# Patient Record
Sex: Female | Born: 1965 | Hispanic: Yes
Health system: Midwestern US, Community
[De-identification: ages and names within clinical notes are randomized; demographics above are authoritative.]

## PROBLEM LIST (undated history)

## (undated) DIAGNOSIS — F1020 Alcohol dependence, uncomplicated: Secondary | ICD-10-CM

## (undated) DIAGNOSIS — F319 Bipolar disorder, unspecified: Secondary | ICD-10-CM

## (undated) DIAGNOSIS — F4310 Post-traumatic stress disorder, unspecified: Secondary | ICD-10-CM

## (undated) DIAGNOSIS — Z9884 Bariatric surgery status: Secondary | ICD-10-CM

## (undated) DIAGNOSIS — F32A Depression, unspecified: Secondary | ICD-10-CM

## (undated) DIAGNOSIS — G5603 Carpal tunnel syndrome, bilateral upper limbs: Secondary | ICD-10-CM

## (undated) DIAGNOSIS — F329 Major depressive disorder, single episode, unspecified: Secondary | ICD-10-CM

## (undated) DIAGNOSIS — F419 Anxiety disorder, unspecified: Secondary | ICD-10-CM

## (undated) DIAGNOSIS — F3181 Bipolar II disorder: Secondary | ICD-10-CM

## (undated) HISTORY — PX: GASTRIC BYPASS: SHX52

---

## 2009-02-15 ENCOUNTER — Ambulatory Visit

## 2009-02-15 LAB — AMB POC EKG ROUTINE W/ 12 LEADS, INTER & REP

## 2009-02-15 NOTE — Progress Notes (Signed)
Here for well woman exam no other complaints

## 2009-02-15 NOTE — Progress Notes (Signed)
HISTORY OF PRESENT ILLNESS  Rhonda Warner is a 43 y.o. female.  HPI  SUBJECTIVE:   43 y.o. female for annual wellness exam.  Additional Concerns: needs labs drawn and EKG performed for prescription monitoring for psychiatrist     Past pap smear results: normal   Past Mammograms: normal 3 years ago  Patient's last menstrual period was 02/02/2009.    Dental: due  Immunizations: Tdap due  Safe at home: yes       Cardiac Risk Factors  Age > 45-female, > 55-female:  NO  Smoking:     NO  Sig. family hx of CHD*:  NO  Hypertension:     NO  Diabetes:      NO        Social History: single partner.    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 ziprasidone (GEODON) 40 mg capsule Take 40 mg by mouth two (2) times daily (with meals).       ??? hydrOXYzine (VISTARIL) 25 mg capsule Take 25 mg by mouth three (3) times daily as needed.       ??? lamotrigine (LAMICTAL) 200 mg tablet Take  by mouth daily.       ??? venlafaxine-SR (EFFEXOR XR) 150 mg capsule Take  by mouth daily.           No Known Allergies  Past Medical History   Diagnosis Date   ??? Bipolar 2 disorder    ??? 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 Hx gastric bypass 01/2002   ??? Hx carpal tunnel release 09/1997       Family History   Problem Relation   ??? Alcohol abuse Mother   ??? Cancer Neg Hx       History   Substance Use Topics   ??? Tobacco Use: Never   ??? Alcohol Use: No          Review of Systems   Constitutional: Negative for malaise/fatigue.    ROS:  Feeling well. No dyspnea or chest pain on exertion.  No abdominal pain, change in bowel habits, black or bloody stools.  No urinary tract symptoms. GYN ROS: normal menses, no abnormal bleeding, pelvic pain or discharge, no breast pain or new or enlarging lumps on self exam. No neurological complaints.    BP 149/101   Pulse 80   Temp(Src) 98.1 ??F (36.7 ??C) (Oral)   Resp 18   Ht 5\' 6"  (1.676 m)   Wt 250 lb (113.399 kg)   LMP 02/02/2009   Physical Exam   Constitutional: She is oriented to person, place, and time. She appears well-developed. No distress.        Pleasant morbidly obese white female   HENT:   Head: Normocephalic and atraumatic.   Eyes: Conjunctivae and extraocular motions are normal.   Neck: Normal range of motion. Neck supple. No thyromegaly present.   Cardiovascular:  Normal rate, regular rhythm and normal heart sounds.  Exam reveals no gallop and no friction rub.    No murmur heard.  Pulmonary/Chest: Effort normal and breath sounds normal. No respiratory distress. She has no wheezes. She has no rales. Right breast exhibits no inverted nipple, no mass, no nipple discharge, no skin change and no tenderness. Left breast exhibits no inverted nipple, no mass, no nipple discharge, no skin change and no tenderness. Breasts are symmetrical.   Abdominal: Soft. Bowel sounds are normal. No tenderness.        Limited by habitus   Genitourinary: Vagina normal. Pelvic exam was performed with patient supine. No labial fusion. There is no lesion on the right labia. There is no lesion on the left labia. Uterus is not tender. Cervix exhibits no motion tenderness, no discharge and no friability. No erythema or bleeding around the vagina. No discharge found.        Bimanual exam was performed but extremely limited by habitus   Lymphadenopathy:     She has no cervical adenopathy.     She has no axillary adenopathy.        Right: No inguinal adenopathy present.        Left: No inguinal adenopathy present.    Neurological: She is alert and oriented to person, place, and time.   Psychiatric: She has a normal mood and affect. Her behavior is normal. Judgment and thought content normal.     EKG today is normal  ASSESSMENT and PLAN  1. Well woman exam (V70.0L)  MAM MAMMOGRAM SCREENING DIGITAL BILAT, PAP, LIQUID BASED, IMAGE PROCESSED, LIPID PANEL, METABOLIC PANEL, COMPREHENSIVE, VITAMIN D, 25 HYDROXY   2. Morbid obesity (278.01)  LIPID PANEL, METABOLIC PANEL, COMPREHENSIVE, VITAMIN D, 25 HYDROXY, TSH W/ REFLX FREE T4 IF ABNORMAL   3. S/P gastric bypass (V45.86N)  TSH W/ REFLX FREE T4 IF ABNORMAL, VITAMIN B12 & FOLATE, IRON PROFILE, FERRITIN   4. Need for Tdap vaccination (V06.1P)     5. Bipolar 2 disorder (296.63M)  ziprasidone (GEODON) 40 mg capsule, hydrOXYzine (VISTARIL) 25 mg capsule, LIPID PANEL, METABOLIC PANEL, COMPREHENSIVE, CBC W/O DIFF, VITAMIN D, 25 HYDROXY, TSH W/ REFLX FREE T4 IF ABNORMAL, AMB POC EKG ROUTINE W/ 12 LEADS, INTER & REP   6. Elevated blood pressure (796.2S)  No prior history.  Reassess next visit.     The patient understands our medical plan.  Alternatives have been explained and offered.   All questions have been answered.  She is encouraged to visit [facility] for additional information using the codes listed in the after visit summary, which was reviewed.      Follow-up Disposition:  Return in about 2 weeks (around 03/01/2009) for blood pressure recheck and lab follow up.

## 2009-02-15 NOTE — Patient Instructions (Addendum)
Please review the labs ordered compared to those requested by your psychiatrist.  We may be able to add any that have been overlooked with a phone call.    Please call in your psychiatrist's contact information for [place] to fax your results.    Schedule a dental examination.[place]   Spanish  Well Visit???Ages 5 to 29: After Your Visit  Your Care Instructions  Physical exams can help you stay healthy. Your doctor has checked your overall health and may have suggested ways to take good care of yourself. He or she also may have recommended tests. At home, you can help prevent illness with healthy eating, regular exercise, and other steps.  Follow-up care is a key part of your treatment and safety. Be sure to make and go to all appointments, and call your doctor if you are having problems. It???s also a good idea to know your test results and keep a list of the medicines you take.  How can you care for yourself at home?  ?? Reach and stay at a healthy weight. This will lower your risk for many problems, such as obesity, diabetes, heart disease, and high blood pressure.   ?? Get at least 30 minutes of physical activity on most days of the week. Walking is a good choice. You also may want to do other activities, such as running, swimming, cycling, or playing tennis or team sports. Discuss any changes in your exercise program with your doctor.   ?? Do not smoke or allow others to smoke around you. If you need help quitting, talk to your doctor about stop-smoking programs and medicines. These can increase your chances of quitting for good.   ?? Talk to your doctor about whether you have any risk factors for sexually transmitted diseases (STDs). Having one sex partner (who does not have STDs and does not have sex with anyone else) is a good way to avoid these diseases.   ?? Use birth control if you do not want to have children at this time. Talk with your doctor about the choices available and what might be best for you.    ?? Always wear sunscreen on exposed skin. Make sure the sunscreen blocks ultraviolet rays (both UVA and UVB) and has a sun protection factor (SPF) of at least 15. Use it every day, even when it is cloudy. Some doctors may recommend a higher SPF, such as 30.   ?? See a dentist one or two times a year for checkups and to have your teeth cleaned.   ?? Wear a seat belt in the car.   ?? Drink alcohol in moderation, if at all. That means no more than 2 drinks a day for men and 1 drink a day for women.  Follow your doctor's advice about when to have certain tests. These tests can spot problems early.  For everyone  ?? Cholesterol. Have the fat (cholesterol) in your blood tested after age 86. Your doctor will tell you how often to have this done based on your age, family history, or other things that can increase your risk for heart disease.   ?? Blood pressure. Experts suggest that healthy adults with normal blood pressure (119/79 mm Hg or below) have their blood pressure checked at least every 1 to 2 years. This can be done during a routine doctor visit. If you have slightly higher or high blood pressure, your doctor will suggest more frequent tests.   ?? Vision. Have your eyes checked every  year or two or as often as your doctor suggests. Some experts recommend that you have yearly exams for glaucoma and other age-related eye problems starting at age 22.   ?? Diabetes. Ask your doctor whether you should have tests for diabetes.   ?? Colon cancer. Have a test for colon cancer at age 14. You may have one of several tests. If you are younger than 33, you may need a test earlier if you have any risk factors. Risk factors include whether you already had a precancerous polyp removed from your colon or whether your parent, brother, sister, or child has had colon cancer.  For women   ?? Breast exam and mammogram. Talk to your doctor about when you should have a clinical breast exam and a mammogram. Medical experts differ on whether and how often women under 50 should have these tests. Your doctor can help you decide what is right for you.   ?? Pap test and pelvic exam. Begin Pap tests 3 years from the time you first have sexual intercourse, but no later than age 51. A Pap test is the best way to find cervical cancer. The test often is part of a pelvic exam. Ask how often to have this test.   ?? Tests for sexually transmitted diseases (STDs). Ask whether you should have tests for STDs. You may be at risk if you have sex with more than one person, especially if your partners do not wear condoms.  For men  ?? Tests for sexually transmitted diseases (STDs). Ask whether you should have tests for STDs. You may be at risk if you have sex with more than one person, especially if you do not wear a condom.   ?? Testicular cancer exam. Ask your doctor whether you should check your testicles regularly.   ?? Prostate exam. Talk to your doctor about whether you should have a blood test (called a PSA test) for prostate cancer. Experts differ on whether and when men should have this test. Some experts suggest it if you are older than 49 and are African-American or have a father or brother who got prostate cancer when he was younger than 59.  When should you call for help?  Watch closely for changes in your health, and be sure to contact your doctor if:  ?? You have any problems or symptoms that concern you.   ?? You want more information on taking care of yourself.   Where can you learn more?   Go to [email or web address].  Enter P072 in the search box to learn more about "Well Visit???Ages 61 to 14: After Your Visit."    ?? 2006-2010 [employer]. Care instructions adapted under license by [employer] (which disclaims liability or warranty for this information). This care instruction is for use with your licensed healthcare professional. If you have questions about a medical condition or this instruction, always ask your healthcare professional. Healthwise disclaims any warranty or liability for your use of this information.      Please call the clinic if you haven't received a call with the results of your tests or with an appointment plan for any referrals within one week.  No news is not good news; it's no news.     Please review our list of your current medications (name, formulation, strength and dosing instructions) and allergies and call us if there is an error.      Please bring all of your over the counter and herbal supplements to  your next visit if you have not already done so.

## 2009-02-16 LAB — METABOLIC PANEL, COMPREHENSIVE
A-G Ratio: 1.3 (ref 0.8–1.7)
ALT (SGPT): 35 U/L (ref 30–65)
AST (SGOT): 28 U/L (ref 15–37)
Albumin: 3.9 g/dL (ref 3.4–5.0)
Alk. phosphatase: 112 U/L (ref 50–136)
Anion gap: 8 mmol/L (ref 5–15)
BUN/Creatinine ratio: 12 (ref 12–20)
BUN: 11 MG/DL (ref 7–18)
Bilirubin, total: 0.5 MG/DL (ref 0.1–0.9)
CO2: 29 MMOL/L (ref 21–32)
Calcium: 8.8 MG/DL (ref 8.4–10.4)
Chloride: 98 MMOL/L — ABNORMAL LOW (ref 100–108)
Creatinine: 0.9 MG/DL (ref 0.6–1.3)
GFR est AA: >60 mL/min/{1.73_m2} (ref >60)
GFR est non-AA: >60 mL/min/{1.73_m2} (ref >60)
Globulin: 3.1 g/dL (ref 2.0–4.0)
Glucose: 108 MG/DL — ABNORMAL HIGH (ref 74–99)
Potassium: 4.2 MMOL/L (ref 3.5–5.5)
Protein, total: 7 g/dL (ref 6.4–8.2)
Sodium: 135 MMOL/L — ABNORMAL LOW (ref 136–145)

## 2009-02-16 LAB — CBC W/O DIFF
HCT: 37 % (ref 36.0–46.0)
HGB: 12.2 g/dL (ref 12.0–16.0)
MCH: 28.7 PG (ref 25.0–35.0)
MCHC: 33.1 g/dL (ref 31.0–37.0)
MCV: 86.6 FL (ref 78.0–102.0)
MPV: 7.3 FL — ABNORMAL LOW (ref 7.4–10.4)
PLATELET: 253 10*3/uL (ref 130–400)
RBC: 4.26 M/uL (ref 4.10–5.10)
RDW: 17.5 % — ABNORMAL HIGH (ref 11.5–14.5)
WBC: 6.4 10*3/uL (ref 4.5–13.0)

## 2009-02-16 LAB — TSH W/ REFLX FREE T4 IF ABNORMAL: TSH: 1.86 u[IU]/mL (ref 0.51–6.27)

## 2009-02-16 LAB — LIPID PANEL
CHOL/HDL Ratio: 2.2 (ref 0–5.0)
Cholesterol, total: 185 MG/DL (ref 0–200)
HDL Cholesterol: 85 MG/DL — ABNORMAL HIGH (ref 40–60)
LDL, calculated: 71.8 MG/DL (ref 0–100)
LDL/HDL Ratio: 0.8
Triglyceride: 141 MG/DL (ref 0–150)
VLDL, calculated: 28.2 MG/DL

## 2009-02-16 LAB — IRON PROFILE
Iron % saturation: 30 % (ref 20–50)
Iron: 131 ug/dL (ref 35–150)
TIBC: 444 ug/dL (ref 250–450)

## 2009-02-16 LAB — VITAMIN B12 & FOLATE
Folate: >24 ng/mL — ABNORMAL HIGH (ref 5.38–24.0)
Vitamin B12: 404 pg/mL (ref 211–911)

## 2009-02-16 LAB — FERRITIN: Ferritin: 20 NG/ML (ref 10–291)

## 2009-02-17 LAB — VITAMIN D, 25 HYDROXY: Vitamin D 25-Hydroxy: 23 ng/mL — ABNORMAL LOW (ref 30–80)

## 2009-02-20 NOTE — Progress Notes (Signed)
Quick Note:    I see that she has an appointment to see me on 7/30. Please fax a copy of labs to her psychiatrist. Please fax her EKG as well if that's not already been done. Thank you.  ______

## 2014-07-12 ENCOUNTER — Encounter (HOSPITAL_COMMUNITY): Payer: Self-pay | Admitting: Emergency Medicine

## 2014-07-12 ENCOUNTER — Emergency Department (EMERGENCY_DEPARTMENT_HOSPITAL)
Admission: EM | Admit: 2014-07-12 | Discharge: 2014-07-13 | Disposition: A | Discharge status: Other Acute Inpatient Hospital | Payer: Medicare Other | Source: Home / Self Care | Attending: Emergency Medicine | Admitting: Emergency Medicine

## 2014-07-12 DIAGNOSIS — F319 Bipolar disorder, unspecified: Secondary | ICD-10-CM | POA: Insufficient documentation

## 2014-07-12 DIAGNOSIS — Z79899 Other long term (current) drug therapy: Secondary | ICD-10-CM

## 2014-07-12 DIAGNOSIS — F314 Bipolar disorder, current episode depressed, severe, without psychotic features: Secondary | ICD-10-CM

## 2014-07-12 DIAGNOSIS — F10129 Alcohol abuse with intoxication, unspecified: Secondary | ICD-10-CM | POA: Insufficient documentation

## 2014-07-12 DIAGNOSIS — F10920 Alcohol use, unspecified with intoxication, uncomplicated: Secondary | ICD-10-CM

## 2014-07-12 DIAGNOSIS — Z3202 Encounter for pregnancy test, result negative: Secondary | ICD-10-CM | POA: Insufficient documentation

## 2014-07-12 HISTORY — DX: Depression, unspecified: F32.A

## 2014-07-12 HISTORY — DX: Alcohol dependence, uncomplicated: F10.20

## 2014-07-12 HISTORY — DX: Major depressive disorder, single episode, unspecified: F32.9

## 2014-07-12 HISTORY — DX: Bipolar disorder, unspecified: F31.9

## 2014-07-12 LAB — COMPREHENSIVE METABOLIC PANEL
ALK PHOS: 100 U/L (ref 39–117)
ALT: 20 U/L (ref 0–35)
ANION GAP: 15 (ref 5–15)
AST: 38 U/L — ABNORMAL HIGH (ref 0–37)
Albumin: 3.2 g/dL — ABNORMAL LOW (ref 3.5–5.2)
BUN: 10 mg/dL (ref 6–23)
CALCIUM: 8.7 mg/dL (ref 8.4–10.5)
CO2: 29 mEq/L (ref 19–32)
Chloride: 102 mEq/L (ref 96–112)
Creatinine, Ser: 0.56 mg/dL (ref 0.50–1.10)
GFR calc Af Amer: >90 mL/min (ref >90)
GFR calc non Af Amer: >90 mL/min (ref >90)
Glucose, Bld: 112 mg/dL — ABNORMAL HIGH (ref 70–99)
Potassium: 4.3 mEq/L (ref 3.7–5.3)
Sodium: 146 mEq/L (ref 137–147)
TOTAL PROTEIN: 6.9 g/dL (ref 6.0–8.3)
Total Bilirubin: 0.2 mg/dL — ABNORMAL LOW (ref 0.3–1.2)

## 2014-07-12 LAB — RAPID URINE DRUG SCREEN, HOSP PERFORMED
Amphetamines: NOT DETECTED
Barbiturates: NOT DETECTED
Benzodiazepines: NOT DETECTED
Cocaine: NOT DETECTED
OPIATES: NOT DETECTED
TETRAHYDROCANNABINOL: NOT DETECTED

## 2014-07-12 LAB — CBC
HEMATOCRIT: 42.8 % (ref 36.0–46.0)
HEMOGLOBIN: 13.8 g/dL (ref 12.0–15.0)
MCH: 31 pg (ref 26.0–34.0)
MCHC: 32.2 g/dL (ref 30.0–36.0)
MCV: 96.2 fL (ref 78.0–100.0)
Platelets: 291 10*3/uL (ref 150–400)
RBC: 4.45 MIL/uL (ref 3.87–5.11)
RDW: 14.2 % (ref 11.5–15.5)
WBC: 8.8 10*3/uL (ref 4.0–10.5)

## 2014-07-12 LAB — ETHANOL: ALCOHOL ETHYL (B): 400 mg/dL — AB (ref 0–11)

## 2014-07-12 LAB — PREGNANCY, URINE: Preg Test, Ur: NEGATIVE

## 2014-07-12 LAB — SALICYLATE LEVEL

## 2014-07-12 LAB — ACETAMINOPHEN LEVEL: Acetaminophen (Tylenol), Serum: <15 ug/mL (ref 10–30)

## 2014-07-12 MED ORDER — NICOTINE 21 MG/24HR TD PT24
21.0000 mg | MEDICATED_PATCH | Freq: Every day | TRANSDERMAL | Status: DC
  Filled 2014-07-12: qty 1

## 2014-07-12 MED ORDER — GABAPENTIN 300 MG PO CAPS
300.0000 mg | ORAL_CAPSULE | Freq: Three times a day (TID) | ORAL | Status: DC
  Administered 2014-07-12 – 2014-07-13 (×3): 300 mg via ORAL
  Filled 2014-07-12 (×3): qty 1

## 2014-07-12 MED ORDER — ACETAMINOPHEN 325 MG PO TABS: 650.0000 mg | ORAL_TABLET | ORAL | Status: DC | PRN

## 2014-07-12 MED ORDER — LORAZEPAM 1 MG PO TABS: 0.0000 mg | ORAL_TABLET | Freq: Two times a day (BID) | ORAL | Status: DC

## 2014-07-12 MED ORDER — PRAZOSIN HCL 1 MG PO CAPS
1.0000 mg | ORAL_CAPSULE | Freq: Every day | ORAL | Status: DC
  Administered 2014-07-12: 1 mg via ORAL
  Filled 2014-07-12 (×2): qty 1

## 2014-07-12 MED ORDER — FLUOXETINE HCL 20 MG PO CAPS
20.0000 mg | ORAL_CAPSULE | Freq: Every day | ORAL | Status: DC
  Administered 2014-07-13: 20 mg via ORAL
  Filled 2014-07-12: qty 1

## 2014-07-12 MED ORDER — VALPROIC ACID 250 MG/5ML PO SYRP
125.0000 mg | ORAL_SOLUTION | Freq: Every day | ORAL | Status: DC
  Administered 2014-07-12: 125 mg via ORAL
  Filled 2014-07-12 (×2): qty 2.5

## 2014-07-12 MED ORDER — LITHIUM CITRATE 300 MG/5 ML PO SYRP
900.0000 mg | Freq: Every day | ORAL | Status: DC
  Administered 2014-07-12: 900 mg via ORAL
  Filled 2014-07-12 (×2): qty 15

## 2014-07-12 MED ORDER — ONDANSETRON HCL 4 MG PO TABS: 4.0000 mg | ORAL_TABLET | Freq: Three times a day (TID) | ORAL | Status: DC | PRN

## 2014-07-12 MED ORDER — LORAZEPAM 1 MG PO TABS
0.0000 mg | ORAL_TABLET | Freq: Four times a day (QID) | ORAL | Status: DC
  Administered 2014-07-12: 1 mg via ORAL
  Administered 2014-07-13 (×2): 2 mg via ORAL
  Filled 2014-07-12: qty 1
  Filled 2014-07-12 (×2): qty 2

## 2014-07-12 MED ORDER — LITHIUM CITRATE 300 MG/5 ML PO SYRP
15.0000 mg | Freq: Every day | ORAL | Status: DC
  Filled 2014-07-12: qty 0.25
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
## 2014-07-12 NOTE — ED Notes (Signed)
Contact # Jens SomJohn Brown [phone]
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
## 2014-07-12 NOTE — BH Assessment (Signed)
Assessment completed. Consulted Verne SpurrNeil Mashburn, PA-C who recommended that pt be re-evaluated in the am. Antony MaduraKelly Humes, PA-C has been informed of the recommendation.

## 2014-07-13 ENCOUNTER — Encounter (HOSPITAL_COMMUNITY): Payer: Self-pay

## 2014-07-13 ENCOUNTER — Inpatient Hospital Stay (HOSPITAL_COMMUNITY)
Admission: AD | Admit: 2014-07-13 | Discharge: 2014-07-18 | DRG: 885 | Disposition: A | Discharge status: Home / Self Care | Payer: Medicare Other | Source: Intra-hospital | Attending: Psychiatry | Admitting: Psychiatry

## 2014-07-13 DIAGNOSIS — F314 Bipolar disorder, current episode depressed, severe, without psychotic features: Principal | ICD-10-CM | POA: Diagnosis present

## 2014-07-13 DIAGNOSIS — F1099 Alcohol use, unspecified with unspecified alcohol-induced disorder: Secondary | ICD-10-CM

## 2014-07-13 DIAGNOSIS — F10229 Alcohol dependence with intoxication, unspecified: Secondary | ICD-10-CM | POA: Diagnosis present

## 2014-07-13 DIAGNOSIS — R45851 Suicidal ideations: Secondary | ICD-10-CM

## 2014-07-13 DIAGNOSIS — R51 Headache: Secondary | ICD-10-CM | POA: Diagnosis not present

## 2014-07-13 DIAGNOSIS — F1020 Alcohol dependence, uncomplicated: Secondary | ICD-10-CM | POA: Diagnosis present

## 2014-07-13 DIAGNOSIS — Z91410 Personal history of adult physical and sexual abuse: Secondary | ICD-10-CM | POA: Diagnosis not present

## 2014-07-13 DIAGNOSIS — W19XXXA Unspecified fall, initial encounter: Secondary | ICD-10-CM | POA: Diagnosis not present

## 2014-07-13 DIAGNOSIS — F4310 Post-traumatic stress disorder, unspecified: Secondary | ICD-10-CM | POA: Diagnosis present

## 2014-07-13 DIAGNOSIS — M542 Cervicalgia: Secondary | ICD-10-CM

## 2014-07-13 DIAGNOSIS — F10929 Alcohol use, unspecified with intoxication, unspecified: Secondary | ICD-10-CM | POA: Insufficient documentation

## 2014-07-13 DIAGNOSIS — Y92231 Patient bathroom in hospital as the place of occurrence of the external cause: Secondary | ICD-10-CM | POA: Diagnosis not present

## 2014-07-13 DIAGNOSIS — S0083XA Contusion of other part of head, initial encounter: Secondary | ICD-10-CM | POA: Diagnosis not present

## 2014-07-13 MED ORDER — LITHIUM CITRATE 300 MG/5 ML PO SYRP
900.0000 mg | Freq: Every day | ORAL | Status: DC
  Administered 2014-07-14 – 2014-07-17 (×4): 900 mg via ORAL
  Filled 2014-07-13: qty 45
  Filled 2014-07-13 (×7): qty 15

## 2014-07-13 MED ORDER — ONDANSETRON HCL 4 MG PO TABS
4.0000 mg | ORAL_TABLET | Freq: Three times a day (TID) | ORAL | Status: DC | PRN
  Filled 2014-07-13: qty 1

## 2014-07-13 MED ORDER — ACETAMINOPHEN 325 MG PO TABS
650.0000 mg | ORAL_TABLET | ORAL | Status: DC | PRN
  Administered 2014-07-15 – 2014-07-16 (×3): 650 mg via ORAL
  Filled 2014-07-13 (×4): qty 2

## 2014-07-13 MED ORDER — MAGNESIUM HYDROXIDE 400 MG/5ML PO SUSP
30.0000 mL | Freq: Every day | ORAL | Status: DC | PRN
  Filled 2014-07-13: qty 30

## 2014-07-13 MED ORDER — ACETAMINOPHEN 325 MG PO TABS: 650.0000 mg | ORAL_TABLET | Freq: Four times a day (QID) | ORAL | Status: DC | PRN

## 2014-07-13 MED ORDER — FLUOXETINE HCL 20 MG PO CAPS
20.0000 mg | ORAL_CAPSULE | Freq: Every day | ORAL | Status: DC
  Administered 2014-07-14 – 2014-07-18 (×5): 20 mg via ORAL
  Filled 2014-07-13 (×4): qty 1
  Filled 2014-07-13: qty 3
  Filled 2014-07-13 (×4): qty 1

## 2014-07-13 MED ORDER — LORAZEPAM 1 MG PO TABS
0.0000 mg | ORAL_TABLET | Freq: Two times a day (BID) | ORAL | Status: DC
  Administered 2014-07-14: 2 mg via ORAL

## 2014-07-13 MED ORDER — LORAZEPAM 1 MG PO TABS
0.0000 mg | ORAL_TABLET | Freq: Four times a day (QID) | ORAL | Status: DC
Start: 2014-07-14 — End: 2014-07-14
  Administered 2014-07-13: 2 mg via ORAL
  Filled 2014-07-13 (×2): qty 2

## 2014-07-13 MED ORDER — ALUM & MAG HYDROXIDE-SIMETH 200-200-20 MG/5ML PO SUSP
30.0000 mL | ORAL | Status: DC | PRN
  Filled 2014-07-13: qty 30

## 2014-07-13 MED ORDER — DIVALPROEX SODIUM 250 MG PO DR TAB
250.0000 mg | DELAYED_RELEASE_TABLET | Freq: Every day | ORAL | Status: DC
  Administered 2014-07-13: 250 mg via ORAL
  Filled 2014-07-13 (×5): qty 1

## 2014-07-13 MED ORDER — GABAPENTIN 300 MG PO CAPS
300.0000 mg | ORAL_CAPSULE | Freq: Three times a day (TID) | ORAL | Status: DC
  Administered 2014-07-14 – 2014-07-18 (×14): 300 mg via ORAL
  Filled 2014-07-13 (×8): qty 1
  Filled 2014-07-13: qty 9
  Filled 2014-07-13 (×3): qty 1
  Filled 2014-07-13: qty 9
  Filled 2014-07-13 (×5): qty 1
  Filled 2014-07-13: qty 9
  Filled 2014-07-13 (×5): qty 1

## 2014-07-13 MED ORDER — TRAZODONE HCL 100 MG PO TABS
100.0000 mg | ORAL_TABLET | Freq: Every day | ORAL | Status: DC
  Administered 2014-07-13 – 2014-07-17 (×4): 100 mg via ORAL
  Filled 2014-07-13 (×2): qty 1
  Filled 2014-07-13: qty 3
  Filled 2014-07-13 (×6): qty 1

## 2014-07-13 MED ORDER — PRAZOSIN HCL 1 MG PO CAPS
1.0000 mg | ORAL_CAPSULE | Freq: Every day | ORAL | Status: DC
  Administered 2014-07-13 – 2014-07-17 (×4): 1 mg via ORAL
  Filled 2014-07-13 (×2): qty 1
  Filled 2014-07-13: qty 3
  Filled 2014-07-13 (×6): qty 1
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
## 2014-07-13 NOTE — Tx Team (Signed)
Initial Interdisciplinary Treatment Plan   PATIENT STRESSORS: Marital or family conflict Substance abuse   PATIENT STRENGTHS: Capable of independent living Motivation for treatment/growth   PROBLEM LIST: Problem List/Patient Goals Date to be addressed Date deferred Reason deferred Estimated date of resolution  Depression with suicidal ideation       EToh dependence                                                 DISCHARGE CRITERIA:  Improved stabilization in mood, thinking, and/or behavior Need for constant or close observation no longer present Withdrawal symptoms are absent or subacute and managed without 24-hour nursing intervention  PRELIMINARY DISCHARGE PLAN: Attend 12-step recovery group Return to previous living arrangement  PATIENT/FAMIILY INVOLVEMENT: This treatment plan has been presented to and reviewed with the patient, Mary Harper, and/or family member, .  The patient and family have been given the opportunity to ask questions and make suggestions.  Mary Harper, Mary Harper 07/13/2014, 11:24 PM

## 2014-07-13 NOTE — ED Notes (Signed)
Pt transported to [facility] by [facility] transportation service for continuation of specialized care. She left in no acute distress. 

## 2014-07-13 NOTE — ED Notes (Signed)
Boyd KerbsPenny, RN [facility] at [facility]

## 2014-07-14 ENCOUNTER — Inpatient Hospital Stay (HOSPITAL_COMMUNITY): Payer: Medicare Other

## 2014-07-14 ENCOUNTER — Encounter (HOSPITAL_COMMUNITY): Payer: Self-pay | Admitting: Emergency Medicine

## 2014-07-14 DIAGNOSIS — F1020 Alcohol dependence, uncomplicated: Secondary | ICD-10-CM | POA: Diagnosis present

## 2014-07-14 DIAGNOSIS — F1099 Alcohol use, unspecified with unspecified alcohol-induced disorder: Secondary | ICD-10-CM

## 2014-07-14 DIAGNOSIS — F3130 Bipolar disorder, current episode depressed, mild or moderate severity, unspecified: Secondary | ICD-10-CM

## 2014-07-14 MED ORDER — LORAZEPAM 1 MG PO TABS: 1.0000 mg | ORAL_TABLET | Freq: Four times a day (QID) | ORAL | Status: AC | PRN

## 2014-07-14 MED ORDER — LORAZEPAM 1 MG PO TABS
1.0000 mg | ORAL_TABLET | Freq: Two times a day (BID) | ORAL | Status: AC
  Administered 2014-07-16 (×2): 1 mg via ORAL
  Filled 2014-07-14 (×2): qty 1

## 2014-07-14 MED ORDER — THIAMINE HCL 100 MG/ML IJ SOLN
100.0000 mg | Freq: Once | INTRAMUSCULAR | Status: AC
  Administered 2014-07-14: 100 mg via INTRAMUSCULAR

## 2014-07-14 MED ORDER — LOPERAMIDE HCL 2 MG PO CAPS
2.0000 mg | ORAL_CAPSULE | ORAL | Status: AC | PRN
  Filled 2014-07-14: qty 2

## 2014-07-14 MED ORDER — ADULT MULTIVITAMIN W/MINERALS CH
1.0000 | ORAL_TABLET | Freq: Every day | ORAL | Status: DC
  Administered 2014-07-14 – 2014-07-18 (×5): 1 via ORAL
  Filled 2014-07-14 (×8): qty 1

## 2014-07-14 MED ORDER — ONDANSETRON 4 MG PO TBDP
4.0000 mg | ORAL_TABLET | Freq: Four times a day (QID) | ORAL | Status: AC | PRN
  Administered 2014-07-14: 4 mg via ORAL
  Filled 2014-07-14 (×2): qty 1

## 2014-07-14 MED ORDER — LORAZEPAM 1 MG PO TABS
1.0000 mg | ORAL_TABLET | Freq: Four times a day (QID) | ORAL | Status: AC
  Administered 2014-07-14 (×4): 1 mg via ORAL
  Filled 2014-07-14 (×4): qty 1

## 2014-07-14 MED ORDER — VALPROIC ACID 250 MG/5ML PO SYRP
750.0000 mg | ORAL_SOLUTION | Freq: Every day | ORAL | Status: DC
  Administered 2014-07-14 – 2014-07-17 (×4): 750 mg via ORAL
  Filled 2014-07-14 (×5): qty 15
  Filled 2014-07-14: qty 45

## 2014-07-14 MED ORDER — LORAZEPAM 1 MG PO TABS
1.0000 mg | ORAL_TABLET | Freq: Three times a day (TID) | ORAL | Status: AC
  Administered 2014-07-15 (×3): 1 mg via ORAL
  Filled 2014-07-14 (×3): qty 1

## 2014-07-14 MED ORDER — HYDROXYZINE HCL 25 MG PO TABS
25.0000 mg | ORAL_TABLET | Freq: Four times a day (QID) | ORAL | Status: AC | PRN
  Filled 2014-07-14: qty 1

## 2014-07-14 MED ORDER — ACETAMINOPHEN 325 MG PO TABS
650.0000 mg | ORAL_TABLET | Freq: Once | ORAL | Status: DC
  Filled 2014-07-14: qty 2

## 2014-07-14 MED ORDER — VITAMIN B-1 100 MG PO TABS
100.0000 mg | ORAL_TABLET | Freq: Every day | ORAL | Status: DC
  Administered 2014-07-15 – 2014-07-18 (×4): 100 mg via ORAL
  Filled 2014-07-14 (×7): qty 1

## 2014-07-14 MED ORDER — LORAZEPAM 1 MG PO TABS
1.0000 mg | ORAL_TABLET | Freq: Every day | ORAL | Status: AC
  Administered 2014-07-17: 1 mg via ORAL
  Filled 2014-07-14: qty 1

## 2014-07-14 MED ORDER — ACETAMINOPHEN 325 MG PO TABS
650.0000 mg | ORAL_TABLET | Freq: Once | ORAL | Status: AC
  Administered 2014-07-14: 650 mg via ORAL

## 2014-07-14 NOTE — Progress Notes (Signed)
Psychoeducational Group Note  Date:  07/14/2014 Time:  2336  Group Topic/Focus:  Wrap-Up Group:   The focus of this group is to help patients review their daily goal of treatment and discuss progress on daily workbooks.  Participation Level: Did Not Attend  Participation Quality:  Not Applicable  Affect:  Not Applicable  Cognitive:  Not Applicable  Insight:  Not Applicable  Engagement in Group: Not Applicable  Additional Comments:  The patient did not attend group this evening since she was asleep in her bedroom.   Hazle CocaGOODMAN, Ailynn Gow S 07/14/2014, 11:36 PM

## 2014-07-14 NOTE — [facility] Suicide Risk Assessment (Signed)
Suicide Risk Assessment  Admission Assessment     Nursing information obtained from:    Demographic factors:    Current Mental Status:    Loss Factors:    Historical Factors:    Risk Reduction Factors:    Total Time spent with patient: 45 minutes  CLINICAL FACTORS:   Bipolar Disorder:   Depressive phase Alcohol/Substance Abuse/Dependencies  Psychiatric Specialty Exam:     Blood pressure 127/84, pulse 88, temperature 98 F (36.7 C), temperature source Oral, resp. rate 16, height 5\' 5"  (1.651 m), weight 92.987 kg (205 lb), last menstrual period 07/04/2014, SpO2 96 %.Body mass index is 34.11 kg/(m^2).  General Appearance: Fairly Groomed  Patent attorneyye Contact::  Fair  Speech:  Clear and Coherent  Volume:  Decreased  Mood:  Depressed  Affect:  Restricted  Thought Process:  Coherent and Goal Directed  Orientation:  Full (Time, Place, and Person)  Thought Content:  symptoms events worries concerns  Suicidal Thoughts:  Yes.  without intent/plan  Homicidal Thoughts:  No  Memory:  Immediate;   Fair Recent;   Fair Remote;   Fair  Judgement:  Fair  Insight:  Present  Psychomotor Activity:  Restlessness  Concentration:  Fair  Recall:  [employer]Fair  Fund of Knowledge:NA  Language: Fair  Akathisia:  No  Handed:    AIMS (if indicated):     Assets:  Desire for Improvement Housing Social Support  Sleep:  Number of Hours: 6   Musculoskeletal: Strength & Muscle Tone: within normal limits Gait & Station: normal Patient leans: N/A  COGNITIVE FEATURES THAT CONTRIBUTE TO RISK:  Closed-mindedness Polarized thinking Thought constriction (tunnel vision)    SUICIDE RISK:   Moderate:  Frequent suicidal ideation with limited intensity, and duration, some specificity in terms of plans, no associated intent, good self-control, limited dysphoria/symptomatology, some risk factors present, and identifiable protective factors, including available and accessible social support.  PLAN OF CARE: Supportive  approach/coping skills/relapsep prevention                               Detox as needed                               Reassess and address the co morbidities                               Optimize response to the psychotropics  I certify that inpatient services furnished can reasonably be expected to improve the patient's condition.  Leland Raver A 07/14/2014, 6:28 PM
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
## 2014-07-14 NOTE — Progress Notes (Signed)
Psychoeducational Group Note  Date:  07/14/2014 Time:  0140  Group Topic/Focus:  Recovery Goals:   The focus of this group is to identify appropriate goals for recovery and establish a plan to achieve them.  Participation Level: Did Not Attend  Participation Quality:  Not Applicable  Affect:  Not Applicable  Cognitive:  Not Applicable  Insight:  Not Applicable  Engagement in Group: Not Applicable  Additional Comments: The patient didn't attend the A.A. Meeting last evening since she had yet to be admitted to hallway.   Mertha Clyatt S 07/14/2014, 1:40 AM

## 2014-07-14 NOTE — ED Notes (Signed)
Bed: WA17 Expected date:  Expected time:  Means of arrival:  Comments: Fall from [facility]

## 2014-07-14 NOTE — ED Provider Notes (Signed)
The patient is a 48 year old female, currently a patient at behavioral health, she states that she gets dizzy with the medications that she takes and when she stood up too quickly she got dizzy and fell bumping her head on the side of the sink, she has a small hematoma to the left lateral forehead, no temporal or parietal injuries, no focal neurologic complaints or deficits on exam, mild amount of posterior cervical tenderness on exam. She was immobilized in a cervical collar, has normal strength and sensation of all 4 extremities. She will need imaging of her neck, no indication for CT scan of the brain at this time. Patient should be stable to go back to behavioral health if negative.  Medical screening examination/treatment/procedure(s) were conducted as a shared visit with non-physician practitioner(s) and myself.  I personally evaluated the patient during the encounter.  Clinical Impression:   Final diagnoses:  Neck pain  Fall, initial encounter  Forehead contusion, initial encounter         Vida RollerBrian D Laray Corbit, MD 07/14/14 [phone]

## 2014-07-14 NOTE — ED Notes (Addendum)
[facility] with episode of dizziness resulting in fall to sink and hematoma to left forehead. Pt denies LOC, alert and oriented x4, and NSR en route with EMS.

## 2014-07-14 NOTE — ED Provider Notes (Signed)
CSN: [id number]     Arrival date & time 07/14/14  1428 History   First MD Initiated Contact with Patient 07/14/14 1444     Chief Complaint  Patient presents with  . Fall   HPI  Patient is a [age] her old female who presents emergency room via EMS from [facility] for evaluation of a fall. Patient states that after lunch she was standing up from a toilet and got dizzy and slipped and fell and hit her left temple on the sink. She states that she never lost consciousness. Since falling she has had a headache and on scene EMS evaluated her neck and she had some neck pain. Patient was placed in a c-collar at the scene. Patient states that she gets dizzy spells when she stands up. She was told that it was related to her medications. Patient states that his dizzy spells not vertigo leg. She states that she felt woozy and swimmy headed. She states she is normally very careful about this, but got up and forgot. She states that she is not aware of any changes in her vision, but she normally wears glasses so her vision is currently blurry as if she does not have them on.  Past Medical History  Diagnosis Date  . Depression   . Bipolar 1 disorder   . Alcoholism    History reviewed. No pertinent past surgical history. History reviewed. No pertinent family history. History  Substance Use Topics  . Smoking status: Never Smoker   . Smokeless tobacco: Not on file  . Alcohol Use: Yes   OB History    No data available     Review of Systems  Constitutional: Negative for fever, chills and fatigue.  Eyes: Negative for photophobia and visual disturbance.  Respiratory: Negative for chest tightness and shortness of breath.   Cardiovascular: Negative for chest pain and palpitations.  Gastrointestinal: Negative for nausea, vomiting, abdominal pain, diarrhea and constipation.  Genitourinary: Negative for dysuria, urgency, frequency, hematuria and difficulty urinating.  Skin: Negative for rash.   Neurological: Positive for headaches. Negative for numbness.  Psychiatric/Behavioral: Negative for confusion.      Allergies  Review of patient's allergies indicates no known allergies.  Home Medications   Prior to Admission medications   Medication Sig Start Date End Date Taking? Authorizing Provider  FLUoxetine (PROZAC) 20 MG capsule Take 20 mg by mouth daily.    Historical Provider, MD  gabapentin (NEURONTIN) 300 MG capsule Take 300 mg by mouth 3 (three) times daily.    Historical Provider, MD  lithium citrate 300 MG/5ML solution Take 900 mg by mouth at bedtime.     Historical Provider, MD  prazosin (MINIPRESS) 1 MG capsule Take 1 mg by mouth at bedtime.    Historical Provider, MD  Valproic Acid (DEPAKENE) 250 MG/5ML SYRP syrup Take 750 mg by mouth at bedtime.     Historical Provider, MD   BP 135/82 mmHg  Pulse 94  Temp(Src) 98 F (36.7 C) (Oral)  Resp 20  Ht 5\' 5"  (1.651 m)  Wt 205 lb (92.987 kg)  BMI 34.11 kg/m2  SpO2 97%  LMP 07/04/2014 Physical Exam  Constitutional: She is oriented to person, place, and time. She appears well-developed and well-nourished. No distress.  HENT:  Head: Normocephalic.  Mouth/Throat: No oropharyngeal exudate.  There is a hematoma and ecchymosis to the left lateral forehead. Patient has tenderness to palpation over the left side of the head.  Eyes: Conjunctivae and EOM are normal. Pupils  are equal, round, and reactive to light. No scleral icterus.  Neck: Neck supple. No JVD present. No thyromegaly present.  Patient has tenderness to palpation over the midline of the neck. She can actively rotate her neck 45 and in each direction.  Cardiovascular: Normal rate, regular rhythm, normal heart sounds and intact distal pulses.  Exam reveals no gallop and no friction rub.   No murmur heard. Pulmonary/Chest: Effort normal and breath sounds normal. No respiratory distress. She has no wheezes. She has no rales. She exhibits no tenderness.  Abdominal:  Soft. Bowel sounds are normal. She exhibits no distension and no mass. There is no tenderness. There is no rebound and no guarding.  Lymphadenopathy:    She has no cervical adenopathy.  Neurological: She is alert and oriented to person, place, and time.  Skin: Skin is warm and dry. She is not diaphoretic.  Psychiatric: She has a normal mood and affect. Her behavior is normal. Judgment and thought content normal.  Nursing note and vitals reviewed.   ED Course  Procedures (including critical care time) Labs Review Labs Reviewed - No data to display  Imaging Review Dg Cervical Spine Complete  07/14/2014   CLINICAL DATA:  Neck pain. Pt went to the cafeteria today for her lunch and came back shaking and upset stating "it is too noisy and there are too many people down there. I cannot stand it. Pt was shaking but was able to hold her cup of water steady. Pt then went to her room with this writer escorting her and was tucked into her bed. Pt then got up to go to the bathroom and states "I got up from the toilet to wash my hands and I got dizzy. My head went down and hit the sink. I then turned around with my back up again the shower and lowered myself to the floor. I called out for help, but no one heard me. I saw the bell and pulled it and then people came to help me".  EXAM: CERVICAL SPINE  4+ VIEWS  COMPARISON:  None.  FINDINGS: No fracture. No spondylolisthesis. Minor loss of disc height at C5-C6 with endplate spurring. Remaining disc spaces are well preserved. Obliques are somewhat suboptimal, vertically for the left neural foramina. Allowing for this, there is no evidence of significant neural foraminal narrowing.  Soft tissues are unremarkable.  IMPRESSION: No fracture or acute finding.  Mild C5-C6 disc degenerative change.   Electronically Signed   By: Amie Portlandavid  Ormond M.D.   On: 07/14/2014 16:03     EKG Interpretation   Date/Time:  Saturday July 14 2014 15:02:28 EST Ventricular Rate:   79 PR Interval:  154 QRS Duration: 98 QT Interval:  390 QTC Calculation: 447 R Axis:   12 Text Interpretation:  Sinus rhythm Probable left ventricular hypertrophy  Abnormal ekg No old tracing to compare Confirmed by MILLER  MD, BRIAN  [phone]([id number]) on 07/14/2014 3:09:56 PM      MDM   Final diagnoses:  Neck pain  Fall, initial encounter  Forehead contusion, initial encounter   Patient is a 48 year old female with bipolar disorder and alcohol abuse who presents emergency room for evaluation after a fall from [facility]. Physical exam reveals a hematoma of the left forehead with no focal neurological deficits. Patient has some midline neck tenderness, but is able to actively rotate the head 45 in each direction. Given the [place] head CT trauma level patient is low risk for intracranial abnormalities. Head CT  is not indicated at this time. X-ray of the neck is negative. EKG is sinus rhythm with possible left ventricular hypertrophy. No other abnormalities noted on EKG. Patient is stable for transfer back to the behavioral health Hospital. Recommend Tylenol or ibuprofen as needed for headaches. Patient to return for changes in behavior, intractable nausea and vomiting, or any other concerning symptoms. Patient stable for discharge. Patient seen by and discussed with Dr. Hyacinth Meeker.    Eben Burow, PA-C 07/14/14 1611  Vida Roller, MD 07/14/14 [phone]

## 2014-07-14 NOTE — Progress Notes (Signed)
D) Pt went to the cafeteria today for her lunch and came back shaking and upset stating "it is too noisy and there are too many people down there. I cannot stand it. Pt was shaking but was able to hold her cup of water steady. Pt then went to her room with this writer escorting her and was tucked into her bed. Pt then got up to go to the bathroom and states "I got up from the toilet to wash my hands and I got dizzy. My head went down and hit the sink. I then turned around with my back up again the shower and lowered myself to the floor. I called out for help, but no one heard me. I saw the bell and pulled it and then people came to help me". A) Mary ColonelAggie Nwoko, NP was notified and due to Pt. Hitting her head an order was written for Pt to go and be evaluated in the ED. EMS was called and Pt was taken to the ED for evaluation. R) Pt tearful stating "I am stupid. I cannot believe that I did such a stupid thing". Pt given reassurance. Denies SI and HI. Rates her depression, hopelessness and anxiety all at a 7.

## 2014-07-14 NOTE — Discharge Instructions (Signed)
Fall Prevention in Hospitals As a hospital patient, your condition and the treatments you receive can increase your risk for falls. Some additional risk factors for falls in a hospital include:  Being in an unfamiliar environment.  Being on bed rest.  Your surgery.  Taking certain medicines.  Your tubing requirements, such as intravenous (IV) therapy or catheters. It is important that you learn how to decrease fall risks while at the hospital. Below are important tips that can help prevent falls. SAFETY TIPS FOR PREVENTING FALLS Talk about your risk of falling.  Ask your caregiver why you are at risk for falling. Is it your medicine, illness, tubing placement, or something else?  Make a plan with your caregiver to keep you safe from falls.  Ask your caregiver or pharmacist about side effect of your medicines. Some medicines can make you dizzy or affect your coordination. Ask for help.  Ask for help before getting out of bed. You may need to press your call button.  Ask for assistance in getting you safely to the toilet.  Ask for a walker or cane to be put at your bedside. Ask that most of the side rails on your bed be placed up before your caregiver leaves the room.  Ask family or friends to sit with you.  Ask for things that are out of your reach, such as your glasses, hearing aids, telephone, bedside table, or call button. Follow these tips to avoid falling:  Stay lying or seated, rather than standing, while waiting for help.  Wear rubber-soled slippers or shoes whenever you walk in the hospital.  Avoid quick, sudden movements.  Change positions slowly.  Sit on the side of your bed before standing.  Stand up slowly and wait before you start to walk.  Let your caregiver know if there is a spill on the floor.  Pay careful attention to the medical equipment, electrical cords, and tubes around you.  When you need help, use your call button by your bed or in the  bathroom. Wait for one of your caregivers to help you.  If you feel dizzy or unsure of your footing, return to bed and wait for assistance.  Avoid being distracted by the TV, telephone, or another person in your room.  Do not lean or support yourself on rolling objects, such as IV poles or bedside tables. Document Released: 07/17/2000 Document Revised: 07/06/2012 Document Reviewed: 03/27/2012 [facility] Patient Information 2015 [place], [place]. This information is not intended to replace advice given to you by your health care provider. Make sure you discuss any questions you have with your health care provider.   Contusion A contusion is a deep bruise. Contusions are the result of an injury that caused bleeding under the skin. The contusion may turn blue, purple, or yellow. Minor injuries will give you a painless contusion, but more severe contusions may stay painful and swollen for a few weeks.  CAUSES  A contusion is usually caused by a blow, trauma, or direct force to an area of the body. SYMPTOMS   Swelling and redness of the injured area.  Bruising of the injured area.  Tenderness and soreness of the injured area.  Pain. DIAGNOSIS  The diagnosis can be made by taking a history and physical exam. An X-ray, CT scan, or MRI may be needed to determine if there were any associated injuries, such as fractures. TREATMENT  Specific treatment will depend on what area of the body was injured. In general, the best  treatment for a contusion is resting, icing, elevating, and applying cold compresses to the injured area. Over-the-counter medicines may also be recommended for pain control. Ask your caregiver what the best treatment is for your contusion. HOME CARE INSTRUCTIONS   Put ice on the injured area.  Put ice in a plastic bag.  Place a towel between your skin and the bag.  Leave the ice on for 15-20 minutes, 3-4 times a day, or as directed by your health care provider.  Only take  over-the-counter or prescription medicines for pain, discomfort, or fever as directed by your caregiver. Your caregiver may recommend avoiding anti-inflammatory medicines (aspirin, ibuprofen, and naproxen) for 48 hours because these medicines may increase bruising.  Rest the injured area.  If possible, elevate the injured area to reduce swelling. SEEK IMMEDIATE MEDICAL CARE IF:   You have increased bruising or swelling.  You have pain that is getting worse.  Your swelling or pain is not relieved with medicines. MAKE SURE YOU:   Understand these instructions.  Will watch your condition.  Will get help right away if you are not doing well or get worse. Document Released: 04/29/2005 Document Revised: 07/25/2013 Document Reviewed: 05/25/2011 [facility] Hospital[facility] Patient Information 2015 [place]Care, [place]LLC. This information is not intended to replace advice given to you by your health care provider. Make sure you discuss any questions you have with your health care provider.   Emergency Department Resource Guide 1) Find a Doctor and Pay Out of Pocket Although you won't have to find out who is covered by your insurance plan, it is a good idea to ask around and get recommendations. You will then need to call the office and see if the doctor you have chosen will accept you as a new patient and what types of options they offer for patients who are self-pay. Some doctors offer discounts or will set up payment plans for their patients who do not have insurance, but you will need to ask so you aren't surprised when you get to your appointment.  2) Contact Your Local Health Department Not all health departments have doctors that can see patients for sick visits, but many do, so it is worth a call to see if yours does. If you don't know where your local health department is, you can check in your phone book. The CDC also has a tool to help you locate your state's health department, and many state websites also have  listings of all of their local health departments.  3) Find a Walk-in Clinic If your illness is not likely to be very severe or complicated, you may want to try a walk in clinic. These are popping up all over the country in pharmacies, drugstores, and shopping centers. They're usually staffed by nurse practitioners or physician assistants that have been trained to treat common illnesses and complaints. They're usually fairly quick and inexpensive. However, if you have serious medical issues or chronic medical problems, these are probably not your best option.  No Primary Care Doctor: - Call Health Connect at  [phone] - they can help you locate a primary care doctor that  accepts your insurance, provides certain services, etc. - Physician Referral Service- [phone]  Chronic Pain Problems: Organization         Address  Phone   Notes  [facility]  [phone] Patients need to be referred by their primary care doctor.   Medication Assistance: Organization  Address  Phone   Notes  [facility] Medication [facility] [address] [place], [place] [address] [phone] --Must be a resident of [facility] -- Must have NO insurance coverage whatsoever (no Medicaid/ Medicare, etc.) -- The pt. MUST have a primary care doctor that directs their care regularly and follows them in the community   [facility]ist  [phone]   [employer]  [phone]    Agencies that provide inexpensive medical care: Organization         Address  Phone   Notes  [place]  [phone]   [facility]    [phone]   [facility] [place], [place] [address] [phone]   [place] [address] [place] [phone]   Planned Parenthood    [phone]   [facility]    [phone]   [place] [address] [place]  [address], [place] Phone:  [phone], Fax:  [phone] Hours of Operation:  9 am - 6 pm, M-F.  Also accepts Medicaid/Medicare and self-pay.  [facility] [place], [address], [place] Phone: [phone], Fax: [phone]. Hours of Operation:  8:30 am - 5:30 pm, M-F.  Also accepts Medicaid and self-pay.  [facility] [place] [address], [place] Phone: [phone]   [address], [place], [place] [phone], Ext. [phone] Mondays & Thursdays: 7-9 AM.  First 15 patients are seen on a first come, first serve basis.    [place] Providers:  Organization         Address  Phone   Notes  [facility] [address], [place] [phone] Also accepts self-pay patients.  [facility] [address], [place]  [phone]   [place], [address], [place] [phone]   [facility] [address] [place] [phone]   Lucianne Lei [address], [place]   [phone] Only accepts [place] Access [place] patients after they have their name applied to their card.   Self-Pay (no insurance) in [facility]:  Organization         Address  Phone   Notes  Sickle Cell Patients, [facility] [place] [phone]   [facility] [place] [phone]   [facility] [place]  [place], [place], [place] [phone]   [facility]/Dr. Osei-Bonsu  [address] [place] or [address], [place] [phone] Phone number for both [place] and [place] locations is the same.  Urgent Medical and [facility] [address], [place] [phone]   [facility] [address]  [place] or [address] [phone]   [facility] [address], [place] [phone], phone; [phone], fax Sees patients 1st and 3rd Saturday of every month.  Must not qualify for public or private insurance (i.e. Medicaid, Medicare, [place] Health Choice, Veterans' Benefits)  Household income should be no more than 200% of the poverty level The clinic cannot treat you if you are pregnant or think you  are pregnant  Sexually transmitted diseases are not treated at the clinic.    Dental Care: Organization         Address  Phone  Notes  [facility] Department of [facility] [place] [phone] Accepts children up to age 53 who are enrolled in [place] or [place]; pregnant women with a Medicaid card; and children who have applied for Medicaid or [place] Health Choice, but were declined, whose parents can pay a reduced fee at time of service.  [facility] Department of [facility]  [address], [place] [phone] Accepts children up to age 93 who are enrolled in [place] or [place]; pregnant women with a Medicaid card; and children who have applied for Medicaid or [place] Health Choice, but were declined, whose parents can pay a reduced fee at time of service.  [facility] Adult Dental Access PROGRAM  [place] [phone] Patients are seen by appointment only. Walk-ins are not accepted. [place] will see patients 31 years of age and older. Monday - Tuesday (8am-5pm) Most Wednesdays (8:30-5pm) $30 per visit, cash only  [facility] Adult Dental Access PROGRAM  [address], [facility] [phone] Patients are seen by appointment only. Walk-ins are not accepted. [place] will see patients 21 years of age and older. One Wednesday Evening (Monthly: Volunteer Based).  $30 per visit, cash only  [place]  [phone] for adults; Children under age 47, call Graduate Pediatric Dentistry at [phone]. Children aged 2-14, please call [phone] to request a pediatric application.  Dental services are provided in all areas of dental care including fillings, crowns and bridges, complete and partial dentures, implants, gum treatment, root canals, and extractions. Preventive care is also provided. Treatment is provided to both adults and children. Patients are selected via a lottery and there is often a waiting list.   [facility] [address], [place]  [phone] [email or web address]   [facility] [address] [place], [place] [phone], Ext. [phone] Second and Fourth Thursday of each month, opens at 6:30 AM; Clinic ends at 9 AM.  Patients are seen on a first-come first-served basis, and a limited number are seen during each clinic.   [facility]  [address] Hillard Danker [place], [place] [phone]   Eligibility Requirements You must have lived in [place], [place], or [place] counties for at least the last three months.   You cannot be eligible for state or federal sponsored [employer], including [employer], [place], or [employer].   You generally cannot be eligible for healthcare insurance through your employer.    How to apply: Eligibility screenings are held every Tuesday and Wednesday afternoon from 1:00 pm until 4:00 pm. You do not need an appointment for the interview!  [facility] [address], [place], [place]   [place]  [place] Department  [place]  [phone]    Behavioral Health Resources in the Community: Intensive Outpatient Programs Organization         Address  Phone  Notes  [place]. [address] [place], [place] [phone]     [facility] Outpatient [address] [place], [place]   ADS: Alcohol & Drug Svcs 119  [address], [place], [place]  [phone]   [facility] Mental [facility] [phone] [address]  [place], [place] [phone] or [phone]   Substance Abuse Resources Organization         Address  Phone  Notes  [facility] Drug Services  [phone]   [facility]  [phone]   [facility]  [phone]   Floydene Flock  [phone]   Residential & Outpatient Substance Abuse Program  [phone]   Psychological Services Organization         Address  Phone  Notes  [facility] Behavioral Health  [phone]   [facility]  [phone]   [facility] [address], [place] [phone] or [phone]    Mobile Crisis Teams Organization         Address  Phone  Notes  [facility], Mobile Crisis Care Unit  [phone]   [facility]  [address] [place], [place] [phone]   Doristine Locks [address] [place] [phone]    Self-Help/Support Groups Organization         Address  Phone             Notes  [facility] [place] - variety of support groups  336- I7437963 Call for more information  Narcotics Anonymous (NA), [facility] [address], [employer] [place]  2 meetings at this location   Statistician         Address  Phone  Notes  [facility] [address],    [address] [place]  [phone]   [facility]  [address] [place] [address], [place], [place] [phone]   [facility] [address] [place], [place] [phone] Admissions: 8am-3pm M-F  [facility] [address]    [place], [place] [phone]   [facility] [address] [place], [place], [place] [phone]   [facility] (Men) [address],  [place],  [place] [phone]   [facility] - Intensive Outpatient [address], [place], [place] [phone]   [facility] (Addiction Recovery [facility] [address] [place].,  [place], [place] [phone] or [phone]   [facility] ([facility]) [address], [place], [place] [phone] Accepts Medicaid  [place] [address]  [place] [phone] Substance Abuse/Addiction Treatment   [facility] Organization         Address  Phone  Notes  [facility]  [phone]   Angie Fava, PhD [address] Ervin Knack [place], [place]   [phone] or [phone]   [facility] Behavioral   [address] [place], [place] [phone]   [facility] [address], [place], [place] [phone] Insurance/Medicaid/sponsorship through [facility] [address]                                    [place], [place] [phone] Therapy/tele-psych/case  [facility] [address], [place] [phone]    Dr. Lolly Mustache  [phone]   [facility] [place]  [employer] [facility] Dept. 1) [address] [place] 2) [address], [place] 3)  [address], [place] [phone]  [phone]   [facility] Child Abuse Hotline (  336) L7645479 or (336) [phone] (After Hours)

## 2014-07-15 NOTE — Progress Notes (Signed)
D   Pt spent most of the shift in bed   She is depressed and sad   She was unable to come to the nurses station to get her medications because she said she feels lightheaded    Pt did not go to group and did not socialize with her peers this evening A   Verbal support given   Medications administered and effectiveness monitored   Instructed on fall precautions   Q 15 min checks R   Pt verbalized understanding and is presently safe
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
## 2014-07-15 NOTE — Progress Notes (Signed)
Psychoeducational Group Note  Date:  07/15/2014 Time: 1015 Group Topic/Focus:  Making Healthy Choices:   The focus of this group is to help patients identify negative/unhealthy choices they were using prior to admission and identify positive/healthier coping strategies to replace them upon discharge.  Participation Level:  Did Not Attend  Participation Quality:  N/A  Affect:  N/A  Cognitive:  N/A  Insight:  N/A  Engagement in Group:  N/A  Additional Comments:    Rich BraveDuke, Devan Babino Lynn 6:44 PM. 07/15/2014
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
## 2014-07-15 NOTE — Progress Notes (Signed)
Psychoeducational Group Note  Date:  07/15/2014 Time: 1015 Group Topic/Focus:  Making Healthy Choices:   The focus of this group is to help patients identify negative/unhealthy choices they were using prior to admission and identify positive/healthier coping strategies to replace them upon discharge.  Participation Level:  Did Not Attend  Participation Quality:  N/A  Affect:  N/A  Cognitive:  N/A  Insight:  N/A  Engagement in Group:  N/A  Additional Comments:   Rich BraveDuke, Azhane Eckart Lynn 5:54 PM. 07/15/2014
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
## 2014-07-16 NOTE — [facility] Suicide Risk Assessment (Signed)
[facility] INPATIENT:  Family/Significant Other Suicide Prevention Education  Suicide Prevention Education:  Patient Refusal for Family/Significant Other Suicide Prevention Education: The patient Mary Harper has refused to provide written consent for family/significant other to be provided Family/Significant Other Suicide Prevention Education during admission and/or prior to discharge.  Physician notified. SPE reviewed with patient and brochure provided.  Clyda Smyth, [place] CarboKristin L 07/16/2014, 4:59 PM

## 2014-07-16 NOTE — Plan of Care (Signed)
Problem: Diagnosis: Increased Risk For Suicide Attempt Goal: STG-Patient Will Attend All Groups On The Unit Outcome: Not Met (add Reason) Pt has been in bed in bed all evening this evening.

## 2014-07-16 NOTE — Progress Notes (Signed)
Adult Psychoeducational Group Note  Date:  07/16/2014 Time:  10:00AM  Group Topic/Focus:  Making Healthy Choices:   The focus of this group is to help patients identify negative/unhealthy choices they were using prior to admission and identify positive/healthier coping strategies to replace them upon discharge.  Participation Level:  Did Not Attend  Additional Comments:  Pt did not attend group. Pt was in her room, in the bed asleep during group time  Vashaun Osmon K 07/16/2014, 11:03 AM

## 2014-07-16 NOTE — Progress Notes (Signed)
Patient ID: Mary Harper, female   DOB: July 25, 1966, 48 y.o.   MRN: [id number] PER STATE REGULATIONS 482.30  THIS CHART WAS REVIEWED FOR MEDICAL NECESSITY WITH RESPECT TO THE PATIENT'S ADMISSION/DURATION OF STAY.  NEXT REVIEW DATE: 07/17/14.  Loura HaltBARBARA Fiore Detjen, RN, BSN CASE MANAGER

## 2014-07-16 NOTE — [facility] Group Notes (Signed)
[facility]  07/16/2014  8:45 AM  Participation Quality: Did Not Attend. Patient in bed, invited to attend.  Samuella BruinKristin Janayla Marik, MSW, [employer] IncLCSWA Clinical Social Worker [facility] [phone]

## 2014-07-16 NOTE — Progress Notes (Signed)
Patient ID: Mary Harper, female   DOB: [birth year], 48 y.o.   MRN: [id number] D: Patient in bed on approach. Pt reports she feels a lot better. Pt reports she was able to go to groups and join peers in the dinning room. Pt c/o headache and tremors.  Pt mood and affect appeared depressed and flat. Pt denies SI/HI/AVH.  A: Medications administered as prescribed. Pt encouraged to ask for help when getting out from bed. Emotional support given and will continue to monitor pt's progress for stabilization.  R: Patient remains safe and complaint with medications.

## 2014-07-16 NOTE — Progress Notes (Signed)
Psychoeducational Group Note  Date:  07/15/2014 Time:  2355  Group Topic/Focus:  Wrap-Up Group:   The focus of this group is to help patients review their daily goal of treatment and discuss progress on daily workbooks.  Participation Level: Did Not Attend  Participation Quality:  Not Applicable  Affect:  Not Applicable  Cognitive:  Not Applicable  Insight:  Not Applicable  Engagement in Group: Not Applicable  Additional Comments: The patient did not attend group this evening since she was resting in her bed.   Karel Mowers S 07/16/2014, 1:44 AM
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
## 2014-07-16 NOTE — Plan of Care (Deleted)
Problem: Diagnosis: Increased Risk For Suicide Attempt Goal: STG-Patient Will Attend All Groups On The Unit Outcome: Not Met (add Reason) Pt has been in bed in bed all evening.

## 2014-07-16 NOTE — Progress Notes (Signed)
D: Patient denies SI/HI and A/V hallucinations; patient had complaints of an headache and tremors and anxiety; rates depression 5/10 and anxiety 5/10  A: Monitored q 15 minutes; patient encouraged to attend groups; patient educated about medications; patient given medications per physician orders; patient encouraged to express feelings and/or concerns  R: Patient has been in the bed sleeping; patient is flat and does not brighten upon approach; patient's interaction with staff and peers is appropriate; patient was able to set goal to talk with staff 1:1 when having feelings of SI; patient is taking medications as prescribed and tolerating medications; patient is attending all groups

## 2014-07-16 NOTE — Progress Notes (Signed)
Patient ID: Mary Harper, female   DOB: June 20, 1966, 48 y.o.   MRN: [id number] D: Patient appears sad and depressed. Pt appeared drowsy and has been bed all evening. Pt denies any withdrawal symptoms. Pt denies SI/HI/AVH.  A: Medications administered as prescribed. Pt encouraged to ask for help when getting out from bed. Emotional support given and will continue to monitor pt's progress for stabilization.  R: Patient remains safe and complaint with medications. Pt denies pain.

## 2014-07-17 NOTE — Progress Notes (Signed)
Recreation Therapy Notes  Animal-Assisted Activity/Therapy (AAA/T) Program Checklist/Progress Notes Patient Eligibility Criteria Checklist & Daily Group note for Rec Tx Intervention  Date: 12.15.2015 Time: 2:45pm Location: [address] Dayroom    AAA/T Program Assumption of Risk Form signed by Patient/ or Parent Legal Guardian yes  Patient is free of allergies or sever asthma yes  Patient reports no fear of animals yes  Patient reports no history of cruelty to animals yes  Patient understands his/her participation is voluntary yes  Patient washes hands before animal contact yes  Patient washes hands after animal contact yes  Behavioral Response: Appropriate   Education: Hand Washing, Appropriate Animal Interaction   Education Outcome: Acknowledges education.   Clinical Observations/Feedback: Patient actively engaged in session, petting therapy dog appropriately from floor level and interacting with peers appropriately. Patient additionally shared stories about pets she has had in the past and that she will be taking care of a friend's dog during the day in the future.   Marykay Lexenise L Jannine Abreu, LRT/CTRS  Jearl KlinefelterBlanchfield, Sheddrick Lattanzio L 07/17/2014 4:27 PM

## 2014-07-17 NOTE — [facility] Counselor (Signed)
PSA completed on paper by weekend CSW and placed in shadow chart. PSA needs to be scanned into the chart.

## 2014-07-17 NOTE — Plan of Care (Signed)
Problem: Alteration in mood & ability to function due to Goal: STG-Patient will comply with prescribed medication regimen (Patient will comply with prescribed medication regimen)  Outcome: Progressing Pt is compliant with prescribed medication regimen     

## 2014-07-17 NOTE — Tx Team (Signed)
Interdisciplinary Treatment Plan Update   Date Reviewed:  07/17/2014  Time Reviewed:  9:14 AM  Progress in Treatment:   Attending groups: Yes Participating in groups: Yes Taking medication as prescribed: Yes  Tolerating medication: Yes Family/Significant other contact made:  No, but will ask patient for consent for collateral contact Patient understands diagnosis: Yes, patient understands diagnosis and need for treatment. Discussing patient identified problems/goals with staff: Yes, patient is able to express goals for treatment and discharge. Medical problems stabilized or resolved: Yes Denies suicidal/homicidal ideation: Yes Patient has not harmed self or others: Yes  For review of initial/current patient goals, please see plan of care.  Estimated Length of Stay:  1-2 days  Reasons for Continued Hospitalization:  Anxiety Depression Medication stabilization   New Problems/Goals identified:    Discharge Plan or Barriers:   [facility]  Additional Comments:  Continue medication stabilization.  Patient and CSW reviewed patient's identified goals and treatment plan.  Patient verbalized understanding and agreed to treatment plan.   Attendees:  Patient:  07/17/2014 9:14 AM   Signature:  Sallyanne HaversF. Cobos, MD 07/17/2014 9:14 AM  Signature: Geoffery LyonsIrving Lugo, MD 07/17/2014 9:14 AM  Signature:  Harold Barbanonecia Byrd, RN 07/17/2014 9:14 AM  Signature:Beverly Terrilee CroakKnight, RN 07/17/2014 9:14 AM  Signature:  Serena ColonelAggie Nwoko, NP 07/17/2014 9:14 AM  Signature:  Juline PatchQuylle Ameera Tigue, LCSW 07/17/2014 9:14 AM  Signature:  Belenda CruiseKristin Drinkard, LCSW-A 07/17/2014 9:14 AM  Signature:  Leisa LenzValerie Enoch, Care Coordinator [facility] 07/17/2014 9:14 AM  Signature:  Earl ManySara Twyman,  RN 07/17/2014 9:14 AM  Signature: Liborio NixonPatrice White, RN 07/17/2014  9:14 AM  Signature:   Onnie BoerJennifer Clark, RN [facility] 07/17/2014  9:14 AM  Signature:  Santa GeneraAnne Cunningham Lead Social Worker LCSW 07/17/2014  9:14 AM    Scribe for Treatment  Team:   [place] EnergyQuylle Demetrio Leighty,  07/17/2014 9:14 AM

## 2014-07-17 NOTE — Progress Notes (Signed)
[facility] Group Notes:  (Nursing/MHT/Case Management/Adjunct)  Date:  07/17/2014  Time:  2:27 AM  Type of Therapy:  Psychoeducational Skills  Participation Level:  Did not attend  Natasha Burda C 07/17/2014, 2:27 AM 
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
## 2014-07-17 NOTE — Progress Notes (Signed)
Patient ID: Mary Harper, female   DOB: 1965/11/16, 48 y.o.   MRN: [id number]  D: Pt currently presents with a blunted affect and cooperative behavior. Per self inventory, pt rates depression at a 3, hopelessness 1 and anxiety 5. Pt's daily goal is to "feeling physically better to go home" and they intend to do so by eat, take meds, rest." Pt reports good sleep, poor concentration and a fair appetite. Pt also writes "I have questions about my dosage of Depokene and Lithium." Pt reports, in regards to her discharge, that "my ACT team is there in place and I have a WRAP plan I need to revise and use."   A: Pt provided with medications per providers orders. Pt's labs and vitals were monitored throughout the day. Pt supported emotionally and encouraged to express concerns and questions. Pt consulted with provider and Clinical research associatewriter.  Pt educated on symptoms of withdrawal and the unit rules.   R: Pt's safety ensured with 15 minute and environmental checks. Pt currently denies SI/HI and A/V hallucinations. Pt verbally agrees to seek staff if SI/HI or A/VH occurs and to consult with staff before acting on these thoughts. Pt was in bed for most of the morning and did not attend group. Pt encouraged to go to group and participate in unit activities.    Aurora Maskwyman, Tameron Lama E, RN
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
## 2014-07-17 NOTE — Progress Notes (Signed)
Chaplain provided spiritual and emotional support with Zanaria while rounding on unit.  1:1 encounter in 300 day room.

## 2014-07-17 NOTE — Progress Notes (Signed)
Patient ID: Mary Harper, female   DOB: 10-Nov-1965, 48 y.o.   MRN: [id number] PER STATE REGULATIONS 482.30  THIS CHART WAS REVIEWED FOR MEDICAL NECESSITY WITH RESPECT TO THE PATIENT'S ADMISSION/ DURATION OF STAY.  NEXT REVIEW DATE: PER STATE REGULATIONS 482.30  THIS CHART WAS REVIEWED FOR MEDICAL NECESSITY WITH RESPECT TO THE PATIENT'S ADMISSION/ DURATION OF STAY.  NEXT REVIEW DATE: 07/21/2014  Willa RoughJENNIFER JONES Mary Devin, RN, BSN CASE MANAGER
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
## 2014-07-18 DIAGNOSIS — F1020 Alcohol dependence, uncomplicated: Secondary | ICD-10-CM

## 2014-07-18 LAB — LITHIUM LEVEL: LITHIUM LVL: 0.86 meq/L (ref 0.80–1.40)

## 2014-07-18 LAB — VALPROIC ACID LEVEL: VALPROIC ACID LVL: 46.4 ug/mL — AB (ref 50.0–100.0)

## 2014-07-18 MED ORDER — PRAZOSIN HCL 1 MG PO CAPS: 1.0000 mg | ORAL_CAPSULE | Freq: Every day | ORAL | Status: DC

## 2014-07-18 MED ORDER — LITHIUM CITRATE 300 MG/5 ML PO SYRP: 900.0000 mg | Freq: Every day | ORAL | Status: DC

## 2014-07-18 MED ORDER — FLUOXETINE HCL 20 MG PO CAPS: 20.0000 mg | ORAL_CAPSULE | Freq: Every day | ORAL | Status: DC

## 2014-07-18 MED ORDER — GABAPENTIN 300 MG PO CAPS: 300.0000 mg | ORAL_CAPSULE | Freq: Three times a day (TID) | ORAL | Status: DC

## 2014-07-18 MED ORDER — TRAZODONE HCL 100 MG PO TABS: 100.0000 mg | ORAL_TABLET | Freq: Every day | ORAL | Status: DC

## 2014-07-18 MED ORDER — VALPROIC ACID 250 MG/5ML PO SYRP
750.0000 mg | ORAL_SOLUTION | Freq: Every day | ORAL | Status: DC
Start: 2014-07-18 — End: 2014-10-15
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
## 2014-07-18 NOTE — [facility] Group Notes (Signed)
Adult Psychoeducational Group Note  Date:  07/18/2014 Time:  5:47 AM  Group Topic/Focus:  Wrap-Up Group  Participation Level:  Active  Participation Quality:  Appropriate  Affect:  Appropriate  Cognitive:  Appropriate  Insight: Good  Engagement in Group:  Engaged  Modes of Intervention:  Discussion and Support  Additional Comments: Pt stated that she had a pretty good day and looking forward to being d/c'd tomorrow.  Feels as though she has a good follow-up plan and feels like she is ready to go.  Tomi BambergerChestnut, Mansi Tokar Coursey 07/18/2014, 5:47 AM

## 2014-07-18 NOTE — Progress Notes (Signed)
Patient ID: Mary Harper, female   DOB: 1966-05-06, 48 y.o.   MRN: [id number]  Pt currently presents with a blunted affect and pleasant behavior. Per self inventory, pt rates depression at a 2, hopelessness 0 and anxiety 2. Pt's daily goal is to "going home to a safe environment" and they intend to do so by "work with ACT team." Pt reports fair sleep, good concentration and a good appetite.   Pt provided with medications per providers orders. Pt's labs and vitals were monitored throughout the day. Pt supported emotionally and encouraged to express concerns and questions. Pt consulted with Child psychotherapistsocial worker, provider and Clinical research associatewriter. Pt educated on stress managment.  Pt's safety ensured with 15 minute and environmental checks. Pt currently denies SI/HI and A/V hallucinations. Pt verbally agrees to seek staff if SI/HI or A/VH occurs and to consult with staff before acting on these thoughts. Pt to be discharged today.   Aurora Maskwyman, Audrena Talaga E, RN

## 2014-07-18 NOTE — Progress Notes (Signed)
[facility] Discharge [facility] :  Will you be returning to the same living situation after discharge: Yes,  Patient is returning to her home. At discharge, do you have transportation home?:Yes,  Patient's ACT Team to transport her home. Do you have the ability to pay for your medications:Yes,  Patient has Medicare.  Release of information consent forms completed and in the chart;  Patient's signature needed at discharge.  Patient to Follow up at: Follow-up Information    Follow up with [facility]    .   Contact information:   [address] [place] [id number] [phone]      Follow up with [name] On 07/18/2014.   Why:  Someone from your ACT Team will see you on Thursday, July 19, 2014.  ACT Team to transport patient home.   Contact information:   [address] [place] [address] TEL: [phone] FAX: [phone]      Patient denies SI/HI:  Patient no longer endorsing SI/HI or other thoughts of self harm.   Safety Planning and Suicide Prevention discussed: .Reviewed with all patients during discharge planning group    Rilyn Scroggs Hairston 07/18/2014, 11:13 AM
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
## 2014-07-18 NOTE — [facility] Group Notes (Signed)
[facility]   07/18/2014 11:12 AM    Participation Quality:  Patient did not attend group.  Patient was asked to attend group but remained in bed.    Wynn BankerHodnett, Vannary Greening Hairston  07/18/2014   11:13 AM
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
## 2014-07-18 NOTE — Progress Notes (Signed)
Patient ID: Mary Harper, female   DOB: 12/15/1965, 48 y.o.   MRN: [id number]  Pt discharged home with [place] of her ACT team. Pt was stable and smiling. All papers and prescriptions were given and valuables returned. Verbal understanding expressed. Denies SI/HI and A/VH. Coping skills expressed.

## 2014-07-20 NOTE — Progress Notes (Signed)
Patient Discharge Instructions:  After Visit Summary (AVS):   Faxed to:  07/20/14 Discharge Summary Note:   Faxed to:  07/20/14 Psychiatric Admission Assessment Note:   Faxed to:  07/20/14 Suicide Risk Assessment - Discharge Assessment:   Faxed to:  07/20/14 Faxed/Sent to the Next Level Care provider:  07/20/14 Next Level Care Provider Has Access to the EMR, 07/20/14  Faxed to [name] @ [phone] [facility] via CHL/Epic access. Jerelene ReddenSheena E [place], 07/20/2014, 4:00 PM

## 2014-09-10 ENCOUNTER — Emergency Department (HOSPITAL_COMMUNITY)
Admission: EM | Admit: 2014-09-10 | Discharge: 2014-09-11 | Payer: Medicare Other | Attending: Emergency Medicine | Admitting: Emergency Medicine

## 2014-09-10 ENCOUNTER — Encounter (HOSPITAL_COMMUNITY): Payer: Self-pay | Admitting: Emergency Medicine

## 2014-09-10 DIAGNOSIS — R45851 Suicidal ideations: Secondary | ICD-10-CM | POA: Diagnosis not present

## 2014-09-10 DIAGNOSIS — F419 Anxiety disorder, unspecified: Secondary | ICD-10-CM

## 2014-09-10 DIAGNOSIS — R000 Tachycardia, unspecified: Secondary | ICD-10-CM | POA: Diagnosis not present

## 2014-09-10 DIAGNOSIS — Z79899 Other long term (current) drug therapy: Secondary | ICD-10-CM | POA: Diagnosis not present

## 2014-09-10 LAB — CBC
HEMATOCRIT: 44.2 % (ref 36.0–46.0)
HEMOGLOBIN: 14.7 g/dL (ref 12.0–15.0)
MCH: 32.7 pg (ref 26.0–34.0)
MCHC: 33.3 g/dL (ref 30.0–36.0)
MCV: 98.4 fL (ref 78.0–100.0)
PLATELETS: 287 10*3/uL (ref 150–400)
RBC: 4.49 MIL/uL (ref 3.87–5.11)
RDW: 13.6 % (ref 11.5–15.5)
WBC: 9.4 10*3/uL (ref 4.0–10.5)

## 2014-09-10 LAB — RAPID URINE DRUG SCREEN, HOSP PERFORMED
Amphetamines: NOT DETECTED
BARBITURATES: NOT DETECTED
Benzodiazepines: NOT DETECTED
COCAINE: NOT DETECTED
Opiates: NOT DETECTED
TETRAHYDROCANNABINOL: NOT DETECTED

## 2014-09-10 LAB — COMPREHENSIVE METABOLIC PANEL
ALBUMIN: 4.2 g/dL (ref 3.5–5.2)
ALK PHOS: 77 U/L (ref 39–117)
ALT: 18 U/L (ref 0–35)
AST: 29 U/L (ref 0–37)
Anion gap: 13 (ref 5–15)
BUN: 12 mg/dL (ref 6–23)
CALCIUM: 9.1 mg/dL (ref 8.4–10.5)
CO2: 22 mmol/L (ref 19–32)
Chloride: 106 mmol/L (ref 96–112)
Creatinine, Ser: 0.49 mg/dL — ABNORMAL LOW (ref 0.50–1.10)
GFR calc Af Amer: >90 mL/min (ref >90)
Glucose, Bld: 98 mg/dL (ref 70–99)
Potassium: 3.9 mmol/L (ref 3.5–5.1)
Sodium: 141 mmol/L (ref 135–145)
TOTAL PROTEIN: 7.4 g/dL (ref 6.0–8.3)
Total Bilirubin: 0.6 mg/dL (ref 0.3–1.2)

## 2014-09-10 LAB — ACETAMINOPHEN LEVEL: Acetaminophen (Tylenol), Serum: <10 ug/mL — ABNORMAL LOW (ref 10–30)

## 2014-09-10 LAB — ETHANOL: Alcohol, Ethyl (B): 248 mg/dL — ABNORMAL HIGH (ref 0–9)

## 2014-09-10 LAB — SALICYLATE LEVEL: Salicylate Lvl: <4 mg/dL (ref 2.8–20.0)

## 2014-09-10 MED ORDER — FLUOXETINE HCL 20 MG PO CAPS
20.0000 mg | ORAL_CAPSULE | Freq: Every day | ORAL | Status: DC
  Administered 2014-09-11: 20 mg via ORAL
  Filled 2014-09-10 (×2): qty 1

## 2014-09-10 MED ORDER — ALUM & MAG HYDROXIDE-SIMETH 200-200-20 MG/5ML PO SUSP: 30.0000 mL | ORAL | Status: DC | PRN

## 2014-09-10 MED ORDER — TRAZODONE HCL 100 MG PO TABS
100.0000 mg | ORAL_TABLET | Freq: Every day | ORAL | Status: DC
  Administered 2014-09-10: 100 mg via ORAL
  Filled 2014-09-10: qty 1

## 2014-09-10 MED ORDER — VALPROIC ACID 250 MG/5ML PO SYRP
750.0000 mg | ORAL_SOLUTION | Freq: Every day | ORAL | Status: DC
  Administered 2014-09-10: 750 mg via ORAL
  Filled 2014-09-10 (×2): qty 15

## 2014-09-10 MED ORDER — HYDROXYZINE HCL 10 MG PO TABS
10.0000 mg | ORAL_TABLET | Freq: Once | ORAL | Status: AC
Start: 2014-09-10 — End: 2014-09-10
  Administered 2014-09-10: 10 mg via ORAL
  Filled 2014-09-10: qty 1

## 2014-09-10 MED ORDER — LORAZEPAM 1 MG PO TABS
1.0000 mg | ORAL_TABLET | Freq: Three times a day (TID) | ORAL | Status: DC | PRN
  Administered 2014-09-10: 1 mg via ORAL
  Filled 2014-09-10: qty 1

## 2014-09-10 MED ORDER — GABAPENTIN 300 MG PO CAPS
300.0000 mg | ORAL_CAPSULE | Freq: Three times a day (TID) | ORAL | Status: DC
  Administered 2014-09-10 – 2014-09-11 (×2): 300 mg via ORAL
  Filled 2014-09-10 (×2): qty 1

## 2014-09-10 MED ORDER — LITHIUM CITRATE 300 MG/5 ML PO SYRP
900.0000 mg | Freq: Every day | ORAL | Status: DC
  Administered 2014-09-10: 900 mg via ORAL
  Filled 2014-09-10 (×2): qty 15

## 2014-09-10 MED ORDER — PRAZOSIN HCL 1 MG PO CAPS
1.0000 mg | ORAL_CAPSULE | Freq: Every day | ORAL | Status: DC
  Administered 2014-09-10: 1 mg via ORAL
  Filled 2014-09-10 (×2): qty 1

## 2014-09-10 MED ORDER — ONDANSETRON HCL 4 MG PO TABS: 4.0000 mg | ORAL_TABLET | Freq: Three times a day (TID) | ORAL | Status: DC | PRN

## 2014-09-10 MED ORDER — ACETAMINOPHEN 325 MG PO TABS: 650.0000 mg | ORAL_TABLET | ORAL | Status: DC | PRN

## 2014-09-10 NOTE — Progress Notes (Signed)
CSW faxed pt. Referral to [facility] and [facility].  Will continue to seek placement.  Catia Manuelita Moxon, LCSWA Disposition staff 09/10/2014 6:58 PM  
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
## 2014-09-10 NOTE — Progress Notes (Signed)
Patient in bed resting with eyes closed for most part of the evening. Writer checked on patient during hourly round. Patient awake at about the time for bedtime medications. She complaint of feeling anxious, endorsed suicide thoughts but contracted for safety. Denied Hallucinations. Writer offered patent all her bedtime medications. She received meds without difficulty. Q 15 minute checks continues as ordered to maintain safety.
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
## 2014-09-11 MED ORDER — WHITE PETROLATUM GEL
Status: DC | PRN
  Filled 2014-09-11: qty 5
  Filled 2014-09-11: qty 28.35

## 2014-09-11 NOTE — BH Assessment (Addendum)
[facility] to [facility] more information.  1. When did pt have gastirc bypass 2. Does she eat solid foods 3. Concerns about acute withdrawal sx  Nurse reports pt has been sleeping since she came on shift and she is not aware of the above. Will discuss with pt around 6 AM after vitals.   [id number] attempted to wake pt to collect information. Pt would not arose.   Clista BernhardtNancy Brayson Livesey, [facility] Triage Specialist 09/11/2014 5:41 AM

## 2014-09-11 NOTE — ED Notes (Signed)
Patient resting quietly with eyes closed. Respirations even and unlabored. No distress noted . Q 15 minute check continues as ordered to maintain safety. 

## 2014-09-11 NOTE — [facility] Counselor (Signed)
[facility]  Under care of Dr. Mayford KnifeWilliams Room # [phone] # to call for report is [phone]  Lanice ShirtsAdvised Marcy, RN in [facility].  Beryle FlockMary Shasha Buchbinder, MS, CRC, [facility] Triage Specialist [facility]

## 2014-10-03 ENCOUNTER — Emergency Department (HOSPITAL_COMMUNITY)
Admission: EM | Admit: 2014-10-03 | Discharge: 2014-10-03 | Disposition: A | Discharge status: Other Acute Inpatient Hospital | Payer: Medicare Other | Source: Home / Self Care | Attending: Emergency Medicine | Admitting: Emergency Medicine

## 2014-10-03 ENCOUNTER — Inpatient Hospital Stay (HOSPITAL_COMMUNITY)
Admission: AD | Admit: 2014-10-03 | Discharge: 2014-10-15 | DRG: 885 | Disposition: A | Discharge status: Home / Self Care | Payer: Medicare Other | Source: Intra-hospital | Attending: Psychiatry | Admitting: Psychiatry

## 2014-10-03 ENCOUNTER — Encounter (HOSPITAL_COMMUNITY): Payer: Self-pay

## 2014-10-03 ENCOUNTER — Encounter (HOSPITAL_COMMUNITY): Payer: Self-pay | Admitting: Emergency Medicine

## 2014-10-03 DIAGNOSIS — Z9884 Bariatric surgery status: Secondary | ICD-10-CM

## 2014-10-03 DIAGNOSIS — F1310 Sedative, hypnotic or anxiolytic abuse, uncomplicated: Secondary | ICD-10-CM

## 2014-10-03 DIAGNOSIS — R51 Headache: Secondary | ICD-10-CM | POA: Diagnosis present

## 2014-10-03 DIAGNOSIS — R413 Other amnesia: Secondary | ICD-10-CM

## 2014-10-03 DIAGNOSIS — Z79899 Other long term (current) drug therapy: Secondary | ICD-10-CM

## 2014-10-03 DIAGNOSIS — F314 Bipolar disorder, current episode depressed, severe, without psychotic features: Principal | ICD-10-CM | POA: Diagnosis present

## 2014-10-03 DIAGNOSIS — R45851 Suicidal ideations: Secondary | ICD-10-CM

## 2014-10-03 DIAGNOSIS — F319 Bipolar disorder, unspecified: Secondary | ICD-10-CM | POA: Diagnosis present

## 2014-10-03 DIAGNOSIS — G4700 Insomnia, unspecified: Secondary | ICD-10-CM | POA: Diagnosis present

## 2014-10-03 HISTORY — DX: Carpal tunnel syndrome, bilateral upper limbs: G56.03

## 2014-10-03 HISTORY — DX: Bariatric surgery status: Z98.84

## 2014-10-03 HISTORY — DX: Post-traumatic stress disorder, unspecified: F43.10

## 2014-10-03 HISTORY — DX: Anxiety disorder, unspecified: F41.9

## 2014-10-03 LAB — RAPID URINE DRUG SCREEN, HOSP PERFORMED
Amphetamines: NOT DETECTED
Barbiturates: NOT DETECTED
Benzodiazepines: POSITIVE — AB
Cocaine: NOT DETECTED
Opiates: NOT DETECTED
Tetrahydrocannabinol: NOT DETECTED

## 2014-10-03 LAB — COMPREHENSIVE METABOLIC PANEL
ALT: 15 U/L (ref 0–35)
AST: 28 U/L (ref 0–37)
Albumin: 3.4 g/dL — ABNORMAL LOW (ref 3.5–5.2)
Alkaline Phosphatase: 55 U/L (ref 39–117)
Anion gap: 7 (ref 5–15)
BILIRUBIN TOTAL: 0.4 mg/dL (ref 0.3–1.2)
BUN: 9 mg/dL (ref 6–23)
CHLORIDE: 104 mmol/L (ref 96–112)
CO2: 25 mmol/L (ref 19–32)
Calcium: 8.8 mg/dL (ref 8.4–10.5)
Creatinine, Ser: 0.62 mg/dL (ref 0.50–1.10)
GFR calc Af Amer: >90 mL/min (ref >90)
Glucose, Bld: 88 mg/dL (ref 70–99)
Potassium: 3.9 mmol/L (ref 3.5–5.1)
Sodium: 136 mmol/L (ref 135–145)
Total Protein: 6.4 g/dL (ref 6.0–8.3)

## 2014-10-03 LAB — CBC
HCT: 38.6 % (ref 36.0–46.0)
HEMOGLOBIN: 12.5 g/dL (ref 12.0–15.0)
MCH: 31.9 pg (ref 26.0–34.0)
MCHC: 32.4 g/dL (ref 30.0–36.0)
MCV: 98.5 fL (ref 78.0–100.0)
Platelets: 222 10*3/uL (ref 150–400)
RBC: 3.92 MIL/uL (ref 3.87–5.11)
RDW: 13.3 % (ref 11.5–15.5)
WBC: 7.3 10*3/uL (ref 4.0–10.5)

## 2014-10-03 LAB — ETHANOL: Alcohol, Ethyl (B): <5 mg/dL (ref 0–9)

## 2014-10-03 LAB — SALICYLATE LEVEL

## 2014-10-03 LAB — LITHIUM LEVEL: Lithium Lvl: 1.18 mmol/L (ref 0.80–1.40)

## 2014-10-03 LAB — ACETAMINOPHEN LEVEL: Acetaminophen (Tylenol), Serum: <10 ug/mL — ABNORMAL LOW (ref 10–30)

## 2014-10-03 LAB — VALPROIC ACID LEVEL: Valproic Acid Lvl: 50.2 ug/mL (ref 50.0–100.0)

## 2014-10-03 MED ORDER — PRAZOSIN HCL 1 MG PO CAPS
1.0000 mg | ORAL_CAPSULE | Freq: Every day | ORAL | Status: DC
  Administered 2014-10-05 – 2014-10-14 (×8): 1 mg via ORAL
  Filled 2014-10-03 (×15): qty 1

## 2014-10-03 MED ORDER — ACETAMINOPHEN 325 MG PO TABS
650.0000 mg | ORAL_TABLET | Freq: Four times a day (QID) | ORAL | Status: DC | PRN
  Administered 2014-10-03 – 2014-10-05 (×3): 650 mg via ORAL
  Filled 2014-10-03 (×3): qty 2

## 2014-10-03 MED ORDER — VALPROIC ACID 250 MG/5ML PO SYRP
750.0000 mg | ORAL_SOLUTION | Freq: Two times a day (BID) | ORAL | Status: DC
  Administered 2014-10-04 – 2014-10-15 (×23): 750 mg via ORAL
  Filled 2014-10-03 (×25): qty 15

## 2014-10-03 MED ORDER — ALUM & MAG HYDROXIDE-SIMETH 200-200-20 MG/5ML PO SUSP: 30.0000 mL | ORAL | Status: DC | PRN

## 2014-10-03 MED ORDER — HYDROXYZINE HCL 50 MG PO TABS
50.0000 mg | ORAL_TABLET | Freq: Every evening | ORAL | Status: DC | PRN
  Filled 2014-10-03: qty 7

## 2014-10-03 MED ORDER — TRAZODONE HCL 100 MG PO TABS
100.0000 mg | ORAL_TABLET | Freq: Every day | ORAL | Status: DC
  Administered 2014-10-03 – 2014-10-07 (×4): 100 mg via ORAL
  Filled 2014-10-03 (×9): qty 1

## 2014-10-03 MED ORDER — PRAZOSIN HCL 1 MG PO CAPS
1.0000 mg | ORAL_CAPSULE | Freq: Every day | ORAL | Status: DC
  Administered 2014-10-03: 1 mg via ORAL
  Filled 2014-10-03: qty 1

## 2014-10-03 MED ORDER — LITHIUM CITRATE 300 MG/5 ML PO SYRP
900.0000 mg | Freq: Every day | ORAL | Status: DC
  Administered 2014-10-03: 900 mg via ORAL
  Filled 2014-10-03: qty 15

## 2014-10-03 MED ORDER — FLUOXETINE HCL 20 MG PO CAPS
20.0000 mg | ORAL_CAPSULE | Freq: Every day | ORAL | Status: DC
  Administered 2014-10-04 – 2014-10-15 (×12): 20 mg via ORAL
  Filled 2014-10-03 (×13): qty 1

## 2014-10-03 MED ORDER — GABAPENTIN 300 MG PO CAPS
300.0000 mg | ORAL_CAPSULE | Freq: Three times a day (TID) | ORAL | Status: DC
  Administered 2014-10-03 (×2): 300 mg via ORAL
  Filled 2014-10-03 (×2): qty 1

## 2014-10-03 MED ORDER — MAGNESIUM HYDROXIDE 400 MG/5ML PO SUSP
30.0000 mL | Freq: Every day | ORAL | Status: DC | PRN
Start: 2014-10-03 — End: 2014-10-15

## 2014-10-03 MED ORDER — GABAPENTIN 300 MG PO CAPS
300.0000 mg | ORAL_CAPSULE | Freq: Three times a day (TID) | ORAL | Status: DC
  Administered 2014-10-04 – 2014-10-15 (×34): 300 mg via ORAL
  Filled 2014-10-03 (×37): qty 1

## 2014-10-03 MED ORDER — ONDANSETRON HCL 4 MG PO TABS: 4.0000 mg | ORAL_TABLET | Freq: Three times a day (TID) | ORAL | Status: DC | PRN

## 2014-10-03 MED ORDER — VALPROIC ACID 250 MG/5ML PO SYRP
750.0000 mg | ORAL_SOLUTION | Freq: Every day | ORAL | Status: DC
  Administered 2014-10-03: 750 mg via ORAL
  Filled 2014-10-03: qty 15

## 2014-10-03 MED ORDER — TRAZODONE HCL 100 MG PO TABS
100.0000 mg | ORAL_TABLET | Freq: Every day | ORAL | Status: DC
  Filled 2014-10-03: qty 1

## 2014-10-03 MED ORDER — LITHIUM CITRATE 300 MG/5 ML PO SYRP
900.0000 mg | Freq: Every day | ORAL | Status: DC
  Filled 2014-10-03 (×2): qty 15

## 2014-10-03 MED ORDER — FLUOXETINE HCL 20 MG PO CAPS
20.0000 mg | ORAL_CAPSULE | Freq: Every day | ORAL | Status: DC
  Filled 2014-10-03: qty 1
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
## 2014-10-03 NOTE — ED Notes (Signed)
Patient and belongings wanded 

## 2014-10-03 NOTE — BH Assessment (Signed)
[facility] Assessment Progress Note  Per Nanine MeansJamison Lord, NP, [facility]  Thurman CoyerEric Kaplan, RN, [facility] has assigned pt to Rm 300-2.  As of this writing pt has not signed Voluntary Admission and Consent for Treatment or Consent to Release Information.  However, pt's nurse agrees to handle this.  Doylene Canninghomas Susan Arana, MA Triage Specialist 10/03/2014 @ 16:48

## 2014-10-03 NOTE — Progress Notes (Signed)
  CARE MANAGEMENT ED NOTE 10/03/2014  Patient:  Charmayne SheerLONSO,Maleny   Account Number:  [id number]  Date Initiated:  10/03/2014  Documentation initiated by:  Edd ArbourGIBBS,Reggie Bise  Subjective/Objective Assessment:   49 yr old medicare [place] pt having SI, anxiety and depression     Subjective/Objective Assessment Detail:   pt with no pcp pt and has not attempt to find a new dr with medicare list provided by ED CM on 09/10/14  Pt states she has changed address since [place]10/14 [place] is [address] agreed to a list of pcp medic[place] zip [address]  Pt pleasant     Action/Plan:   spoke with pt about pcp, importance of pcp f/u Provided with a new list of medicare providers Pt appreciative of resources   Action/Plan Detail:   Anticipated DC Date:  10/04/2014     Status Recommendation to Physician:   Result of Recommendation:    Other ED Services  Consult Working Plan    DC Planning Services  Other  Outpatient Services - Pt will follow up  PCP issues    Choice offered to / List presented to:            Status of service:  Completed, signed off  ED Comments:   ED Comments Detail:
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
## 2014-10-03 NOTE — ED Notes (Addendum)
Pt sts that she is having SI, anxiety and depression. Pt sts that she has had bad memory and it has been getting worse. Pt sts she has had "blackouts" without drinking. She has not passed out, just unable to recall events. No neuro deficits noted. Pt sts that her SI consists of hanging thoughts, overdose, cutting with a razor blade. She sts her mind has been racing. Pt is calm and cooperative in triage. Pt lives in a boarding house.  Pt does have an ACT team that works with her. Ronnie with [name] [phone].

## 2014-10-04 ENCOUNTER — Encounter (HOSPITAL_COMMUNITY): Payer: Self-pay | Admitting: Psychiatry

## 2014-10-04 DIAGNOSIS — R45851 Suicidal ideations: Secondary | ICD-10-CM

## 2014-10-04 DIAGNOSIS — F314 Bipolar disorder, current episode depressed, severe, without psychotic features: Principal | ICD-10-CM

## 2014-10-04 MED ORDER — LITHIUM CARBONATE 300 MG PO CAPS
900.0000 mg | ORAL_CAPSULE | Freq: Every day | ORAL | Status: AC
  Filled 2014-10-04: qty 3

## 2014-10-04 MED ORDER — LITHIUM CITRATE 300 MG/5 ML PO SYRP
900.0000 mg | Freq: Every day | ORAL | Status: DC
  Administered 2014-10-05 – 2014-10-14 (×9): 900 mg via ORAL
  Filled 2014-10-04 (×13): qty 15

## 2014-10-04 MED ORDER — ARIPIPRAZOLE 2 MG PO TABS
2.0000 mg | ORAL_TABLET | Freq: Every day | ORAL | Status: DC
  Administered 2014-10-05 – 2014-10-08 (×4): 2 mg via ORAL
  Filled 2014-10-04 (×6): qty 1

## 2014-10-04 NOTE — Progress Notes (Signed)
D: Patient denies HI and A/V hallucinations; patient reports that she did not sleep well  A: Monitored q 15 minutes; patient encouraged to attend groups; patient educated about medications; patient given medications per physician orders; patient encouraged to express feelings and/or concerns  R: Patient is flat and sad; patient is cooperative; patient is blunted; patient is taking medications as prescribed and tolerating medications; patient is attending some of the groups; patient forwards when asked
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
## 2014-10-04 NOTE — Tx Team (Signed)
Initial Interdisciplinary Treatment Plan   PATIENT STRESSORS: Financial difficulties Health problems Loss of dad Substance abuse   PATIENT STRENGTHS: Ability for insight Average or above average intelligence General fund of knowledge Motivation for treatment/growth Special hobby/interest   PROBLEM LIST: Problem List/Patient Goals Date to be addressed Date deferred Reason deferred Estimated date of resolution  Increased risk for SI "stop having Suicidal thoughts"  10/03/14   D/c  Substance Abuse: ETOH binge drinking 10/03/14     Blackouts "find out why I have a lapse of memory". 10/03/14     Depression  10/03/14     anxiety 10/03/14                              DISCHARGE CRITERIA:  Improved stabilization in mood, thinking, and/or behavior Motivation to continue treatment in a less acute level of care Need for constant or close observation no longer present Reduction of life-threatening or endangering symptoms to within safe limits Verbal commitment to aftercare and medication compliance  PRELIMINARY DISCHARGE PLAN: Attend PHP/IOP  PATIENT/FAMIILY INVOLVEMENT: This treatment plan has been presented to and reviewed with the patient, Mary Harper.  The patient and family have been given the opportunity to ask questions and make suggestions.  Harjot Dibello A 10/04/2014, 5:08 AM
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
## 2014-10-04 NOTE — Progress Notes (Signed)
D   Pt has been in bed resting with her eyes closed and no distress noted    She did not wake up when her name was called and did not receive her HS medications A   Verbal support offered   Medications offered    Q 15 min checks R   Pt safe at present

## 2014-10-04 NOTE — [facility] Group Notes (Signed)
The focus of this group is to educate the patient on the purpose and policies of crisis stabilization and provide a format to answer questions about their admission.  The group details unit policies and expectations of patients while admitted. Patient did not attend the 0900 nursing group. 

## 2014-10-04 NOTE — Progress Notes (Addendum)
Patient ID: Mary Harper, female   DOB: [birth year], 49 y.o.   MRN: [id number] PER STATE REGULATIONS 482.30  THIS CHART WAS REVIEWED FOR MEDICAL NECESSITY WITH RESPECT TO THE PATIENT'S ADMISSION/DURATION OF STAY.  NEXT REVIEW DATE: 10/07/14  Loura HaltBARBARA Sybol Morre, RN, BSN CASE MANAGER

## 2014-10-04 NOTE — [facility] Group Notes (Signed)
[facility] LCSW Group Therapy 10/04/2014 1:15 PM Type of Therapy: Group Therapy Participation Level: Active  Participation Quality: Attentive, Sharing and Supportive  Affect: Depressed and Flat  Cognitive: Alert and Oriented  Insight: Developing/Improving and Engaged  Engagement in Therapy: Developing/Improving and Engaged  Modes of Intervention: Activity, Clarification, Confrontation, Discussion, Education, Exploration, Limit-setting, Orientation, Problem-solving, Rapport Building, Reality Testing, Socialization and Support  Summary of Progress/Problems: Patient was attentive and engaged with speaker from [employer]. Patient was attentive to speaker while they shared their story of dealing with mental health and overcoming it. Patient expressed interest in their programs and services and received information on their agency. Patient processed ways they can relate to the speaker.   Alleyah Twombly, MSW, LCSWA Clinical Social Worker [facility] [phone]   

## 2014-10-04 NOTE — Progress Notes (Deleted)
The focus of this group is to educate the patient on the purpose and policies of crisis stabilization and provide a format to answer questions about their admission.  The group details unit policies and expectations of patients while admitted. Patient did not attend the 0900 nursing group. 

## 2014-10-04 NOTE — Clinical Social Work Note (Signed)
CSW left voicemail for patient's guardian Arnetha MassyBob Heilig [phone], awaiting return call.  Samuella BruinKristin Elvi Leventhal, MSW, [employer]CS[employer] Clinical Social Worker [facility] [phone]

## 2014-10-05 MED ORDER — BUTALBITAL-APAP-CAFFEINE 50-325-40 MG PO TABS
2.0000 | ORAL_TABLET | Freq: Four times a day (QID) | ORAL | Status: DC | PRN
  Administered 2014-10-05 – 2014-10-12 (×9): 2 via ORAL
  Filled 2014-10-05 (×10): qty 2

## 2014-10-05 NOTE — Progress Notes (Signed)
PATIENT REQUESTS THAT SW SEND LETTER TO COURT ON Monday TO EXPLAIN THAT SHE IS IN HOSPITAL.

## 2014-10-05 NOTE — Plan of Care (Signed)
Problem: Consults Goal: Depression Patient Education See Patient Education Module for education specifics.  Outcome: Completed/Met Date Met:  10/05/14 Nurse discussed depression with patient.

## 2014-10-05 NOTE — Progress Notes (Signed)
D:  Patient's self inventory sheet, patient sleeps good, no sleep medication given.  Fair appetite, low energy level, poor concentration.  Rated depression and hopeless 7, anxiety 5.  Denied withdrawals.  Has experienced agitation, irritability.  SI, contracts for safety.  Headache today, worst pain #5.  Goal is to find out more about memory loss, decrease obsessive thoughts of suicide.  Plans to attend groups/stay distracts.  No discharge plans yet.  No problems anticipated after discharge. A:  Medications administered per MD orders.  Emotional support and encouragement given patient. R:  Denied SI while talking to nurse.  Denied HI.  Denied A/V hallucinations.  Safety maintained with 15 minute checks.

## 2014-10-05 NOTE — [facility] Group Notes (Signed)
   [facility]  10/05/2014  8:45 AM   Participation Quality: Alert, Appropriate and Oriented  Mood/Affect: Depressed and Flat  Depression Rating: 6-7  Anxiety Rating: 5  Thoughts of Suicide: Pt endorses SI but can contract for safety  Will you contract for safety? Yes  Current AVH: Pt denies  Plan for Discharge/Comments: Pt attended discharge planning group and actively participated in group. CSW provided pt with today's workbook. Patient was tearful during group and reports feeling "the same" and endorses SI today. Patient plans to return home and will follow up with [name] at discharge. Patient reports that she had a good conversation with her family in [place] last night.  Transportation Means: Pt reports access to transportation  Supports: Patient identifies her family as supportive  Samuella BruinKristin Keithen Capo, MSW, [employer]CSWA Clinical Social Worker [facility] [phone]

## 2014-10-05 NOTE — Clinical Social Work Note (Signed)
CSW spoke with staff member at [name] to update them on her progress and discharge plans.   Samuella BruinKristin Kiyonna Tortorelli, MSW, [employer]CSWA Clinical Social Worker [facility] [phone]

## 2014-10-05 NOTE — Tx Team (Signed)
Interdisciplinary Treatment Plan Update (Adult) Date: 10/05/2014   Time Reviewed: 9:30 AM  Progress in Treatment: Attending groups: Yes Participating in groups: Yes Taking medication as prescribed: Yes Tolerating medication: Yes Family/Significant other contact made: No, CSW attempting to contact patient's guardian Arnetha MassyBob Heilig Patient understands diagnosis: Yes Discussing patient identified problems/goals with staff: Yes Medical problems stabilized or resolved: Yes Denies suicidal/homicidal ideation: No, endorses SI today Issues/concerns per patient self-inventory: Yes Other:  New problem(s) identified: N/A  Discharge Plan or Barriers:   3/4: Patient plans to return home and follow up with her [name] [name].  Patient continues to endorse SI at this time.  Reason for Continuation of Hospitalization:  Depression Anxiety Medication Stabilization   Comments: N/A  Estimated length of stay: 3-5 days  For review of initial/current patient goals, please see plan of care. Patient is a 49 year old Caucasian female admitted with SI, depression, and anxiety. Patient lives alone in a boarding house. She has a guardian/payee- Ross StoresBob Heilig. [name]. [facility] similar complaints. Patient will benefit from crisis stabilization, medication evaluation, group therapy, and psycho education in addition to case management for discharge planning. Patient and CSW reviewed pt's identified goals and treatment plan. Pt verbalized understanding and agreed to treatment plan.  Attendees: Patient:    Family:    Physician: Dr. Jama Flavorsobos; Dr. Dub MikesLugo 10/05/2014 9:30 AM  Nursing: Quintella ReichertBeverly Knight, Marzetta BoardChrista Dopson RN 10/05/2014 9:30 AM  Clinical Social Worker: Belenda CruiseKristin Colie Fugitt,  LCSWA 10/05/2014 9:30 AM  Other: Juline PatchQuylle Hodnett, LCSW 10/05/2014 9:30 AM  Other: Leisa LenzValerie Enoch, Vesta [facility] Liaison 10/05/2014 9:30 AM  Other: Onnie BoerJennifer Clark,  Case Manager 10/05/2014 9:30 AM  Other: Assunta FoundShuvon Rankin, May Augustin, NP 10/05/2014 9:30 AM  Other: Santa GeneraAnne Cunningham, LCSW 10/05/2014 9:30 AM  Other:    Other:     Scribe for Treatment Team:  Samuella BruinKristin Akshara Blumenthal, MSW, [employer]CSWA [phone]

## 2014-10-05 NOTE — Progress Notes (Signed)
D.  Pt flat on approach, brightens with conversation.  Positive for evening wrap up group, interacting appropriately on unit.  Denies SI/HI/hallucinations at this time.  Reports headache continued.  A  Support and encouragement offered.  Medication given as ordered for headache.  R.  Pt remains safe, will continue to monitor.

## 2014-10-05 NOTE — [facility] Group Notes (Signed)
[facility] LCSW Group Therapy 10/05/2014 1:15 PM Type of Therapy: Group Therapy Participation Level: Active  Participation Quality: Attentive, Sharing and Supportive  Affect: Depressed and Flat  Cognitive: Alert and Oriented  Insight: Developing/Improving and Engaged  Engagement in Therapy: Developing/Improving and Engaged  Modes of Intervention: Clarification, Confrontation, Discussion, Education, Exploration, Limit-setting, Orientation, Problem-solving, Rapport Building, Dance movement psychotherapisteality Testing, Socialization and Support  Summary of Progress/Problems: The topic for today was feelings about relapse. Pt discussed what relapse prevention is to them and identified triggers that they are on the path to relapse. Pt processed their feeling towards relapse and was able to relate to peers. Pt discussed coping skills that can be used for relapse prevention. Patient identified suicidal thoughts, experiencing high anxiety, isolating, and difficulty with ADL's as relapse behaviors. Patient identifies stress and change as triggers for relapse. Patient became tearful when discussing her fear of being alone and hopelessness that she will not get better. CSW and other group members provided emotional support and encouragement. Group members worked on a written recovery plan and engaged in grounding exercise.   Mary BruinKristin Mackena Harper, MSW, [employer]CSWA Clinical Social Worker [facility] [phone]

## 2014-10-05 NOTE — [facility] Suicide Risk Assessment (Signed)
[facility] INPATIENT:  Family/Significant Other Suicide Prevention Education  Suicide Prevention Education:  Education Completed; guardian Arnetha MassyBob Heilig [phone],  (name of family member/significant other) has been identified by the patient as the family member/significant other with whom the patient will be residing, and identified as the person(s) who will aid the patient in the event of a mental health crisis (suicidal ideations/suicide attempt).  With written consent from the patient, the family member/significant other has been provided the following suicide prevention education, prior to the and/or following the discharge of the patient.  The suicide prevention education provided includes the following:  Suicide risk factors  Suicide prevention and interventions  National Suicide Hotline telephone number  [facility] assessment telephone number  [facility] Emergency Assistance 911  [facility] and/or Residential Mobile Crisis Unit telephone number  Request made of family/significant other to:  Remove weapons (e.g., guns, rifles, knives), all items previously/currently identified as safety concern.    Remove drugs/medications (over-the-counter, prescriptions, illicit drugs), all items previously/currently identified as a safety concern.  The family member/significant other verbalizes understanding of the suicide prevention education information provided.  The family member/significant other agrees to remove the items of safety concern listed above.  Bekki Tavenner, [place] CarboKristin L 10/05/2014, 12:35 PM

## 2014-10-05 NOTE — Progress Notes (Signed)
[facility]  10/05/2014 1:09 PM Mary Harper  MRN:  [id number] Subjective:  Mary Harper states that she has been feeling persistently depressed, with racing thought. She still endorses suicidal ideations. She states she has a guardian ( cousin ) who was assigned to her when she got really sick and [facility]. She states she would like to eventually not to have a guardian. She admits that her drinking is binge drinking and that she has not have anything to drink in 3 weeks. She states that the triggers for her drinking are stress, "being manic." She cant say why is she feeling suicidal now, she just is. She went trough moving what was stressful, but now feels that she has a stable place a boarding house and she likes the other people who live there. She plans to be more active, volunteer in [employer], etc.  Principal Problem: Bipolar affective disorder, depressed, severe Diagnosis:   Patient Active Problem List   Diagnosis Date Noted  . Suicidal ideations [R45.851] 10/03/2014    Priority: High  . Bipolar affective disorder, depressed, severe [F31.4] 07/13/2014    Priority: High  . 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 Alcohol intoxication [F10.129]    Total Time spent with patient: 30 minutes   Past Medical History:  Past Medical History  Diagnosis Date  . Depression   . Bipolar 1 disorder   . Alcoholism   . PTSD (post-traumatic stress disorder)   . Carpal tunnel syndrome on both sides   . Gastric bypass status for obesity   . Anxiety    History reviewed. No pertinent past surgical history. Family History: History reviewed. No pertinent family history. Social History:  History  Alcohol Use  . Yes     History  Drug Use No    Comment: binge drink about every 3 weeks     History   Social History  . Marital Status: Single    Spouse Name: N/A  . Number of Children: N/A  . Years of Education: N/A   Social History Main Topics  .  Smoking status: Never Smoker   . Smokeless tobacco: Never Used  . Alcohol Use: Yes  . Drug Use: No     Comment: binge drink about every 3 weeks   . Sexual Activity: Not on file   Other Topics Concern  . None   Social History Narrative   Additional History:    Sleep: Fair  Appetite:  Fair   Assessment:   Musculoskeletal: Strength & Muscle Tone: within normal limits Gait & Station: normal Patient leans: N/A   Psychiatric Specialty Exam: Physical Exam  Review of Systems  Constitutional: Positive for malaise/fatigue.  Eyes: Negative.   Respiratory: Negative.   Cardiovascular: Negative.   Gastrointestinal: Negative.   Genitourinary: Negative.   Musculoskeletal: Negative.   Skin: Negative.   Neurological: Positive for weakness and headaches.  Endo/Heme/Allergies: Negative.   Psychiatric/Behavioral: Positive for depression, suicidal ideas and memory loss. The patient is nervous/anxious.     Blood pressure 101/55, pulse 79, temperature 99 F (37.2 C), temperature source Oral, resp. rate 16, height 5\' 3"  (1.6 m), weight 92.534 kg (204 lb), last menstrual period 09/09/2014.Body mass index is 36.15 kg/(m^2).  General Appearance: Fairly Groomed  Patent attorneyye Contact::  Fair  Speech:  Clear and Coherent  Volume:  Normal  Mood:  Depressed  Affect:  Restricted  Thought Process:  Coherent and Goal Directed  Orientation:  Full (Time, Place,  and Person)  Thought Content:  symptoms events worries concerns  Suicidal Thoughts:  Yes, but can contract for safety  Homicidal Thoughts:  No  Memory:  Immediate;   Fair Recent;   Fair Remote;   Fair  Judgement:  Fair  Insight:  Present  Psychomotor Activity:  Decreased  Concentration:  Fair  Recall:  [name] of Knowledge:Fair  Language: Fair  Akathisia:  No  Handed:  Right  AIMS (if indicated):     Assets:  Desire for Improvement Housing Social Support  ADL's:  Intact  Cognition: WNL  Sleep:  Number of Hours: 6     Current  Medications: Current Facility-Administered Medications  Medication Dose Route Frequency Provider Last Rate Last Dose  . acetaminophen (TYLENOL) tablet 650 mg  650 mg Oral Q6H PRN Kerry Hough, PA-C   650 mg at 10/05/14 0831  . alum & mag hydroxide-simeth (MAALOX/MYLANTA) 200-200-20 MG/5ML suspension 30 mL  30 mL Oral Q4H PRN Kerry Hough, PA-C      . ARIPiprazole (ABILIFY) tablet 2 mg  2 mg Oral Daily Rachael Fee, MD   2 mg at 10/05/14 0827  . butalbital-acetaminophen-caffeine (FIORICET, ESGIC) 50-325-40 MG per tablet 2 tablet  2 tablet Oral Q6H PRN Rachael Fee, MD   2 tablet at 10/05/14 1154  . FLUoxetine (PROZAC) capsule 20 mg  20 mg Oral Daily Kerry Hough, PA-C   20 mg at 10/05/14 0827  . gabapentin (NEURONTIN) capsule 300 mg  300 mg Oral TID Kerry Hough, PA-C   300 mg at 10/05/14 1151  . hydrOXYzine (ATARAX/VISTARIL) tablet 50 mg  50 mg Oral QHS PRN Kerry Hough, PA-C      . lithium carbonate capsule 900 mg  900 mg Oral QHS Rachael Fee, MD   900 mg at 10/04/14 2307  . lithium citrate 300 MG/5ML solution 900 mg  900 mg Oral QHS Rachael Fee, MD      . magnesium hydroxide (MILK OF MAGNESIA) suspension 30 mL  30 mL Oral Daily PRN Kerry Hough, PA-C      . prazosin (MINIPRESS) capsule 1 mg  1 mg Oral QHS Kerry Hough, PA-C   1 mg at 10/04/14 0351  . traZODone (DESYREL) tablet 100 mg  100 mg Oral QHS Kerry Hough, PA-C   100 mg at 10/03/14 2348  . Valproic Acid (DEPAKENE) 250 MG/5ML syrup SYRP 750 mg  750 mg Oral BID Kerry Hough, PA-C   750 mg at 10/05/14 9604    Lab Results:  Results for orders placed or performed during the hospital encounter of 10/03/14 (from the past 48 hour(s))  Urine Drug Screen     Status: Abnormal   Collection Time: 10/03/14  1:32 PM  Result Value Ref Range   Opiates NONE DETECTED NONE DETECTED   Cocaine NONE DETECTED NONE DETECTED   Benzodiazepines POSITIVE (A) NONE DETECTED   Amphetamines NONE DETECTED NONE DETECTED    Tetrahydrocannabinol NONE DETECTED NONE DETECTED   Barbiturates NONE DETECTED NONE DETECTED    Comment:        DRUG SCREEN FOR MEDICAL PURPOSES ONLY.  IF CONFIRMATION IS NEEDED FOR ANY PURPOSE, NOTIFY LAB WITHIN 5 DAYS.        LOWEST DETECTABLE LIMITS FOR URINE DRUG SCREEN Drug Class       Cutoff (ng/mL) Amphetamine      1000 Barbiturate      200 Benzodiazepine   200 Tricyclics  300 Opiates          300 Cocaine          300 THC              50     Physical Findings: AIMS: Facial and Oral Movements Muscles of Facial Expression: None, normal Lips and Perioral Area: None, normal Jaw: None, normal Tongue: None, normal,Extremity Movements Upper (arms, wrists, hands, fingers): None, normal Lower (legs, knees, ankles, toes): None, normal, Trunk Movements Neck, shoulders, hips: None, normal, Overall Severity Severity of abnormal movements (highest score from questions above): None, normal Incapacitation due to abnormal movements: None, normal Patient's awareness of abnormal movements (rate only patient's report): No Awareness, Dental Status Current problems with teeth and/or dentures?: No Does patient usually wear dentures?: No  CIWA:  CIWA-Ar Total: 1 COWS:  COWS Total Score: 1  Treatment Plan Summary: Daily contact with patient to assess and evaluate symptoms and progress in treatment and Medication management Bipolar Depression: will have a trial with Abilify small dosage. Will continue the Depakene and the Lithium. Will adjust the dose of Abilify, will optimize response Suicidal ideas: will provide a safe environment, she will go to staff is she feels she could act on the thoughts Alcohol Dependence (Binge pattern) will continue to work a relapse prevention plan   Medical Decision Making:  Review of Psycho-Social Stressors (1), Review of Medication Regimen & Side Effects (2) and Review of New Medication or Change in Dosage (2)     Aseneth Hack A 10/05/2014, 1:09  PM

## 2014-10-05 NOTE — [facility] Group Notes (Signed)
Adult Psychoeducational Group Note  Date:  10/05/2014 Time:  9:18 PM  Group Topic/Focus:  AA Meeting  Participation Level:  Minimal  Participation Quality:  Attentive  Affect:  Flat  Cognitive:  Alert  Insight: Limited  Engagement in Group:  Limited  Modes of Intervention:  Discussion and Education  Additional Comments:  Mikaelah attended group.  Caroll RancherLindsay, Sentoria Brent A 10/05/2014, 9:18 PM

## 2014-10-06 ENCOUNTER — Encounter (HOSPITAL_COMMUNITY): Payer: Self-pay | Admitting: Registered Nurse

## 2014-10-06 NOTE — Progress Notes (Signed)
Psychoeducational Group Note  Date:  10/06/2014 Time: 2100  Group Topic/Focus:  wrap up group  Participation Level: Did Not Attend  Participation Quality:  Not Applicable  Affect:  Not Applicable  Cognitive:  Not Applicable  Insight:  Not Applicable  Engagement in Group: Not Applicable  Additional Comments:  Pt remained in bed during group time.   Shelah LewandowskySquires, [place]verne Farone Carol 10/06/2014, 10:25 PM

## 2014-10-06 NOTE — [facility] Group Notes (Signed)
The focus of this group is to educate the patient on the purpose and policies of crisis stabilization and provide a format to answer questions about their admission.  The group details unit policies and expectations of patients while admitted.  Patient attended 0900 nurse education orientation group this morning.  Patient listened attentively, appropriate affect, alert, appropriate insight and engagement.  Today patient will work on 3 goals for discharge.  

## 2014-10-06 NOTE — Plan of Care (Signed)
Problem: Consults Goal: Suicide Risk Patient Education (See Patient Education module for education specifics)  Outcome: Completed/Met Date Met:  10/06/14 Nurse talked to patient about SI thoughts, depression this morning.

## 2014-10-06 NOTE — Progress Notes (Signed)
Patient ID: Mary Harper, female   DOB: 11-17-1965, 49 y.o.   MRN: [id number] [facility] MD Progress Note  10/06/2014 9:41 AM Mary Harper  MRN:  [id number]   Subjective:  Patient states that she came to hospital because of "suicidal ideation, worsening depression, and memory loss that has gotten a lot worse."  States that she continues to have suicidal ideation "worse because more specific was like racing thoughts; but since here I've been thinking of was more specific.  So I've been staying in the day room more and the CNA has been watching me also.   States that she did not sleep well last night.  Eating without difficulty.    Objective:  Patient seen and chart reviewed.  Attending groups sessions.  Continues to be depressed with low energy level.  Continues to endorse suicidal ideation without a specific plan but continues to try and think of ways to kill herself.  Denies homicidal ideation, psychosis, and paranoia.        Principal Problem: Bipolar affective disorder, depressed, severe Diagnosis:   Patient Active Problem List   Diagnosis Date Noted  . Suicidal ideations [R45.851] 10/03/2014  . 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 Bipolar affective disorder, depressed, severe [F31.4] 07/13/2014  . Alcohol intoxication [F10.129]    Total Time spent with patient: 30 minutes   Past Medical History:  Past Medical History  Diagnosis Date  . Depression   . Bipolar 1 disorder   . Alcoholism   . PTSD (post-traumatic stress disorder)   . Carpal tunnel syndrome on both sides   . Gastric bypass status for obesity   . Anxiety    History reviewed. No pertinent past surgical history. Family History: History reviewed. No pertinent family history. Social History:  History  Alcohol Use  . Yes     History  Drug Use No    Comment: binge drink about every 3 weeks     History   Social History  . Marital Status: Single    Spouse Name: N/A  . Number of Children: N/A  . Years  of Education: N/A   Social History Main Topics  . Smoking status: Never Smoker   . Smokeless tobacco: Never Used  . Alcohol Use: Yes  . Drug Use: No     Comment: binge drink about every 3 weeks   . Sexual Activity: Not on file   Other Topics Concern  . None   Social History Narrative   Additional History:    Sleep: Fair  Appetite:  Good   Assessment:   Musculoskeletal: Strength & Muscle Tone: within normal limits Gait & Station: normal Patient leans: N/A   Psychiatric Specialty Exam: Physical Exam  Constitutional: She is oriented to person, place, and time.  Neck: Normal range of motion.  Musculoskeletal: Normal range of motion.  Neurological: She is alert and oriented to person, place, and time.  Skin: Skin is warm and dry.  Psychiatric: Her speech is normal. She is withdrawn. Thought content is not paranoid and not delusional. She exhibits a depressed mood. She expresses suicidal ideation. She expresses no homicidal ideation. She exhibits abnormal recent memory (States "memory poor for the last 3-4 years but for the last couple of weeks it has gotten worse" ).    Review of Systems  HENT:       States that she also has an increase to loud or sudden noises.   Musculoskeletal: Negative.   Neurological: Positive for headaches.  Psychiatric/Behavioral: Positive  for depression, suicidal ideas, memory loss and substance abuse. Negative for hallucinations. The patient is nervous/anxious and has insomnia.     Blood pressure 99/68, pulse 81, temperature 98.4 F (36.9 C), temperature source Oral, resp. rate 16, height  (1.6 m), weight 92.534 kg (204 lb), last menstrual period 09/09/2014.Body mass index is 36.15 kg/(m^2).  General Appearance: Fairly Groomed  Patent attorney::  Fair  Speech:  Clear and Coherent  Volume:  Normal  Mood:  Depressed  Affect:  Depressed, Flat and Restricted  Thought Process:  Coherent and Goal Directed  Orientation:  Full (Time, Place, and  Person)  Thought Content:  symptoms events worries concerns  Suicidal Thoughts:  Yes, but can contract for safety  Homicidal Thoughts:  No  Memory:  Immediate;   Fair Recent;   Fair Remote;   Fair  Judgement:  Fair  Insight:  Present  Psychomotor Activity:  Decreased  Concentration:  Fair  Recall:  [name] of Knowledge:Fair  Language: Fair  Akathisia:  No  Handed:  Right  AIMS (if indicated):     Assets:  Desire for Improvement Housing Social Support  ADL's:  Intact  Cognition: WNL  Sleep:  Number of Hours: 6.25     Current Medications: Current Facility-Administered Medications  Medication Dose Route Frequency Provider Last Rate Last Dose  . acetaminophen (TYLENOL) tablet 650 mg  650 mg Oral Q6H PRN Kerry Hough, PA-C   650 mg at 10/05/14 0831  . alum & mag hydroxide-simeth (MAALOX/MYLANTA) 200-200-20 MG/5ML suspension 30 mL  30 mL Oral Q4H PRN Kerry Hough, PA-C      . ARIPiprazole (ABILIFY) tablet 2 mg  2 mg Oral Daily Rachael Fee, MD   2 mg at 10/06/14 1610  . butalbital-acetaminophen-caffeine (FIORICET, ESGIC) 50-325-40 MG per tablet 2 tablet  2 tablet Oral Q6H PRN Rachael Fee, MD   2 tablet at 10/06/14 0801  . FLUoxetine (PROZAC) capsule 20 mg  20 mg Oral Daily Kerry Hough, PA-C   20 mg at 10/06/14 9604  . gabapentin (NEURONTIN) capsule 300 mg  300 mg Oral TID Kerry Hough, PA-C   300 mg at 10/06/14 5409  . hydrOXYzine (ATARAX/VISTARIL) tablet 50 mg  50 mg Oral QHS PRN Kerry Hough, PA-C      . lithium citrate 300 MG/5ML solution 900 mg  900 mg Oral QHS Rachael Fee, MD   900 mg at 10/05/14 2107  . magnesium hydroxide (MILK OF MAGNESIA) suspension 30 mL  30 mL Oral Daily PRN Kerry Hough, PA-C      . prazosin (MINIPRESS) capsule 1 mg  1 mg Oral QHS Kerry Hough, PA-C   1 mg at 10/05/14 2108  . traZODone (DESYREL) tablet 100 mg  100 mg Oral QHS Kerry Hough, PA-C   100 mg at 10/05/14 2108  . Valproic Acid (DEPAKENE) 250 MG/5ML syrup SYRP  750 mg  750 mg Oral BID Kerry Hough, PA-C   750 mg at 10/06/14 8119    Lab Results:  No results found for this or any previous visit (from the past 48 hour(s)).  Physical Findings: AIMS: Facial and Oral Movements Muscles of Facial Expression: None, normal Lips and Perioral Area: None, normal Jaw: None, normal Tongue: None, normal,Extremity Movements Upper (arms, wrists, hands, fingers): None, normal Lower (legs, knees, ankles, toes): None, normal, Trunk Movements Neck, shoulders, hips: None, normal, Overall Severity Severity of abnormal movements (highest score from questions above): None,  normal Incapacitation due to abnormal movements: None, normal Patient's awareness of abnormal movements (rate only patient's report): No Awareness, Dental Status Current problems with teeth and/or dentures?: No Does patient usually wear dentures?: No  CIWA:  CIWA-Ar Total: 0 COWS:  COWS Total Score: 1  Treatment Plan Summary: Daily contact with patient to assess and evaluate symptoms and progress in treatment and Medication management Bipolar Depression: will have a trial with Abilify small dosage. Will continue the Depakene and the Lithium. Will adjust the dose of Abilify, will optimize response Suicidal ideas: will provide a safe environment, she will go to staff is she feels she could act on the thoughts Alcohol Dependence (Binge pattern) will continue to work a relapse prevention plan   Will continue with current treatment plan at this time.   Medical Decision Making:  Established Problem, Stable/Improving (1), Review of Psycho-Social Stressors (1), Review or order clinical lab tests (1), Review of Medication Regimen & Side Effects (2) and Review of New Medication or Change in Dosage (2)   Rankin, Shuvon, FNP-BC 10/06/2014, 9:41 AM

## 2014-10-06 NOTE — Progress Notes (Signed)
D:  Patient's self inventory sheet, patient had poor sleep last night, sleep med was helpful for awhile.  Good appetite, low energy level, poor concentration.  Rated depression 8, hopeless 7, anxiety 5.  Denied withdrawals.  Stated she had experienced diarrhea and irritability.  SI, contracts for safety.  Physical problems headaches, dizziness.  Physical pain worst #8, headache.  Medication is helpful.  Goal is to lessen suicidal thoughts and plans.  Plans to not stay alone,k remain distracted.  No discharge plan at this time.  No problems anticipated after discharge. A:  Medications administered per MD orders.  Emotional support and encouragement given patient. R:  Denied HI.  Denied A/V hallucinations.  Safety maintained with 15 minute checks.  Patient informed nurse this morning during med pass that she has been having SI thoughts to hang herself putting something over hinge of bathroom door to hurt herself.  Nurse talked to patient about her depression and anxiety.  Patient did contract for safety.  Charge nurse, MHT, SW, etc informed of patient's statements.  Patient is being observed with staff on unit.  Patient has been attending groups this morning.

## 2014-10-06 NOTE — [facility] Group Notes (Signed)
[facility] Group Notes:  (Clinical Social Work)  10/06/2014     10-11AM  Summary of Progress/Problems:   The main focus of today's process group was to learn how to use a decisional balance exercise to move forward in the Stages of Change, which were described and discussed.  Motivational Interviewing and a worksheet were utilized to help patients explore in depth the perceived benefits and costs of a self-sabotaging behavior, as well as the  benefits and costs of replacing that with a healthy coping mechanism.   The patient expressed a great deal of insight throughout the group, sharing suggestions with others as well as experiences she has had in the past.  She stated that one of her main unhealthy coping skills is using alcohol to escape reality.    Type of Therapy:  Group Therapy - Process   Participation Level:  Active  Participation Quality:  Attentive, Sharing and Supportive  Affect:  Blunted and Depressed  Cognitive:  Appropriate and Oriented  Insight:  Engaged  Engagement in Therapy:  Engaged  Modes of Intervention:  Education, Motivational Interviewing  Ambrose MantleMareida Grossman-Orr, LCSW 10/06/2014, 12:09 PM

## 2014-10-06 NOTE — Progress Notes (Signed)
D.  Pt stayed in bed for evening part of shift, was heard snoring loudly.  Pt did not attend evening wrap up group, and appeared very flat and depressed when she came to get her night time medications.  Pt does contract for safety and states that she is still having suicidal ideation.  Earlier today Pt told day shift RN as well as the NP that she has a very specific plan to hang self while in the hospital.  Pt stayed in dayroom during day shift more and was closely observed by MHT on hall. A.   MHT on hall this shift also made aware of Pt's statements as well as AC notified. Support and encouragement offered to Pt.  R.  Pt remains safe in room at this time, will continue to closely monitor.

## 2014-10-06 NOTE — [facility] Group Notes (Signed)
Adult Psychoeducational Group Note  Date:  10/06/2014 Time:  1330  Group Topic/Focus:  Life Skills  Participation Level:  Active  Participation Quality:  Appropriate  Affect:  Appropriate  Cognitive:  Appropriate  Insight: Appropriate  Engagement in Group:  Developing/Improving  Modes of Intervention:  Discussion  Additional Comments:  Appropriate  Earline MayotteKnight, Josealberto Montalto Shephard 10/06/2014, 6:05 PM

## 2014-10-07 NOTE — [facility] Group Notes (Addendum)
The focus of this group is to educate the patient on the purpose and policies of crisis stabilization and provide a format to answer questions about their admission.  The group details unit policies and expectations of patients while admitted.  Patient attended 0900 nurse education orientation group this morning.  Patient listened attentively, appropriate affect, alert, appropriate insight and engagement.  Today patient will work on 3 goals for discharge.  

## 2014-10-07 NOTE — Progress Notes (Signed)
Patient did not attend the evening speaker AA meeting. Pt was notified that group was beginning but remained in bed.   

## 2014-10-07 NOTE — Progress Notes (Signed)
D:  Patient's self inventory sheet, patient has poor sleep, sleep medication is not helpful.  Good appetite, low energy level, poor concentration.  Rated depression 8, hopeless and anxiety 7.  Denied withdrawals.  SI, contracts for safety.  Physical problems dizziness, headaches, stomach cramps, worst pain 7, pain medication is helpful.  Goal is to stop suicidal thoughts.  Plans to stay distracted, go to groups.  Still needs to make sure SW faxes court today, lots of anxiety about this.  No discharge plan at this time.  Was up and down during the night, decreased sleep, racing thoughts.   A:  Medications administered per MD orders.  Emotional support and encouragement given patient. R:  Denied HI.  Denied A/V hallucinations.  Safety maintained with 15 minute checks. Patient stated she has been SI off/on, no specific plan, contracts for safety.  Not as suicidal today as she was yesterday.  Basically feeling better today.  Continues to be concerned about her court date and needs court to know that she is in hospital and cannot attend hearing.

## 2014-10-07 NOTE — [facility] Group Notes (Signed)
Adult Psychoeducational Group Note  Date:  10/07/2014 Time:  1330  Group Topic/Focus:  Making Healthy Choices:   The focus of this group is to help patients identify negative/unhealthy choices they were using prior to admission and identify positive/healthier coping strategies to replace them upon discharge.  Participation Level:  Did Not Attend  Participation Quality:    Affect:    Cognitive:    Insight:   Engagement in Group:    Modes of Intervention:    Additional Comments:    Earline MayotteKnight, Saryah Loper Shephard 10/07/2014, 5:42 PM

## 2014-10-07 NOTE — [facility] Group Notes (Signed)
[facility] Group Notes:  (Clinical Social Work)  10/07/2014  10:00-11:00AM  Summary of Progress/Problems:   The main focus of today's process group was to   1)  discuss the importance of adding supports  2)  define health supports versus unhealthy supports  3)  identify the patient's current unhealthy supports and plan how to handle them  4)  Identify the patient's current healthy supports and plan what to add.  An emphasis was placed on using counselor, doctor, therapy groups, 12-step groups, and problem-specific support groups to expand supports.    The patient expressed full comprehension of the concepts presented, and agreed that there is a need to add more supports.  The patient stated her sole healthy support is her ACT Team.  She feels that past boyfriends who continue to call her are unhealthy supports, because sometimes she is so lonely she feels like reconnecting with them even though she knows they are not what she really wants.  She talked about having dissociative events, not remembering trips to the grocery store, not remembering calling people.  She plans to contact a neurologist for an evaluation to see if there is something going on neurologically that could be causing this.  She became tearful when talking about the fact that she does have a diagnosis of PTSD, which could be causing delayed dissociative symptoms.  Type of Therapy:  Process Group with Motivational Interviewing  Participation Level:  Active  Participation Quality:  Attentive and Sharing  Affect:  Blunted, Depressed and Tearful  Cognitive:  Appropriate and Oriented  Insight:  Engaged  Engagement in Therapy:  Engaged  Modes of Intervention:   Education, Support and Processing, Activity  Pilgrim's PrideMareida Grossman-Orr, LCSW 10/07/2014, 12:15pm

## 2014-10-07 NOTE — Plan of Care (Signed)
Problem: Consults Goal: Anxiety Disorder Patient Education See Patient Education Module for eduction specifics.  Outcome: Completed/Met Date Met:  10/07/14 Nurse discussed anxiety with patient.

## 2014-10-07 NOTE — Progress Notes (Signed)
D.  Pt sitting up in bed reading on approach, appears brighter than previous interactions.  Denies complaints at this time.  Pt did not attend evening wrap up group.  Minimal interaction on unit.  Denies SI/HI/hallucinations at this time, but does still endorse some passive SI at times.  A.  Support and encouragement offered  R.  Pt remains safe on unit, will continue to monitor.

## 2014-10-07 NOTE — Progress Notes (Signed)
Patient ID: Mary Harper, female   DOB: 1966-04-27, 49 y.o.   MRN: [id number] [facility] MD Progress Note  10/07/2014 4:02 PM Mary Harper  MRN:  [id number]   Subjective:  Patient states "I'm okay"  Patient states that she continues to have suicidal thoughts but not as bad as "they were yesterday.  Yesterday I sort of had a plan; but I've been able to contact for safety; Yesterday I was trying to think of ways to do it." Tolerating medications without adverse effects and participating in group sessions.   Principal Problem: Bipolar affective disorder, depressed, severe Diagnosis:   Patient Active Problem List   Diagnosis Date Noted  . Suicidal ideations [R45.851] 10/03/2014  . 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 Bipolar affective disorder, depressed, severe [F31.4] 07/13/2014  . Alcohol intoxication [F10.129]    Total Time spent with patient: 20 minutes   Past Medical History:  Past Medical History  Diagnosis Date  . Depression   . Bipolar 1 disorder   . Alcoholism   . PTSD (post-traumatic stress disorder)   . Carpal tunnel syndrome on both sides   . Gastric bypass status for obesity   . Anxiety    History reviewed. No pertinent past surgical history. Family History: History reviewed. No pertinent family history. Social History:  History  Alcohol Use  . Yes     History  Drug Use No    Comment: binge drink about every 3 weeks     History   Social History  . Marital Status: Single    Spouse Name: N/A  . Number of Children: N/A  . Years of Education: N/A   Social History Main Topics  . Smoking status: Never Smoker   . Smokeless tobacco: Never Used  . Alcohol Use: Yes  . Drug Use: No     Comment: binge drink about every 3 weeks   . Sexual Activity: Not on file   Other Topics Concern  . None   Social History Narrative   Additional History:    Sleep: Fair  Appetite:  Good   Assessment:   Musculoskeletal: Strength & Muscle Tone: within normal  limits Gait & Station: normal Patient leans: N/A   Psychiatric Specialty Exam: Physical Exam  Constitutional: She is oriented to person, place, and time.  Neck: Normal range of motion.  Musculoskeletal: Normal range of motion.  Neurological: She is alert and oriented to person, place, and time.  Skin: Skin is warm and dry.  Psychiatric: Her speech is normal. She is withdrawn. Thought content is not paranoid and not delusional. She exhibits a depressed mood. She expresses suicidal ideation. She expresses no homicidal ideation. She exhibits abnormal recent memory (States "memory poor for the last 3-4 years but for the last couple of weeks it has gotten worse" ).    ROS  Blood pressure 99/52, pulse 61, temperature 98.6 F (37 C), temperature source Oral, resp. rate 16, height  (1.6 m), weight 92.534 kg (204 lb), last menstrual period 09/09/2014.Body mass index is 36.15 kg/(m^2).  General Appearance: Fairly Groomed  Patent attorney::  Fair  Speech:  Clear and Coherent  Volume:  Normal  Mood:  Depressed  Affect:  Depressed, Flat and Restricted  Thought Process:  Coherent and Goal Directed  Orientation:  Full (Time, Place, and Person)  Thought Content:  symptoms events worries concerns  Suicidal Thoughts:  Yes, but can contract for safety  Homicidal Thoughts:  No  Memory:  Immediate;   Fair Recent;  Fair Remote;   Fair  Judgement:  Fair  Insight:  Present  Psychomotor Activity:  Decreased  Concentration:  Fair  Recall:  [employer]  Fund of Knowledge:Fair  Language: Fair  Akathisia:  No  Handed:  Right  AIMS (if indicated):     Assets:  Desire for Improvement Housing Social Support  ADL's:  Intact  Cognition: WNL  Sleep:  Number of Hours: 4.75     Current Medications: Current Facility-Administered Medications  Medication Dose Route Frequency Provider Last Rate Last Dose  . acetaminophen (TYLENOL) tablet 650 mg  650 mg Oral Q6H PRN Kerry HoughSpencer E Simon, PA-C   650 mg at 10/05/14  0831  . alum & mag hydroxide-simeth (MAALOX/MYLANTA) 200-200-20 MG/5ML suspension 30 mL  30 mL Oral Q4H PRN Kerry HoughSpencer E Simon, PA-C      . ARIPiprazole (ABILIFY) tablet 2 mg  2 mg Oral Daily Rachael FeeIrving A Lugo, MD   2 mg at 10/07/14 0808  . butalbital-acetaminophen-caffeine (FIORICET, ESGIC) 50-325-40 MG per tablet 2 tablet  2 tablet Oral Q6H PRN Rachael FeeIrving A Lugo, MD   2 tablet at 10/06/14 2104  . FLUoxetine (PROZAC) capsule 20 mg  20 mg Oral Daily Kerry HoughSpencer E Simon, PA-C   20 mg at 10/07/14 [id number]  . gabapentin (NEURONTIN) capsule 300 mg  300 mg Oral TID Kerry HoughSpencer E Simon, PA-C   300 mg at 10/07/14 1157  . hydrOXYzine (ATARAX/VISTARIL) tablet 50 mg  50 mg Oral QHS PRN Kerry HoughSpencer E Simon, PA-C      . lithium citrate 300 MG/5ML solution 900 mg  900 mg Oral QHS Rachael FeeIrving A Lugo, MD   900 mg at 10/06/14 2106  . magnesium hydroxide (MILK OF MAGNESIA) suspension 30 mL  30 mL Oral Daily PRN Kerry HoughSpencer E Simon, PA-C      . prazosin (MINIPRESS) capsule 1 mg  1 mg Oral QHS Kerry HoughSpencer E Simon, PA-C   1 mg at 10/06/14 2104  . traZODone (DESYREL) tablet 100 mg  100 mg Oral QHS Kerry HoughSpencer E Simon, PA-C   100 mg at 10/06/14 2104  . Valproic Acid (DEPAKENE) 250 MG/5ML syrup SYRP 750 mg  750 mg Oral BID Kerry HoughSpencer E Simon, PA-C   750 mg at 10/07/14 [id number]    Lab Results:  No results found for this or any previous visit (from the past 48 hour(s)).  Physical Findings: AIMS: Facial and Oral Movements Muscles of Facial Expression: None, normal Lips and Perioral Area: None, normal Jaw: None, normal Tongue: None, normal,Extremity Movements Upper (arms, wrists, hands, fingers): None, normal Lower (legs, knees, ankles, toes): None, normal, Trunk Movements Neck, shoulders, hips: None, normal, Overall Severity Severity of abnormal movements (highest score from questions above): None, normal Incapacitation due to abnormal movements: None, normal Patient's awareness of abnormal movements (rate only patient's report): No Awareness, Dental  Status Current problems with teeth and/or dentures?: No Does patient usually wear dentures?: No  CIWA:  CIWA-Ar Total: 1 COWS:  COWS Total Score: 1  Treatment Plan Summary: Daily contact with patient to assess and evaluate symptoms and progress in treatment and Medication management Bipolar Depression: will have a trial with Abilify small dosage. Will continue the Depakene and the Lithium. Will adjust the dose of Abilify, will optimize response Suicidal ideas: will provide a safe environment, she will go to staff is she feels she could act on the thoughts Alcohol Dependence (Binge pattern) will continue to work a relapse prevention plan   Continue with current treatment plan; no changes at this time.  Medical Decision Making:  Established Problem, Stable/Improving (1), Review of Psycho-Social Stressors (1), Review or order clinical lab tests (1), Review of Medication Regimen & Side Effects (2) and Review of New Medication or Change in Dosage (2)   Rankin, Shuvon, FNP-BC 10/07/2014, 4:02 PM

## 2014-10-08 MED ORDER — ARIPIPRAZOLE 5 MG PO TABS
5.0000 mg | ORAL_TABLET | Freq: Every day | ORAL | Status: DC
  Administered 2014-10-09 – 2014-10-15 (×7): 5 mg via ORAL
  Filled 2014-10-08 (×10): qty 1

## 2014-10-08 NOTE — Progress Notes (Signed)
Patient ID: Mary Harper, female   DOB: [birth year], 49 y.o.   MRN: [id number] PER STATE REGULATIONS 482.30  THIS CHART WAS REVIEWED FOR MEDICAL NECESSITY WITH RESPECT TO THE PATIENT'S ADMISSION/ DURATION OF STAY.  NEXT REVIEW DATE: 10/11/2014  Willa RoughJENNIFER JONES Laker Thompson, RN, BSN CASE MANAGER

## 2014-10-08 NOTE — Progress Notes (Signed)
[facility] MD Progress Note  10/08/2014 3:58 PM Mary Harper  MRN:  [id number] Subjective:  Mary Harper states that she is still having suicidal ideas. States that Saturday was very bad. She was trying to figure out ways of doing it while here. Sates that she contracted with the nurses so she will go to them if she could not handle the thoughts. She has continued to experience racing thoughts.  States that the same day she had a "blackout" from when she woke up trough the afternoon. States she was told she went to the group, went to the cafeteria and has no recollection of doing so. States that these episodes of "blacking out" are happening more often and are pretty new. She is concerned. In her HS years she had a lot of headaches and she had a CT Scan that was negative.  Principal Problem: Bipolar affective disorder, depressed, severe Diagnosis:   Patient Active Problem List   Diagnosis Date Noted  . Suicidal ideations [R45.851] 10/03/2014    Priority: High  . Bipolar affective disorder, depressed, severe [F31.4] 07/13/2014    Priority: High  . 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 Alcohol intoxication [F10.129]    Total Time spent with patient: 30 minutes   Past Medical History:  Past Medical History  Diagnosis Date  . Depression   . Bipolar 1 disorder   . Alcoholism   . PTSD (post-traumatic stress disorder)   . Carpal tunnel syndrome on both sides   . Gastric bypass status for obesity   . Anxiety    History reviewed. No pertinent past surgical history. Family History: History reviewed. No pertinent family history. Social History:  History  Alcohol Use  . Yes     History  Drug Use No    Comment: binge drink about every 3 weeks     History   Social History  . Marital Status: Single    Spouse Name: N/A  . Number of Children: N/A  . Years of Education: N/A   Social History Main Topics  . Smoking status: Never Smoker   . Smokeless tobacco: Never Used  . Alcohol Use:  Yes  . Drug Use: No     Comment: binge drink about every 3 weeks   . Sexual Activity: Not on file   Other Topics Concern  . None   Social History Narrative   Additional History:    Sleep: Fair  Appetite:  Fair   Assessment:   Musculoskeletal: Strength & Muscle Tone: within normal limits Gait & Station: normal Patient leans: N/A   Psychiatric Specialty Exam: Physical Exam  Review of Systems  Constitutional: Positive for malaise/fatigue.  Eyes: Negative.   Respiratory: Negative.   Cardiovascular: Negative.   Gastrointestinal: Negative.   Genitourinary: Negative.   Musculoskeletal: Negative.   Skin: Negative.   Neurological: Positive for weakness and headaches.  Endo/Heme/Allergies: Negative.   Psychiatric/Behavioral: Positive for depression and suicidal ideas. The patient is nervous/anxious and has insomnia.     Blood pressure 98/52, pulse 68, temperature 98.7 F (37.1 C), temperature source Oral, resp. rate 18, height  (1.6 m), weight 92.534 kg (204 lb), last menstrual period 09/09/2014.Body mass index is 36.15 kg/(m^2).  General Appearance: Fairly Groomed  Patent attorney::  Fair  Speech:  Clear and Coherent, Slow and not spontaneous  Volume:  Decreased  Mood:  Anxious and Depressed  Affect:  Restricted  Thought Process:  Coherent and Goal Directed  Orientation:  Full (Time, Place, and Person)  Thought Content:  symptoms worries concerns  Suicidal Thoughts:  Yes.  without intent/plan  Homicidal Thoughts:  No  Memory:  Immediate;   Fair Recent;   Fair Remote;   Fair  Judgement:  Fair  Insight:  Present and Shallow  Psychomotor Activity:  Restlessness  Concentration:  Fair  Recall:  [employer]  Fund of Knowledge:Fair  Language: Fair  Akathisia:  No  Handed:  Right  AIMS (if indicated):     Assets:  Desire for Improvement Housing Social Support  ADL's:  Intact  Cognition: WNL  Sleep:  Number of Hours: 5.5     Current Medications: Current  Facility-Administered Medications  Medication Dose Route Frequency Provider Last Rate Last Dose  . acetaminophen (TYLENOL) tablet 650 mg  650 mg Oral Q6H PRN Kerry HoughSpencer E Simon, PA-C   650 mg at 10/05/14 0831  . alum & mag hydroxide-simeth (MAALOX/MYLANTA) 200-200-20 MG/5ML suspension 30 mL  30 mL Oral Q4H PRN Kerry HoughSpencer E Simon, PA-C      . [START ON 10/09/2014] ARIPiprazole (ABILIFY) tablet 5 mg  5 mg Oral Daily Rachael FeeIrving A Delisha Peaden, MD      . butalbital-acetaminophen-caffeine (FIORICET, ESGIC) [phone] MG per tablet 2 tablet  2 tablet Oral Q6H PRN Rachael FeeIrving A Emoni Yang, MD   2 tablet at 10/07/14 1713  . FLUoxetine (PROZAC) capsule 20 mg  20 mg Oral Daily Kerry HoughSpencer E Simon, PA-C   20 mg at 10/08/14 0747  . gabapentin (NEURONTIN) capsule 300 mg  300 mg Oral TID Kerry HoughSpencer E Simon, PA-C   300 mg at 10/08/14 1201  . hydrOXYzine (ATARAX/VISTARIL) tablet 50 mg  50 mg Oral QHS PRN Kerry HoughSpencer E Simon, PA-C      . lithium citrate 300 MG/5ML solution 900 mg  900 mg Oral QHS Rachael FeeIrving A Khamiya Varin, MD   900 mg at 10/07/14 2126  . magnesium hydroxide (MILK OF MAGNESIA) suspension 30 mL  30 mL Oral Daily PRN Kerry HoughSpencer E Simon, PA-C      . prazosin (MINIPRESS) capsule 1 mg  1 mg Oral QHS Kerry HoughSpencer E Simon, PA-C   1 mg at 10/07/14 2126  . traZODone (DESYREL) tablet 100 mg  100 mg Oral QHS Kerry HoughSpencer E Simon, PA-C   100 mg at 10/07/14 2126  . Valproic Acid (DEPAKENE) 250 MG/5ML syrup SYRP 750 mg  750 mg Oral BID Kerry HoughSpencer E Simon, PA-C   750 mg at 10/08/14 [id number]    Lab Results: No results found for this or any previous visit (from the past 48 hour(s)).  Physical Findings: AIMS: Facial and Oral Movements Muscles of Facial Expression: None, normal Lips and Perioral Area: None, normal Jaw: None, normal Tongue: None, normal,Extremity Movements Upper (arms, wrists, hands, fingers): None, normal Lower (legs, knees, ankles, toes): None, normal, Trunk Movements Neck, shoulders, hips: None, normal, Overall Severity Severity of abnormal movements (highest  score from questions above): None, normal Incapacitation due to abnormal movements: None, normal Patient's awareness of abnormal movements (rate only patient's report): No Awareness, Dental Status Current problems with teeth and/or dentures?: No Does patient usually wear dentures?: No  CIWA:  CIWA-Ar Total: 1 COWS:  COWS Total Score: 1  Treatment Plan Summary: Daily contact with patient to assess and evaluate symptoms and progress in treatment and Medication management Supportive approach/coping skills/relapse prevention Bipolar depressed: will increase the Abilify to 5 mg daily. She has tolerated the 2 mg well so far.  "Blackouts" will evaluate further, will consider a CT Scan Suicidal ideas: will continue to ask her to contract  for sagety  Medical Decision Making:  Review of Psycho-Social Stressors (1), Review or order clinical lab tests (1), Established Problem, Worsening (2), Review of Medication Regimen & Side Effects (2) and Review of New Medication or Change in Dosage (2)     Yuko Coventry A 10/08/2014, 3:58 PM

## 2014-10-08 NOTE — Plan of Care (Signed)
Problem: Ineffective individual coping Goal: STG: Patient will remain free from self harm Outcome: Completed/Met Date Met:  10/08/14 Nurse discussed SI thoughts with patient.     

## 2014-10-08 NOTE — Progress Notes (Addendum)
D:  Patient's self inventory sheet, patient has poor sleep, has nightmares about suicide, sleep medication is not helpful.  Good appetite, low energy, poor concentration.  Rated depression and hopeless 9, anxiety 7.  Denied withdrawals.  SI, racing thoughts, off/on.  Physical problems, dizziness, headaches, nervous stomach.  Physical pain #5, headaches.  Goal is to lessen her depression/ suicidal thoughts.  Plans to go to groups, journal, stay distracted, not isolate.  Depression does not seem to lift and that is causing anxiety.  No discharge plans.  No problems anticipated after discharge. A:  Medications administered per MD orders.  Emotional support and encouragement given patient. R:  Denied HI.  Denied A/V hallucinations.  SI, off/on, contracts for safety.  Safety maintained with 15 minute checks.

## 2014-10-08 NOTE — Progress Notes (Signed)
D   Pt has been isolated in her room the entire shift   Discussed medications with pt and prompted her twice to come to the medication window to get medications but she did not come   She actively participates during the day  She did agree to come get her medication but failed to do so A    Verbal support and encouragement given to patient   Medications offered and educated on theraputic use    Q 15 min checks R    Pt safe at present

## 2014-10-08 NOTE — Progress Notes (Signed)
Adult Psychoeducational Group Note  Date:  10/08/2014 Time:  9:31 PM  Group Topic/Focus:  Wrap-Up Group:   The focus of this group is to help patients review their daily goal of treatment and discuss progress on daily workbooks.  Participation Level:  Did Not Attend  Participation Quality:  did not attend  Affect:  did not attend  Cognitive:  did not attend  Insight: None  Engagement in Group:  did not attend  Modes of Intervention:  did not attend  Additional Comments:  Pt did not attend group  Armin Yerger 10/08/2014, 9:31 PM

## 2014-10-08 NOTE — [facility] Group Notes (Signed)
[facility] LCSW Group Therapy 10/08/2014  1:15 pm  Type of Therapy: Group Therapy Participation Level: Active  Participation Quality: Attentive, Sharing and Supportive  Affect: Depressed and Flat  Cognitive: Alert and Oriented  Insight: Developing/Improving and Engaged  Engagement in Therapy: Developing/Improving and Engaged  Modes of Intervention: Clarification, Confrontation, Discussion, Education, Exploration,  Limit-setting, Orientation, Problem-solving, Rapport Building, Dance movement psychotherapisteality Testing, Socialization and Support  Summary of Progress/Problems: Pt identified obstacles faced currently and processed barriers involved in overcoming these obstacles. Pt identified steps necessary for overcoming these obstacles and explored motivation (internal and external) for facing these difficulties head on. Pt further identified one area of concern in their lives and chose a goal to focus on for today. Patient identified her short term goal as to not think about harming herself. Patient reports that she will do this by participating in groups and sitting in the dayroom with others as opposed isolating in her room. She is able to contract for safety. She reports that knowing that staff care about her and want her to get better gives her hope for today. Patient became tearful during discussion. CSW and other group members provided patient with emotional support and encouragement.   Mary Harper, MSW, [employer]CSWA Clinical Social Worker [facility] [phone]

## 2014-10-08 NOTE — [facility] Group Notes (Signed)
   [facility]  10/08/2014  8:45 AM   Participation Quality: Alert, Appropriate and Oriented  Mood/Affect: Depressed and Flat  Depression Rating: 8-9  Anxiety Rating: 6-7  Thoughts of Suicide: Pt endorses SI  Will you contract for safety? Yes  Current AVH: Pt denies  Plan for Discharge/Comments: Pt attended discharge planning group and actively participated in group. CSW provided pt with today's workbook. Patient reports feeling depressed and anxious today and has been experiencing nightmares, racing thoughts, and has not been sleeping well. Patient plans to return to her apartment to follow up with [name].  Transportation Means: Pt reports access to transportation  Supports: No supports mentioned at this time  Samuella BruinKristin Maryland Stell, MSW, [employer]CSWA Clinical Social Worker [facility] [phone]

## 2014-10-09 MED ORDER — BUPROPION HCL ER (XL) 150 MG PO TB24
150.0000 mg | ORAL_TABLET | Freq: Every day | ORAL | Status: DC
  Administered 2014-10-10 – 2014-10-15 (×6): 150 mg via ORAL
  Filled 2014-10-09 (×8): qty 1

## 2014-10-09 MED ORDER — TRAZODONE HCL 150 MG PO TABS
150.0000 mg | ORAL_TABLET | Freq: Every day | ORAL | Status: DC
  Administered 2014-10-09 – 2014-10-14 (×6): 150 mg via ORAL
  Filled 2014-10-09 (×9): qty 1

## 2014-10-09 NOTE — Progress Notes (Signed)
Patient stated if she does go to sleep after dinner, requests night nurse to wake her up for night medications.

## 2014-10-09 NOTE — Progress Notes (Signed)
Radiology [phone] - Nurse can call Radiology phone number tomorrow Wednesday 10/10/2014 to set up appt for this pt [facility], can call anytime after 0700.  Procedure will take approximately 30 minutes. Nurse talked to Shanna/Radiology

## 2014-10-09 NOTE — Progress Notes (Signed)
[facility]  10/09/2014 5:09 PM Mary Harper  MRN:  [id number] Subjective:  Mary Harper continues to endorse feeling extremely depressed. States she tries to get up go to the groups but she has little tolerance to be around other people expecting to participate interact. States she does not have the energy or the motivation to do so. She continues to express concerns about her memory loss and episodes of "blacking out" and disorientation.  Principal Problem: Bipolar affective disorder, depressed, severe Diagnosis:   Patient Active Problem List   Diagnosis Date Noted  . Suicidal ideations [R45.851] 10/03/2014    Priority: High  . Bipolar affective disorder, depressed, severe [F31.4] 07/13/2014    Priority: High  . 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 Alcohol intoxication [F10.129]    Total Time spent with patient: 30 minutes   Past Medical History:  Past Medical History  Diagnosis Date  . Depression   . Bipolar 1 disorder   . Alcoholism   . PTSD (post-traumatic stress disorder)   . Carpal tunnel syndrome on both sides   . Gastric bypass status for obesity   . Anxiety    History reviewed. No pertinent past surgical history. Family History: History reviewed. No pertinent family history. Social History:  History  Alcohol Use  . Yes     History  Drug Use No    Comment: binge drink about every 3 weeks     History   Social History  . Marital Status: Single    Spouse Name: N/A  . Number of Children: N/A  . Years of Education: N/A   Social History Main Topics  . Smoking status: Never Smoker   . Smokeless tobacco: Never Used  . Alcohol Use: Yes  . Drug Use: No     Comment: binge drink about every 3 weeks   . Sexual Activity: Not on file   Other Topics Concern  . None   Social History Narrative   Additional History:    Sleep: Poor  Appetite:  Fair   Assessment:   Musculoskeletal: Strength & Muscle Tone: within normal limits Gait & Station:  normal Patient leans: N/A   Psychiatric Specialty Exam: Physical Exam  Review of Systems  Constitutional: Positive for malaise/fatigue.  HENT: Negative.   Eyes: Negative.   Respiratory: Negative.   Cardiovascular: Negative.   Gastrointestinal: Negative.   Genitourinary: Negative.   Musculoskeletal: Negative.   Skin: Negative.   Neurological: Negative.   Endo/Heme/Allergies: Negative.   Psychiatric/Behavioral: Positive for depression and suicidal ideas. The patient is nervous/anxious and has insomnia.     Blood pressure 101/72, pulse 81, temperature 98 F (36.7 C), temperature source Oral, resp. rate 16, height 5\' 3"  (1.6 m), weight 92.534 kg (204 lb), last menstrual period 09/09/2014.Body mass index is 36.15 kg/(m^2).  General Appearance: Fairly Groomed  Patent attorneyye Contact::  Fair  Speech:  Clear and Coherent, Slow and not spontaneous, reserved guarded  Volume:  Decreased  Mood:  Anxious and Depressed  Affect:  Restricted  Thought Process:  Coherent and Goal Directed  Orientation:  Full (Time, Place, and Person)  Thought Content:  symptoms events worries concerns  Suicidal Thoughts:  Yes.  without intent/plan  Homicidal Thoughts:  No  Memory:  Immediate;   Fair Recent;   Fair Remote;   Fair  Judgement:  Fair  Insight:  Present and Shallow  Psychomotor Activity:  Decreased  Concentration:  Poor  Recall:  [employer]Fair  Fund of Knowledge:Fair  Language: Fair  Akathisia:  No  Handed:  Right  AIMS (if indicated):     Assets:  Desire for Improvement Housing Social Support  ADL's:  Intact  Cognition: WNL  Sleep:  Number of Hours: 5.5     Current Medications: Current Facility-Administered Medications  Medication Dose Route Frequency Provider Last Rate Last Dose  . acetaminophen (TYLENOL) tablet 650 mg  650 mg Oral Q6H PRN Kerry Hough, PA-C   650 mg at 10/05/14 0831  . alum & mag hydroxide-simeth (MAALOX/MYLANTA) 200-200-20 MG/5ML suspension 30 mL  30 mL Oral Q4H PRN Kerry Hough, PA-C      . ARIPiprazole (ABILIFY) tablet 5 mg  5 mg Oral Daily Rachael Fee, MD   5 mg at 10/09/14 0715  . [START ON 10/10/2014] buPROPion (WELLBUTRIN XL) 24 hr tablet 150 mg  150 mg Oral Daily Rachael Fee, MD      . butalbital-acetaminophen-caffeine (FIORICET, ESGIC) [phone] MG per tablet 2 tablet  2 tablet Oral Q6H PRN Rachael Fee, MD   2 tablet at 10/07/14 1713  . FLUoxetine (PROZAC) capsule 20 mg  20 mg Oral Daily Kerry Hough, PA-C   20 mg at 10/09/14 0716  . gabapentin (NEURONTIN) capsule 300 mg  300 mg Oral TID Kerry Hough, PA-C   300 mg at 10/09/14 1207  . hydrOXYzine (ATARAX/VISTARIL) tablet 50 mg  50 mg Oral QHS PRN Kerry Hough, PA-C      . lithium citrate 300 MG/5ML solution 900 mg  900 mg Oral QHS Rachael Fee, MD   900 mg at 10/07/14 2126  . magnesium hydroxide (MILK OF MAGNESIA) suspension 30 mL  30 mL Oral Daily PRN Kerry Hough, PA-C      . prazosin (MINIPRESS) capsule 1 mg  1 mg Oral QHS Kerry Hough, PA-C   1 mg at 10/07/14 2126  . traZODone (DESYREL) tablet 150 mg  150 mg Oral QHS Rachael Fee, MD      . Valproic Acid (DEPAKENE) 250 MG/5ML syrup SYRP 750 mg  750 mg Oral BID Kerry Hough, PA-C   750 mg at 10/09/14 4540    Lab Results: No results found for this or any previous visit (from the past 48 hour(s)).  Physical Findings: AIMS: Facial and Oral Movements Muscles of Facial Expression: None, normal Lips and Perioral Area: None, normal Jaw: None, normal Tongue: None, normal,Extremity Movements Upper (arms, wrists, hands, fingers): None, normal Lower (legs, knees, ankles, toes): None, normal, Trunk Movements Neck, shoulders, hips: None, normal, Overall Severity Severity of abnormal movements (highest score from questions above): None, normal Incapacitation due to abnormal movements: None, normal Patient's awareness of abnormal movements (rate only patient's report): No Awareness, Dental Status Current problems with teeth and/or  dentures?: No Does patient usually wear dentures?: No  CIWA:  CIWA-Ar Total: 1 COWS:  COWS Total Score: 2  Treatment Plan Summary: Daily contact with patient to assess and evaluate symptoms and progress in treatment and Medication management Supportive approach/coping skills/relapse prevention Bipolar Depression: will continue the Lithium, Depakene, as well as the Abilify at 5 mg. The depression (anergic, retarded) seems not to be getting any better. We are going ahead and try Wellbutrin XL 150 mg daily. She has had some negative experiences with SSRI's but has never tried Wellbutrin We are going to go ahead and schedule the head CT to R/O other organic causes for her symptoms  Medical Decision Making:  Review of Psycho-Social Stressors (1), Review  or order clinical lab tests (1), Review of Medication Regimen & Side Effects (2) and Review of New Medication or Change in Dosage (2)     Roseanne Juenger A 10/09/2014, 5:09 PM

## 2014-10-09 NOTE — Progress Notes (Signed)
D   Pt is awake and agreeable to take medications early since she is sleepy instead of missing them because she fell asleep    A   Verbal support given   Medications administered and effectiveness monitored   Discussed heat ct with pt and she requested she be able to eat breakfast and shower before going  She said she would be ready by 8am   Q 15 min checks R  Pt is safe at present and was assured she couold go for head ct after 8am

## 2014-10-09 NOTE — [facility] Group Notes (Signed)
[facility] LCSW Group Therapy  10/09/2014   1:15 PM   Type of Therapy:  Group Therapy  Participation Level:  Active  Participation Quality:  Attentive, Sharing and Supportive  Affect:  Depressed and Flat  Cognitive:  Alert and Oriented  Insight:  Developing/Improving and Engaged  Engagement in Therapy:  Developing/Improving and Engaged  Modes of Intervention:  Clarification, Confrontation, Discussion, Education, Exploration, Limit-setting, Orientation, Problem-solving, Rapport Building, Dance movement psychotherapisteality Testing, Socialization and Support  Summary of Progress/Problems: The topic for group therapy was feelings about diagnosis.  Pt actively participated in group discussion on their past and current diagnosis and how they feel towards this.  Pt also identified how society and family members judge them, based on their diagnosis as well as stereotypes and stigmas.  Patient discussed having difficulty expressing how she feels to others. She also shared about her constant SI thoughts and how people's advice to think about her family is often unhelpful. Patient shared that she has had times of recovery in the past and is hopeful that she can make improvements once again. CSW and other group members provided patient with emotional support and encouragement.  Mary Harper, MSW, [employer]CSWA Clinical Social Worker [facility] [phone]

## 2014-10-09 NOTE — Progress Notes (Signed)
Recreation Therapy Notes  Animal-Assisted Activity/Therapy (AAA/T) Program Checklist/Progress Notes Patient Eligibility Criteria Checklist & Daily Group note for Rec Tx Intervention  Date: 03.08.2016 Time: 2:45pm [address]    AAA/T Program Assumption of Risk Form signed by Patient/ or Parent Legal Guardian yes  Patient is free of allergies or sever asthma yes  Patient reports no fear of animals yes  Patient reports no history of cruelty to animals yes  Patient understands his/her participation is voluntary yes  Patient washes hands before animal contact yes  Patient washes hands after animal contact yes  Behavioral Response: Appropriate   Education: Hand Washing, Appropriate Animal Interaction   Education Outcome: Acknowledges education.   Clinical Observations/Feedback: Patient pet therapy dog appropriately from floor level and engaged with peers appropriately during session.  Marykay Lexenise L Cade Olberding, LRT/CTRS  Kalah Pflum L 10/09/2014 4:53 PM

## 2014-10-09 NOTE — Plan of Care (Signed)
Problem: Consults Goal: Substance Abuse Patient Education See Patient Education Module for education specifics.  Outcome: Completed/Met Date Met:  10/09/14 Nurse discussed medications with patient.

## 2014-10-09 NOTE — Tx Team (Signed)
Interdisciplinary Treatment Plan Update (Adult) Date: 10/09/2014   Time Reviewed: 9:30 AM  Progress in Treatment: Attending groups: Yes Participating in groups: Yes Taking medication as prescribed: Yes Tolerating medication: Yes Family/Significant other contact made: Yes, CSW has spoken to patient's guardian Arnetha MassyBob Heilig Patient understands diagnosis: Yes Discussing patient identified problems/goals with staff: Yes Medical problems stabilized or resolved: Yes Denies suicidal/homicidal ideation: No, endorses SI today Issues/concerns per patient self-inventory: Yes Other:  New problem(s) identified: N/A  Discharge Plan or Barriers:   3/4: Patient plans to return home and follow up with her [name].  Patient continues to endorse SI at this time.  3/8: Patient plans to return home and follow up with her [name].  Patient continues to remain tearful at times and endorses depressive symptoms and SI at this time. MD is going to order cat scan or MRI due to cognitive deficits and "black outs".   Reason for Continuation of Hospitalization:  Depression Anxiety Medication Stabilization   Comments: N/A  Estimated length of stay: 3-5 days  For review of initial/current patient goals, please see plan of care. Patient is a 49 year old Caucasian female admitted with SI, depression, and anxiety. Patient lives alone in a boarding house. She has a guardian/payee- Ross StoresBob Heilig. [name]. [facility] similar complaints. Patient will benefit from crisis stabilization, medication evaluation, group therapy, and psycho education in addition to case management for discharge planning. Patient and CSW reviewed pt's identified goals and treatment plan. Pt verbalized understanding and agreed to treatment plan.  Attendees: Patient:    Family:    Physician: Dr. Jama Flavorsobos; Dr. Dub MikesLugo 10/09/2014  9:30 AM  Nursing: Liborio NixonPatrice White, Christa Dopson, Quintella ReichertBeverly Knight, [place]any Tyson, [place] 10/09/2014 9:30 AM  Clinical Social Worker: Samuella BruinKristin Desha Bitner,  LCSWA 10/09/2014 9:30 AM  Other: Juline PatchQuylle Hodnett, LCSW 10/09/2014 9:30 AM  Other:  Trula SladeHeather Smart, LCSWA 10/09/2014 9:30 AM  Other: Onnie BoerJennifer Clark, Case Manager 10/09/2014 9:30 AM  Other: Serena ColonelAggie Nwoko, NP 10/09/2014 9:30 AM  Other:    Other:    Other:     Other:     Scribe for Treatment Team:  Samuella BruinKristin Winferd Wease, MSW, Am[employer] IncLCSWA [phone]

## 2014-10-09 NOTE — Progress Notes (Signed)
D:  Patient's self inventory sheet, patient has poor sleep, no sleep medication given.  Fair appetite, low energy level, poor concentration.  Rate depression 9, hopeless 8, anxiety 5.  Denied withdrawals.  SI, contracts for safety.  Stated she does have dizziness and headaches at times.  Worst pain #4.  Goal is to find out the underlying causes for SI.  Plans to journal, talk to others.  "I'm still having racing thoughts about suicide and feel like I have a black cloud around me."   Does not have discharge plans.  No problems anticipated after discharge. A:  Medications administered per MD orders.  Emotional support and encouragement given patient. R:  Denied HI.  Denied A/V hallucinations.  Safety maintained with 15 minute checks. SI, contracts for safety, no plan.  Patient stated she deals with loneliness.  Will discuss her feelings with MD.  Also that her maternal grandfather stepped in front of a truck when he was in his 4750's.

## 2014-10-09 NOTE — [facility] Group Notes (Signed)
The focus of this group is to educate the patient on the purpose and policies of crisis stabilization and provide a format to answer questions about their admission.  The group details unit policies and expectations of patients while admitted.  Patient attended 0900 nurse education orientation group this morning.  Patient actively participated, appropriate affect, alert, appropriate insight and engagement.  Today patient will work on 3 goals for discharge.  

## 2014-10-10 ENCOUNTER — Ambulatory Visit (HOSPITAL_COMMUNITY)
Admit: 2014-10-10 | Discharge: 2014-10-10 | Disposition: A | Discharge status: Home / Self Care | Payer: Medicare Other | Attending: Psychiatry | Admitting: Psychiatry

## 2014-10-10 ENCOUNTER — Encounter (HOSPITAL_COMMUNITY): Payer: Self-pay

## 2014-10-10 DIAGNOSIS — R51 Headache: Secondary | ICD-10-CM | POA: Insufficient documentation

## 2014-10-10 MED ORDER — ESZOPICLONE 2 MG PO TABS
3.0000 mg | ORAL_TABLET | Freq: Every day | ORAL | Status: DC
  Administered 2014-10-10 – 2014-10-14 (×5): 3 mg via ORAL
  Filled 2014-10-10 (×5): qty 2

## 2014-10-10 NOTE — [facility] Group Notes (Signed)
[facility] LCSW Group Therapy  10/10/2014 2:55 PM  Type of Therapy:  Group Therapy  Participation Level:  Active  Participation Quality:  Attentive  Affect:  Appropriate  Cognitive:  Alert and Oriented  Insight:  Improving  Engagement in Therapy:  Engaged  Modes of Intervention:  Confrontation, Discussion, Education, Exploration, Problem-solving, Rapport Building, Socialization and Support  Summary of Progress/Problems: Today's Topic: Overcoming Obstacles. Pt identified obstacles faced currently and processed barriers involved in overcoming these obstacles. Pt identified steps necessary for overcoming these obstacles and explored motivation (internal and external) for facing these difficulties head on. Pt further identified one area of concern in their lives and chose a skill of focus pulled from their "toolbox." Mary Harper reported that her main goal was to "get out of this depression and stop feeling suicidal." Mary Harper reports that she often drinks to self medicate and to help her sleep. "I need to get my sleeping patterns under control. That's a big issue." She stated that she is working with the MD to get on "the right medication" and feels hopeful about being able to reach her goal of mood stability and avoiding alcohol relapse. Pt stated that she often journals when she experiences racing thoughts in order "to get the thoughts out of my head and off my mind." She continues to show progress in the group setting and improving insight. Mary Harper also provided emotional support for other pts and offered advice regarding ACT team services.    Smart, Mary Harper LCSWA  10/10/2014, 2:55 PM

## 2014-10-10 NOTE — Progress Notes (Signed)
D: Pt denies HI/AV but has passive SI but contracts for safety. Pt is pleasant and cooperative. Pt rates depression at a 8, anxiety at a 5, and Helplessness/hopelessness at a 7.  A: Pt was offered support and encouragement. Pt was given scheduled medications. Pt was encourage to attend groups. Q 15 minute checks were done for safety.  R:Pt attends groups and interacts well with peers and staff. Pt taking medication. Pt has no complaints.Pt receptive to treatment and safety maintained on unit.

## 2014-10-10 NOTE — Progress Notes (Signed)
Pt did not attend NA group this evening.  

## 2014-10-10 NOTE — [facility] Group Notes (Signed)
[facility] L[facility] Dis[facility] Planning Group Note   10/10/2014 9:31 AM  Participation Quality:  Appropriate   Mood/Affect:  Appropriate  Depression Rating:  8  Anxiety Rating:  5 "lower than it's been."   Thoughts of Suicide:  No Will you contract for safety?   NA  Current AVH:  No  Plan for Discharge/Comments:  Pt reports that she lives alone and plans to return home at d/c. She is set up with [name]. "I'm still having racing thoughts." Pt reports that she is scheduled to get a CAT scan today. Fair sleep.   Transportation Means: friend or family?   Supports: some family supports reported.   Smart, American FinancialHeather LCSWA

## 2014-10-10 NOTE — Progress Notes (Signed)
Recreation Therapy Notes  Date: 03.09.2016 Time: 9:30am Location: 300 Hall Group Room   Group Topic: Stress Management  Goal Area(s) Addresses:  Patient will actively participate in stress management techniques presented during session.   Behavioral Response: Engaged, Attentive, Appropriate   Intervention: Art, Aromatherapy, Music  Activity :  LRT played meditation music, placed medicine cups containing cotton balls and drops of essential oils around room and provided each patient with their choice of 1 of 5 mandala's. Patients were asked to complete as much of their mandala as possible during 30 minutes stress management group.   Education:  Stress Management, Discharge Planning.   Education Outcome: Acknowledges education  Clinical Observations/Feedback: Patient actively engaged in group session, completing significant portion of mandala. Patient reported she felt a reduction in her stress level as a result of interaction in group activity.  Mary Harper, LRT/CTRS  Travontae Freiberger L 10/10/2014 2:06 PM

## 2014-10-10 NOTE — Progress Notes (Signed)
[facility] MD Progress Note  10/10/2014 3:26 PM Mary Harper  MRN:  [id number] Subjective:  Mary Harper continues to endorse depression, and suicidal ruminations. Admits to "obsesive thoughts about suicide" She continues to describe an anergic state and a sense of hopelessness. Her CT Scan failed to show any positive findings. She states that she is having a hard time with sleep. When she wakes up middle of the night , she starts thinking about suicide cant go back to sleep. She would not try to hurt herself while here.  Principal Problem: Bipolar affective disorder, depressed, severe Diagnosis:   Patient Active Problem List   Diagnosis Date Noted  . Suicidal ideations [R45.851] 10/03/2014    Priority: High  . Bipolar affective disorder, depressed, severe [F31.4] 07/13/2014    Priority: High  . 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 Alcohol intoxication [F10.129]    Total Time spent with patient: 30 minutes   Past Medical History:  Past Medical History  Diagnosis Date  . Depression   . Bipolar 1 disorder   . Alcoholism   . PTSD (post-traumatic stress disorder)   . Carpal tunnel syndrome on both sides   . Gastric bypass status for obesity   . Anxiety    History reviewed. No pertinent past surgical history. Family History: History reviewed. No pertinent family history. Social History:  History  Alcohol Use  . Yes     History  Drug Use No    Comment: binge drink about every 3 weeks     History   Social History  . Marital Status: Single    Spouse Name: N/A  . Number of Children: N/A  . Years of Education: N/A   Social History Main Topics  . Smoking status: Never Smoker   . Smokeless tobacco: Never Used  . Alcohol Use: Yes  . Drug Use: No     Comment: binge drink about every 3 weeks   . Sexual Activity: Not on file   Other Topics Concern  . None   Social History Narrative   Additional History:    Sleep: Poor  Appetite:  Fair   Assessment:    Musculoskeletal: Strength & Muscle Tone: within normal limits Gait & Station: normal Patient leans: N/A   Psychiatric Specialty Exam: Physical Exam  Review of Systems  Constitutional: Negative.   HENT: Negative.   Eyes: Negative.   Respiratory: Negative.   Cardiovascular: Negative.   Gastrointestinal: Negative.   Genitourinary: Negative.   Musculoskeletal: Negative.   Skin: Negative.   Neurological: Negative.   Endo/Heme/Allergies: Negative.   Psychiatric/Behavioral: Positive for depression and suicidal ideas. The patient is nervous/anxious and has insomnia.     Blood pressure 100/60, pulse 76, temperature 98.8 F (37.1 C), temperature source Oral, resp. rate 18, height 5\' 3"  (1.6 m), weight 92.534 kg (204 lb), last menstrual period 09/09/2014.Body mass index is 36.15 kg/(m^2).  General Appearance: Fairly Groomed  Patent attorneyye Contact::  Fair  Speech:  Clear and Coherent  Volume:  Decreased  Mood:  Depressed  Affect:  Depressed and Restricted  Thought Process:  Coherent and Goal Directed  Orientation:  Full (Time, Place, and Person)  Thought Content:  symptoms events worries concerns  Suicidal Thoughts:  Yes.  without intent/plan  Homicidal Thoughts:  No  Memory:  Immediate;   Fair Recent;   Fair Remote;   Fair  Judgement:  Fair  Insight:  Present  Psychomotor Activity:  Decreased  Concentration:  Fair  Recall:  Mary Harper  Fund of  Knowledge:Fair  Language: Fair  Akathisia:  No  Handed:  Right  AIMS (if indicated):     Assets:  Desire for Improvement Housing  ADL's:  Intact  Cognition: WNL  Sleep:  Number of Hours: 0     Current Medications: Current Facility-Administered Medications  Medication Dose Route Frequency Provider Last Rate Last Dose  . acetaminophen (TYLENOL) tablet 650 mg  650 mg Oral Q6H PRN Kerry Hough, PA-C   650 mg at 10/05/14 0831  . alum & mag hydroxide-simeth (MAALOX/MYLANTA) 200-200-20 MG/5ML suspension 30 mL  30 mL Oral Q4H PRN Kerry Hough, PA-C      . ARIPiprazole (ABILIFY) tablet 5 mg  5 mg Oral Daily Rachael Fee, MD   5 mg at 10/10/14 0800  . buPROPion (WELLBUTRIN XL) 24 hr tablet 150 mg  150 mg Oral Daily Rachael Fee, MD   150 mg at 10/10/14 0800  . butalbital-acetaminophen-caffeine (FIORICET, ESGIC) 50-325-40 MG per tablet 2 tablet  2 tablet Oral Q6H PRN Rachael Fee, MD   2 tablet at 10/10/14 0444  . eszopiclone (LUNESTA) tablet 3 mg  3 mg Oral QHS Rachael Fee, MD      . FLUoxetine (PROZAC) capsule 20 mg  20 mg Oral Daily Kerry Hough, PA-C   20 mg at 10/10/14 0800  . gabapentin (NEURONTIN) capsule 300 mg  300 mg Oral TID Kerry Hough, PA-C   300 mg at 10/10/14 1203  . hydrOXYzine (ATARAX/VISTARIL) tablet 50 mg  50 mg Oral QHS PRN Kerry Hough, PA-C      . lithium citrate 300 MG/5ML solution 900 mg  900 mg Oral QHS Rachael Fee, MD   900 mg at 10/09/14 2027  . magnesium hydroxide (MILK OF MAGNESIA) suspension 30 mL  30 mL Oral Daily PRN Kerry Hough, PA-C      . prazosin (MINIPRESS) capsule 1 mg  1 mg Oral QHS Kerry Hough, PA-C   1 mg at 10/09/14 2027  . traZODone (DESYREL) tablet 150 mg  150 mg Oral QHS Rachael Fee, MD   150 mg at 10/09/14 2028  . Valproic Acid (DEPAKENE) 250 MG/5ML syrup SYRP 750 mg  750 mg Oral BID Kerry Hough, PA-C   750 mg at 10/10/14 0800    Lab Results: No results found for this or any previous visit (from the past 48 hour(s)).  Physical Findings: AIMS: Facial and Oral Movements Muscles of Facial Expression: None, normal Lips and Perioral Area: None, normal Jaw: None, normal Tongue: None, normal,Extremity Movements Upper (arms, wrists, hands, fingers): None, normal Lower (legs, knees, ankles, toes): None, normal, Trunk Movements Neck, shoulders, hips: None, normal, Overall Severity Severity of abnormal movements (highest score from questions above): None, normal Incapacitation due to abnormal movements: None, normal Patient's awareness of abnormal movements  (rate only patient's report): No Awareness, Dental Status Current problems with teeth and/or dentures?: No Does patient usually wear dentures?: No  CIWA:  CIWA-Ar Total: 1 COWS:  COWS Total Score: 2  Treatment Plan Summary: Daily contact with patient to assess and evaluate symptoms and progress in treatment and Medication management Supportive approach/coping skills/relapse prevention Bipolar Depression: will continue the trial with Wellbutrin XL 150 mg in AM                                  Will continue the Abilify 5 mg daily Insomnia:  will try Lunesta what she feels was effective for sleep in the past. If she was to continue to struggle with the insomnia will switch to Seroquel Will continue to work a relapse prevention plan Suicidal ideas: will continue to monitor and ask her to contract for safety Discuss CT Scan findings   Medical Decision Making:  Review of Psycho-Social Stressors (1), Review or order clinical lab tests (1), Review or order medicine tests (1) and Review of Medication Regimen & Side Effects (2)     Ninnie Fein A 10/10/2014, 3:26 PM

## 2014-10-11 NOTE — Progress Notes (Signed)
Patient did not attend the evening karaoke group.  

## 2014-10-11 NOTE — Progress Notes (Signed)
Pt reports she is doing better since she was able to get some sleep last night.  She says [facility], but then [place] until she feels more like staying by herself.  She says she is still having passive suicidal thoughts, but they are decreasing.  She denies HI/AV.  She voiced no other needs this evening.  Pt chose not to attend karaoke tonight, but to stay in her room and read.  Pt received her hs meds shortly after 2100, and is hopeful for another good night's sleep.  Pt makes her needs known to staff.  Support and encouragement offered.  Safety maintained with q15 minute checks.

## 2014-10-11 NOTE — Progress Notes (Signed)
Patient ID: Mary Harper, female   DOB: 07-30-1966, 49 y.o.   MRN: [id number] PER STATE REGULATIONS 482.30  THIS CHART WAS REVIEWED FOR MEDICAL NECESSITY WITH RESPECT TO THE PATIENT'S ADMISSION/ DURATION OF STAY.  NEXT REVIEW DATE: 10/15/2014  Willa RoughJENNIFER JONES Ahren Pettinger, RN, BSN CASE MANAGER

## 2014-10-11 NOTE — Progress Notes (Signed)
[facility] MD Progress Note  10/11/2014 3:20 PM Mary Harper  MRN:  [id number] Subjective:  Mary Harper states that she slept better last night. She states that just by being able to sleep she woke up feeling better. Still reports having the suicidal ruminations but states that they seem to be less intense States being able to sleep was a big improvement for her and hopes the medication keeps working. Reports no increased restlessness with the Wellbutrin.  Principal Problem: Bipolar affective disorder, depressed, severe Diagnosis:   Patient Active Problem List   Diagnosis Date Noted  . Suicidal ideations [R45.851] 10/03/2014    Priority: High  . Bipolar affective disorder, depressed, severe [F31.4] 07/13/2014    Priority: High  . 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 Alcohol intoxication [F10.129]    Total Time spent with patient: 30 minutes   Past Medical History:  Past Medical History  Diagnosis Date  . Depression   . Bipolar 1 disorder   . Alcoholism   . PTSD (post-traumatic stress disorder)   . Carpal tunnel syndrome on both sides   . Gastric bypass status for obesity   . Anxiety    History reviewed. No pertinent past surgical history. Family History: History reviewed. No pertinent family history. Social History:  History  Alcohol Use  . Yes     History  Drug Use No    Comment: binge drink about every 3 weeks     History   Social History  . Marital Status: Single    Spouse Name: N/A  . Number of Children: N/A  . Years of Education: N/A   Social History Main Topics  . Smoking status: Never Smoker   . Smokeless tobacco: Never Used  . Alcohol Use: Yes  . Drug Use: No     Comment: binge drink about every 3 weeks   . Sexual Activity: Not on file   Other Topics Concern  . None   Social History Narrative   Additional History:    Sleep: Fair  Appetite:  Fair   Assessment:   Musculoskeletal: Strength & Muscle Tone: within normal limits Gait &  Station: normal Patient leans: N/A   Psychiatric Specialty Exam: Physical Exam  Review of Systems  Constitutional: Negative.   HENT: Negative.   Eyes: Negative.   Respiratory: Negative.   Cardiovascular: Negative.   Gastrointestinal: Negative.   Genitourinary: Negative.   Musculoskeletal: Negative.   Skin: Negative.   Neurological: Negative.   Endo/Heme/Allergies: Negative.   Psychiatric/Behavioral: Positive for depression and suicidal ideas.    Blood pressure 103/59, pulse 81, temperature 98.1 F (36.7 C), temperature source Oral, resp. rate 18, height  (1.6 m), weight 92.534 kg (204 lb), last menstrual period 09/09/2014.Body mass index is 36.15 kg/(m^2).  General Appearance: Fairly Groomed  Patent attorney::  Fair  Speech:  Clear and Coherent  Volume:  Normal  Mood:  "better after i was able to sleep last night"  Affect:  Restricted  Thought Process:  Coherent and Goal Directed  Orientation:  Full (Time, Place, and Person)  Thought Content:  symptoms events worries concerns  Suicidal Thoughts:  Yes, but less intense  Homicidal Thoughts:  No  Memory:  Immediate;   Fair Recent;   Fair Remote;   Fair  Judgement:  Fair  Insight:  Present  Psychomotor Activity:  Decreased  Concentration:  Fair  Recall:  [name] of Knowledge:Fair  Language: Fair  Akathisia:  No  Handed:  Right  AIMS (if  indicated):     Assets:  Desire for Improvement Housing  ADL's:  Intact  Cognition: WNL  Sleep:  Number of Hours: 6.75     Current Medications: Current Facility-Administered Medications  Medication Dose Route Frequency Provider Last Rate Last Dose  . acetaminophen (TYLENOL) tablet 650 mg  650 mg Oral Q6H PRN Kerry Hough, PA-C   650 mg at 10/05/14 0831  . alum & mag hydroxide-simeth (MAALOX/MYLANTA) 200-200-20 MG/5ML suspension 30 mL  30 mL Oral Q4H PRN Kerry Hough, PA-C      . ARIPiprazole (ABILIFY) tablet 5 mg  5 mg Oral Daily Rachael Fee, MD   5 mg at 10/11/14  [phone]  . buPROPion (WELLBUTRIN XL) 24 hr tablet 150 mg  150 mg Oral Daily Rachael Fee, MD   150 mg at 10/11/14 5409  . butalbital-acetaminophen-caffeine (FIORICET, ESGIC) 50-325-40 MG per tablet 2 tablet  2 tablet Oral Q6H PRN Rachael Fee, MD   2 tablet at 10/10/14 0444  . eszopiclone (LUNESTA) tablet 3 mg  3 mg Oral QHS Rachael Fee, MD   3 mg at 10/10/14 2121  . FLUoxetine (PROZAC) capsule 20 mg  20 mg Oral Daily Kerry Hough, PA-C   20 mg at 10/11/14 0815  . gabapentin (NEURONTIN) capsule 300 mg  300 mg Oral TID Kerry Hough, PA-C   300 mg at 10/11/14 1204  . hydrOXYzine (ATARAX/VISTARIL) tablet 50 mg  50 mg Oral QHS PRN Kerry Hough, PA-C      . lithium citrate 300 MG/5ML solution 900 mg  900 mg Oral QHS Rachael Fee, MD   900 mg at 10/10/14 2121  . magnesium hydroxide (MILK OF MAGNESIA) suspension 30 mL  30 mL Oral Daily PRN Kerry Hough, PA-C      . prazosin (MINIPRESS) capsule 1 mg  1 mg Oral QHS Kerry Hough, PA-C   1 mg at 10/10/14 2121  . traZODone (DESYREL) tablet 150 mg  150 mg Oral QHS Rachael Fee, MD   150 mg at 10/10/14 2121  . Valproic Acid (DEPAKENE) 250 MG/5ML syrup SYRP 750 mg  750 mg Oral BID Kerry Hough, PA-C   750 mg at 10/11/14 8119    Lab Results: No results found for this or any previous visit (from the past 48 hour(s)).  Physical Findings: AIMS: Facial and Oral Movements Muscles of Facial Expression: None, normal Lips and Perioral Area: None, normal Jaw: None, normal Tongue: None, normal,Extremity Movements Upper (arms, wrists, hands, fingers): None, normal Lower (legs, knees, ankles, toes): None, normal, Trunk Movements Neck, shoulders, hips: None, normal, Overall Severity Severity of abnormal movements (highest score from questions above): None, normal Incapacitation due to abnormal movements: None, normal Patient's awareness of abnormal movements (rate only patient's report): No Awareness, Dental Status Current problems with teeth  and/or dentures?: No Does patient usually wear dentures?: No  CIWA:  CIWA-Ar Total: 1 COWS:  COWS Total Score: 2  Treatment Plan Summary: Daily contact with patient to assess and evaluate symptoms and progress in treatment and Medication management Supportive approach/coping skills Bipolar Depression: will continue to optimize response to the Depakote/Lithium/Prozac/Abilify/Wellbutrin combination Will try to simplify the medication regime if at all possible given the severity of the depression Insomnia: will continue to work with the [place] Will work with CBT/mindfulness Will encouraged behavioral activation (exercise, social interaction-involvement)    Medical Decision Making:  Review of Psycho-Social Stressors (1), Review of Medication Regimen & Side Effects (2)  and Review of New Medication or Change in Dosage (2)     Mary Harper A 10/11/2014, 3:20 PM

## 2014-10-11 NOTE — Plan of Care (Signed)
Problem: Diagnosis: Increased Risk For Suicide Attempt Goal: LTG-Patient Will Show Positive Response to Medication LTG (by discharge) : Patient will show positive response to medication and will participate in the development of the discharge plan.  Outcome: Completed/Met Date Met:  10/11/14 Nurse talked to patient about suicidal thoughts.

## 2014-10-11 NOTE — Progress Notes (Signed)
Pt reports she had a CT scan today, but the MD said it was normal.  Her main concern was that she get some sleep.  She said she has not been able to get a good night's sleep for several days.  She denies HI/AV, but says she has suicidal thoughts quite frequently.  She was initially irritable because writer was not readily available to give her meds shortly after 2100, but her mood lightened when Clinical research associatewriter engaged her in conversation.  Pt says she has had a fairly good day.  Meds were given.  Pt starts Lunesta tonight for sleep.  Support and encouragement offered.  Pt makes her needs known to staff.  Safety maintained with q15 minute checks.

## 2014-10-11 NOTE — [facility] Group Notes (Signed)
The focus of this group is to educate the patient on the purpose and policies of crisis stabilization and provide a format to answer questions about their admission.  The group details unit policies and expectations of patients while admitted.  Patient attended 0900 nurse education orientation group this morning.  Patient actively participated, appropriate affect, alert, appropriate insight and engagement.  Today patient will work on 3 goals for discharge.  Stated she plans to talk to ACT team and make sure she gets all services she needs and are available to her.

## 2014-10-11 NOTE — Progress Notes (Signed)
D:  Patient's self inventory sheet, patient slept good last night, sleep medication is helpful.  Good appetite, normal energy level, fair concentration.  Rated depression 5-6, hopeless 3, anxiety 2.  Denied withdrawals.  SI continues but has improved, contracts for safety.  Physical problems of dizziness, headaches.  Physical pain, headaches, worst pain #4, medication is helpful.  Goal is to plan for discharge, call ACT team, family, maybe plan a visit, call intergroup.  Plans to make calls, write a list of things to do at home.  Does have discharge plan at this time. A:  Medications administered per MD orders.  Emotional support and encouragement given patient. R:  Denied HI.  Denied A/V hallucinations.  Safety maintained with 15 minute checks. Patient stated she is feeling much better today.  Patient has been smiling and talking to peers/staff.  Stated she slept better last night.  Feels better about herself now.  When she talks to ACT team, plans to make sure she gets all services she needs, would like individual therapy.

## 2014-10-12 LAB — LIPID PANEL
CHOLESTEROL: 148 mg/dL (ref 0–200)
HDL: 55 mg/dL (ref >39)
LDL CALC: 77 mg/dL (ref 0–99)
Total CHOL/HDL Ratio: 2.7 RATIO
Triglycerides: 82 mg/dL (ref <150)
VLDL: 16 mg/dL (ref 0–40)

## 2014-10-12 LAB — TSH: TSH: 3.51 u[IU]/mL (ref 0.350–4.500)

## 2014-10-12 NOTE — Plan of Care (Signed)
Problem: Alteration in mood Goal: STG-Pt Able to Identify Plan For Continuing Care at D/C Pt. Will be able to identify a plan for continuing care at discharge  Outcome: Completed/Met Date Met:  10/12/14 Pt plans to go stay with relatives for a while in [place], [place] at discharge until she feels she can stay safely by herself.

## 2014-10-12 NOTE — [facility] Group Notes (Signed)
[facility] LCSW Group Therapy 10/12/2014 1:15 PM Type of Therapy: Group Therapy Participation Level: Active  Participation Quality: Attentive, Sharing and Supportive  Affect: Appropriate  Cognitive: Alert and Oriented  Insight: Developing/Improving and Engaged  Engagement in Therapy: Developing/Improving and Engaged  Modes of Intervention: Clarification, Confrontation, Discussion, Education, Exploration, Limit-setting, Orientation, Problem-solving, Rapport Building, Dance movement psychotherapisteality Testing, Socialization and Support  Summary of Progress/Problems: The topic for today was feelings about relapse. Pt discussed what relapse prevention is to them and identified triggers that they are on the path to relapse. Pt processed their feeling towards relapse and was able to relate to peers. Pt discussed coping skills that can be used for relapse prevention. Patient discussed the importance of her WRAP plan in her recovery and discussed a recent incident in which she was able to refrain from using. CSW and group members provided emotional support and encouragement.   Mary BruinKristin Bess Saltzman, MSW, [employer]CS[employer] Clinical Social Worker [facility] [phone]

## 2014-10-12 NOTE — Progress Notes (Signed)
D: Patient's affect appropriate to circumstance and mood is anxious. She reported on the self inventory sheet that she's sleeping fair at night, appetite and ability to concentrate are both good and energy level is normal. Patient rates depression "3", feelings of hopelessness "2" and anxiety "1". She's actively participating in groups and interactive with peers in the dayroom. Patient is compliant with medication regimen.  A: Support and encouragement provided to patient. Scheduled medications given per MD orders. Maintain Q15 minute checks for safety.  R: Patient receptive. Endorses passive SI, but contracts for safety. Denies HI. Patient remains safe on the hall.

## 2014-10-12 NOTE — [facility] Group Notes (Signed)
   [facility]  10/12/2014  8:45 AM   Participation Quality: Alert, Appropriate and Oriented  Mood/Affect: Appropriate  Depression Rating: 2  Anxiety Rating: 2  Thoughts of Suicide: Pt denies SI/HI  Will you contract for safety? Yes  Current AVH: Pt denies  Plan for Discharge/Comments: Pt attended discharge planning group and actively participated in group. CSW provided pt with today's workbook. Patient reports feeling "much better" today and reports improvement in her depression, anxiety, and SI thoughts. She is hopeful to discharge on Monday 3/14 to stay [place] family in [place] for [place] weeks before returning to [place] and resuming services with [name]. Patient plans to go to local mental health clinic as a walk in and [employer] meetings while in [place].   Transportation Means: Pt reports access to transportation  Supports: Patient identifies her family as supportive.  Samuella BruinKristin Rayola Everhart, MSW, [employer]CSWA Clinical Social Worker [facility] [phone]

## 2014-10-12 NOTE — Tx Team (Addendum)
Interdisciplinary Treatment Plan Update (Adult) Date: 10/12/2014   Time Reviewed: 9:30 AM  Progress in Treatment: Attending groups: Yes Participating in groups: Yes Taking medication as prescribed: Yes Tolerating medication: Yes Family/Significant other contact made: Yes, CSW has spoken to patient's guardian Arnetha MassyBob Heilig Patient understands diagnosis: Yes Discussing patient identified problems/goals with staff: Yes Medical problems stabilized or resolved: Yes Denies suicidal/homicidal ideation: No, endorses passive SI today Issues/concerns per patient self-inventory: Yes Other:  New problem(s) identified: N/A  Discharge Plan or Barriers:   3/4: Patient plans to return home and follow up with her [name] [name].  Patient continues to endorse SI at this time.  3/8: Patient plans to return home and follow up with her [name] [name].  Patient continues to remain tearful at times and endorses depressive symptoms and SI at this time. MD is going to order cat scan or MRI due to cognitive deficits and "black outs".   3/11: Patient hopeful to discharge on Monday 3/14 and plans to [place] weeks and will continue services with [name] [name].   Reason for Continuation of Hospitalization:  Depression Anxiety Medication Stabilization   Comments: N/A  Estimated length of stay: 3-5 days  For review of initial/current patient goals, please see plan of care. Patient is a 49 year old Caucasian female admitted with SI, depression, and anxiety. Patient lives alone in a boarding house. She has a guardian/payee- Ross StoresBob Heilig. She [name]. [facility]. Patient will benefit from crisis stabilization, medication evaluation, group therapy, and psycho education in addition to case management for discharge planning. Patient and CSW reviewed  pt's identified goals and treatment plan. Pt verbalized understanding and agreed to treatment plan.  Attendees: Patient:    Family:    Physician: Dr. Dub MikesLugo 10/12/2014 9:30 AM  Nursing: Lendell CapriceBrittany Guthrie, Maureen ChattersPatty Duke, Ronecia Byrd, [place] 10/12/2014 9:30 AM  Clinical Social Worker: Samuella BruinKristin Tamila Gaulin,  LCSWA 10/12/2014 9:30 AM  Other: Juline PatchQuylle Hodnett, LCSW 10/12/2014 9:30 AM  Other:  Leisa LenzValerie Enoch, [facility] Liaison  10/12/2014 9:30 AM  Other: Onnie BoerJennifer Clark, Case Manager 10/12/2014 9:30 AM  Other: Mosetta AnisAggie Nwoko, Laura Davis NP 10/12/2014 9:30 AM  Other:    Other:    Other:     Other:     Scribe for Treatment Team:  Samuella BruinKristin Chrisette Man, MSW, [employer]CSWA [phone]

## 2014-10-12 NOTE — Progress Notes (Signed)
[facility] MD Progress Note  10/12/2014 4:30 PM Mary Harper  MRN:  [id number] Subjective:  Mary Harper did not sleep as well last night. States she was so excited that she was able to sleep really well the night before so she is a little disappointed she did not fare as well last night. She is willing to give the Lunesta another try. She states the suicidal ideas keep decreasing in frequency and intensity. She states that her family has offered to take her with them to [facility] so she can stay with them for couple of weeks once she gets out of the hospital.  Principal Problem: Bipolar affective disorder, depressed, severe Diagnosis:   Patient Active Problem List   Diagnosis Date Noted  . Suicidal ideations [R45.851] 10/03/2014    Priority: High  . Bipolar affective disorder, depressed, severe [F31.4] 07/13/2014    Priority: High  . 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 Alcohol intoxication [F10.129]    Total Time spent with patient: 30 minutes   Past Medical History:  Past Medical History  Diagnosis Date  . Depression   . Bipolar 1 disorder   . Alcoholism   . PTSD (post-traumatic stress disorder)   . Carpal tunnel syndrome on both sides   . Gastric bypass status for obesity   . Anxiety    History reviewed. No pertinent past surgical history. Family History: History reviewed. No pertinent family history. Social History:  History  Alcohol Use  . Yes     History  Drug Use No    Comment: binge drink about every 3 weeks     History   Social History  . Marital Status: Single    Spouse Name: N/A  . Number of Children: N/A  . Years of Education: N/A   Social History Main Topics  . Smoking status: Never Smoker   . Smokeless tobacco: Never Used  . Alcohol Use: Yes  . Drug Use: No     Comment: binge drink about every 3 weeks   . Sexual Activity: Not on file   Other Topics Concern  . None   Social History Narrative   Additional History:    Sleep:  Poor  Appetite:  Fair   Assessment:   Musculoskeletal: Strength & Muscle Tone: within normal limits Gait & Station: normal Patient leans: N/A   Psychiatric Specialty Exam: Physical Exam  Review of Systems  Constitutional: Negative.   Eyes: Negative.   Respiratory: Negative.   Cardiovascular: Negative.   Gastrointestinal: Negative.   Genitourinary: Negative.   Musculoskeletal: Negative.   Skin: Negative.   Neurological: Positive for headaches.  Endo/Heme/Allergies: Negative.   Psychiatric/Behavioral: Positive for depression. The patient is nervous/anxious and has insomnia.     Blood pressure 96/62, pulse 66, temperature 97.8 F (36.6 C), temperature source Oral, resp. rate 20, height  (1.6 m), weight 92.534 kg (204 lb), last menstrual period 09/09/2014.Body mass index is 36.15 kg/(m^2).  General Appearance: Fairly Groomed  Patent attorney::  Fair  Speech:  Clear and Coherent  Volume:  Decreased  Mood:  Anxious, Depressed and worried  Affect:  Restricted  Thought Process:  Coherent and Goal Directed  Orientation:  Full (Time, Place, and Person)  Thought Content:  symptoms events worries concerns  Suicidal Thoughts:  less frequent less intense  Homicidal Thoughts:  No  Memory:  Immediate;   Fair Recent;   Fair Remote;   Fair  Judgement:  Fair  Insight:  Present  Psychomotor Activity:  Decreased  Concentration:  Fair  Recall:  [employer]  Fund of Knowledge:Fair  Language: Fair  Akathisia:  No  Handed:  Right  AIMS (if indicated):     Assets:  Desire for Improvement Housing  ADL's:  Intact  Cognition: WNL  Sleep:  Number of Hours: 4.75     Current Medications: Current Facility-Administered Medications  Medication Dose Route Frequency Provider Last Rate Last Dose  . acetaminophen (TYLENOL) tablet 650 mg  650 mg Oral Q6H PRN Kerry HoughSpencer E Simon, PA-C   650 mg at 10/05/14 0831  . alum & mag hydroxide-simeth (MAALOX/MYLANTA) 200-200-20 MG/5ML suspension 30 mL  30 mL  Oral Q4H PRN Kerry HoughSpencer E Simon, PA-C      . ARIPiprazole (ABILIFY) tablet 5 mg  5 mg Oral Daily Rachael FeeIrving A Thayer Inabinet, MD   5 mg at 10/12/14 0755  . buPROPion (WELLBUTRIN XL) 24 hr tablet 150 mg  150 mg Oral Daily Rachael FeeIrving A Curtis Cain, MD   150 mg at 10/12/14 0755  . butalbital-acetaminophen-caffeine (FIORICET, ESGIC) 50-325-40 MG per tablet 2 tablet  2 tablet Oral Q6H PRN Rachael FeeIrving A Deina Lipsey, MD   2 tablet at 10/12/14 1425  . eszopiclone (LUNESTA) tablet 3 mg  3 mg Oral QHS Rachael FeeIrving A Jovoni Borkenhagen, MD   3 mg at 10/11/14 2117  . FLUoxetine (PROZAC) capsule 20 mg  20 mg Oral Daily Kerry HoughSpencer E Simon, PA-C   20 mg at 10/12/14 0755  . gabapentin (NEURONTIN) capsule 300 mg  300 mg Oral TID Kerry HoughSpencer E Simon, PA-C   300 mg at 10/12/14 1200  . hydrOXYzine (ATARAX/VISTARIL) tablet 50 mg  50 mg Oral QHS PRN Kerry HoughSpencer E Simon, PA-C      . lithium citrate 300 MG/5ML solution 900 mg  900 mg Oral QHS Rachael FeeIrving A Damichael Hofman, MD   900 mg at 10/11/14 2117  . magnesium hydroxide (MILK OF MAGNESIA) suspension 30 mL  30 mL Oral Daily PRN Kerry HoughSpencer E Simon, PA-C      . prazosin (MINIPRESS) capsule 1 mg  1 mg Oral QHS Kerry HoughSpencer E Simon, PA-C   1 mg at 10/11/14 2117  . traZODone (DESYREL) tablet 150 mg  150 mg Oral QHS Rachael FeeIrving A Lavonte Palos, MD   150 mg at 10/11/14 2117  . Valproic Acid (DEPAKENE) 250 MG/5ML syrup SYRP 750 mg  750 mg Oral BID Kerry HoughSpencer E Simon, PA-C   750 mg at 10/12/14 [id number]    Lab Results: No results found for this or any previous visit (from the past 48 hour(s)).  Physical Findings: AIMS: Facial and Oral Movements Muscles of Facial Expression: None, normal Lips and Perioral Area: None, normal Jaw: None, normal Tongue: None, normal,Extremity Movements Upper (arms, wrists, hands, fingers): None, normal Lower (legs, knees, ankles, toes): None, normal, Trunk Movements Neck, shoulders, hips: None, normal, Overall Severity Severity of abnormal movements (highest score from questions above): None, normal Incapacitation due to abnormal movements: None,  normal Patient's awareness of abnormal movements (rate only patient's report): No Awareness, Dental Status Current problems with teeth and/or dentures?: No Does patient usually wear dentures?: No  CIWA:  CIWA-Ar Total: 1 COWS:  COWS Total Score: 2  Treatment Plan Summary: Daily contact with patient to assess and evaluate symptoms and progress in treatment and Medication management Supportive approach/coping skills/relapse prevention Bipolar Depression: will continue the Depakene/Lithium/Prozac/Wellbutrin Abilify combination Hypothyroidism: [name] did not mention that she was supposed to be on thyroid medication. She had ran out of it 6 weeks ago or longer. She states she just forgot about it. Will  go ahead and check the TSH and reassess. Low thyroid could contribute to the presentation Suicidal ideations: will continue to monitor seems they are getting better according to her self report  Insomnia; will try the Lunesta 3 mg at least one more time  Medical Decision Making:  Review of Psycho-Social Stressors (1), Review or order clinical lab tests (1), Review of Medication Regimen & Side Effects (2) and Review of New Medication or Change in Dosage (2)     Aliayah Tyer A 10/12/2014, 4:30 PM

## 2014-10-13 NOTE — [facility] Group Notes (Signed)
[facility] Group Notes:  (Clinical Social Work)  10/13/2014     10-11AM  Summary of Progress/Problems:   The main focus of today's process group was to learn how to use a decisional balance exercise to move forward in the Stages of Change, which were described and discussed.  Motivational Interviewing and a worksheet were utilized to help patients explore in depth the perceived benefits and costs of a self-sabotaging behavior, as well as the  benefits and costs of replacing that with a healthy coping mechanism.   The patient expressed that she uses isolation as an unhealthy coping skill.  She contributed to the group discussion well, and expressed that even though she went through same "class" last week, is hearing it differently as a result of feeling so much better now.  Type of Therapy:  Group Therapy - Process   Participation Level:  Active  Participation Quality:  Attentive, Sharing and Supportive  Affect:  Blunted  Cognitive:  Alert and Appropriate  Insight:  Engaged  Engagement in Therapy:  Engaged  Modes of Intervention:  Education, Motivational Interviewing  Mary MantleMareida Grossman-Orr, LCSW 10/13/2014, 12:29 PM

## 2014-10-13 NOTE — Progress Notes (Signed)
D: Patient more cheerful and interactive. Seen socializing with peers. Denies pain, SI, AH/VH. Rated depression 4/10 and decreased racing thoughts. Patient made no new complaint. Minipress at bed time with held due to low blood pressure 97/53. Getorade offered. Other due medications given as ordered.  A: Support and encouragement offered to patient. Patient encouraged to continue with the treatment plan and encouraged to verbalize needs to staff. Every 15 minutes check for safety maintained. Will continue to monitor patient.  R: Patient remains appropriate and safe.

## 2014-10-13 NOTE — Progress Notes (Signed)
Patient pleasant during the shift. Made no new complaint. Denies pain, SI, AH/VH at this time. Accepted all her due medications. No distress noted. Patient encouraged to continue with the with the treatment plan. Every 15 minutes check for safety maintained. Will continue to monitor and observe patient.

## 2014-10-13 NOTE — Progress Notes (Signed)
[facility] MD Progress Note  10/13/2014 4:37 PM Mary Harper  MRN:  [id number] Subjective:  [name] did sleep again last night with the [facility] after not being able to sleep the night before. She states she has been upset due to the behavior  of another patient but states that she has been dealing with this without letting this affect her progress. She states that the suicidal ideas are hardly there and that she is feeling more hopeful   Principal Problem: Bipolar affective disorder, depressed, severe Diagnosis:   Patient Active Problem List   Diagnosis Date Noted  . Suicidal ideations [R45.851] 10/03/2014    Priority: High  . Bipolar affective disorder, depressed, severe [F31.4] 07/13/2014    Priority: High  . 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 Alcohol intoxication [F10.129]    Total Time spent with patient: 30 minutes   Past Medical History:  Past Medical History  Diagnosis Date  . Depression   . Bipolar 1 disorder   . Alcoholism   . PTSD (post-traumatic stress disorder)   . Carpal tunnel syndrome on both sides   . Gastric bypass status for obesity   . Anxiety    History reviewed. No pertinent past surgical history. Family History: History reviewed. No pertinent family history. Social History:  History  Alcohol Use  . Yes     History  Drug Use No    Comment: binge drink about every 3 weeks     History   Social History  . Marital Status: Single    Spouse Name: N/A  . Number of Children: N/A  . Years of Education: N/A   Social History Main Topics  . Smoking status: Never Smoker   . Smokeless tobacco: Never Used  . Alcohol Use: Yes  . Drug Use: No     Comment: binge drink about every 3 weeks   . Sexual Activity: Not on file   Other Topics Concern  . None   Social History Narrative   Additional History:    Sleep: Fair  Appetite:  Fair   Assessment:   Musculoskeletal: Strength & Muscle Tone: within normal limits Gait & Station:  normal Patient leans: N/A   Psychiatric Specialty Exam: Physical Exam  Review of Systems  Constitutional: Negative.   HENT: Negative.   Eyes: Negative.   Respiratory: Negative.   Cardiovascular: Negative.   Gastrointestinal: Negative.   Genitourinary: Negative.   Musculoskeletal: Negative.   Skin: Negative.   Neurological: Negative.   Endo/Heme/Allergies: Negative.   Psychiatric/Behavioral: Positive for depression and suicidal ideas. The patient is nervous/anxious.     Blood pressure 99/64, pulse 80, temperature 98 F (36.7 C), temperature source Oral, resp. rate 18, height  (1.6 m), weight 92.534 kg (204 lb), last menstrual period 09/09/2014.Body mass index is 36.15 kg/(m^2).  General Appearance: Fairly Groomed  Patent attorney::  Fair  Speech:  Clear and Coherent  Volume:  Normal  Mood:  starting to feel better  Affect:  Appropriate  Thought Process:  Coherent and Goal Directed  Orientation:  Full (Time, Place, and Person)  Thought Content:  events symptoms worries concerns  Suicidal Thoughts:  Decrease in frequency and intensity  Homicidal Thoughts:  No  Memory:  Immediate;   Fair Recent;   Fair Remote;   Fair  Judgement:  Fair  Insight:  Present  Psychomotor Activity:  Restlessness  Concentration:  Fair  Recall:  [name] of Knowledge:Fair  Language: Fair  Akathisia:  No  Handed:  Right  AIMS (if indicated):     Assets:  Desire for Improvement Housing Social Support  ADL's:  Intact  Cognition: WNL  Sleep:  Number of Hours: 4.75     Current Medications: Current Facility-Administered Medications  Medication Dose Route Frequency Provider Last Rate Last Dose  . acetaminophen (TYLENOL) tablet 650 mg  650 mg Oral Q6H PRN Kerry HoughSpencer E Simon, PA-C   650 mg at 10/05/14 0831  . alum & mag hydroxide-simeth (MAALOX/MYLANTA) 200-200-20 MG/5ML suspension 30 mL  30 mL Oral Q4H PRN Kerry HoughSpencer E Simon, PA-C      . ARIPiprazole (ABILIFY) tablet 5 mg  5 mg Oral Daily Rachael FeeIrving  A Kaitlyn Skowron, MD   5 mg at 10/13/14 0757  . buPROPion (WELLBUTRIN XL) 24 hr tablet 150 mg  150 mg Oral Daily Rachael FeeIrving A Avyan Livesay, MD   150 mg at 10/13/14 0757  . butalbital-acetaminophen-caffeine (FIORICET, ESGIC) 50-325-40 MG per tablet 2 tablet  2 tablet Oral Q6H PRN Rachael FeeIrving A Demoni Gergen, MD   2 tablet at 10/12/14 1425  . eszopiclone (LUNESTA) tablet 3 mg  3 mg Oral QHS Rachael FeeIrving A Chanique Duca, MD   3 mg at 10/12/14 2110  . FLUoxetine (PROZAC) capsule 20 mg  20 mg Oral Daily Kerry HoughSpencer E Simon, PA-C   20 mg at 10/13/14 0757  . gabapentin (NEURONTIN) capsule 300 mg  300 mg Oral TID Kerry HoughSpencer E Simon, PA-C   300 mg at 10/13/14 1135  . hydrOXYzine (ATARAX/VISTARIL) tablet 50 mg  50 mg Oral QHS PRN Kerry HoughSpencer E Simon, PA-C      . lithium citrate 300 MG/5ML solution 900 mg  900 mg Oral QHS Rachael FeeIrving A Shaneil Yazdi, MD   900 mg at 10/12/14 2110  . magnesium hydroxide (MILK OF MAGNESIA) suspension 30 mL  30 mL Oral Daily PRN Kerry HoughSpencer E Simon, PA-C      . prazosin (MINIPRESS) capsule 1 mg  1 mg Oral QHS Kerry HoughSpencer E Simon, PA-C   1 mg at 10/12/14 2109  . traZODone (DESYREL) tablet 150 mg  150 mg Oral QHS Rachael FeeIrving A Kimon Loewen, MD   150 mg at 10/12/14 2109  . Valproic Acid (DEPAKENE) 250 MG/5ML syrup SYRP 750 mg  750 mg Oral BID Kerry HoughSpencer E Simon, PA-C   750 mg at 10/13/14 [id number]    Lab Results:  Results for orders placed or performed during the hospital encounter of 10/03/14 (from the past 48 hour(s))  Lipid panel     Status: None   Collection Time: 10/12/14  7:57 PM  Result Value Ref Range   Cholesterol 148 0 - 200 mg/dL   Triglycerides 82 <960<150 mg/dL   HDL 55 >45>39 mg/dL   Total CHOL/HDL Ratio 2.7 RATIO   VLDL 16 0 - 40 mg/dL   LDL Cholesterol 77 0 - 99 mg/dL    Comment:        Total Cholesterol/HDL:CHD Risk Coronary Heart Disease Risk Table                     Men   Women  1/2 Average Risk   3.4   3.3  Average Risk       5.0   4.4  2 X Average Risk   9.6   7.1  3 X Average Risk  23.4   11.0        Use the calculated Patient Ratio above and the CHD  Risk Table to determine the patient's CHD Risk.        ATP III  CLASSIFICATION (LDL):  <100     mg/dL   Optimal  161-096  mg/dL   Near or Above                    Optimal  130-159  mg/dL   Borderline  045-409  mg/dL   High  >811     mg/dL   Very High Performed at [facility]   TSH     Status: None   Collection Time: 10/12/14  7:58 PM  Result Value Ref Range   TSH 3.510 0.350 - 4.500 uIU/mL    Comment: Performed at [facility]    Physical Findings: AIMS: Facial and Oral Movements Muscles of Facial Expression: None, normal Lips and Perioral Area: None, normal Jaw: None, normal Tongue: None, normal,Extremity Movements Upper (arms, wrists, hands, fingers): None, normal Lower (legs, knees, ankles, toes): None, normal, Trunk Movements Neck, shoulders, hips: None, normal, Overall Severity Severity of abnormal movements (highest score from questions above): None, normal Incapacitation due to abnormal movements: None, normal Patient's awareness of abnormal movements (rate only patient's report): No Awareness, Dental Status Current problems with teeth and/or dentures?: No Does patient usually wear dentures?: No  CIWA:  CIWA-Ar Total: 1 COWS:  COWS Total Score: 2  Treatment Plan Summary: Daily contact with patient to assess and evaluate symptoms and progress in treatment and Medication management Bipolar Depression; continue the Lithium/Depakene/Abilify/Prozac/Wellbutrin regime Insomnia; continue Lunesta 3 mg HS Hypothyroidism ( by history) the TSH test was WNL will defer to PCP to decide if and when to re start the thyroid medication Suicidal Ideas: continue to monitor and continue to work on ways to deal with them if the frequency and the intensity was to increase again  Medical Decision Making:  Review of Psycho-Social Stressors (1) discuss results of tests with patient, review medications and side effects     Shley Dolby A 10/13/2014, 4:37 PM

## 2014-10-13 NOTE — Progress Notes (Signed)
Psychoeducational Group Note  Date:  10/13/2014 Time: 1015  Group Topic/Focus:  Identifying Needs:   The focus of this group is to help patients identify their personal needs that have been historically problematic and identify healthy behaviors to address their needs.  Participation Level:  Minimal  Participation Quality:  Resistant  Affect:  Flat  Cognitive:  Confused  Insight:  Limited  Engagement in Group:  Limited  Additional Comments:    10/13/2014,4:24 PM Christina Gintz, Joie BimlerPatricia Lynn

## 2014-10-13 NOTE — Progress Notes (Signed)
D) Pt rates her depression at a 2, her hopelessness and anxiety at a 4. Denies SI and HI. Has been attending the program and is working on her Saturday booklet.  Continues to have some racing thoughts and states  she is having memory loss, but did remember this Clinical research associatewriter from last week. Compared from last week, Pt appears calmer and less stressed than last week. A) Given support, reassurance and praise, along with encouragement. Provided a 1:1.  R) Pt denies SI and HI. Attending the program.

## 2014-10-13 NOTE — Progress Notes (Signed)
.  Psychoeducational Group Note    Date: 10/13/2014 Time:  0930    Goal Setting Purpose of Group: To be able to set [name] that is measurable and that can be accomplished in one day Participation Level:  Active  Participation Quality:  Appropriate  Affect:  Appropriate  Cognitive:  Oriented  Insight:  Improving  Engagement in Group:  Engaged  Additional Comments:  Pt attending the group and participating appropriately  Mary Harper 

## 2014-10-14 MED ORDER — ARIPIPRAZOLE 5 MG PO TABS: 5.0000 mg | ORAL_TABLET | Freq: Every day | ORAL | Status: DC

## 2014-10-14 MED ORDER — VALPROIC ACID 250 MG/5ML PO SYRP: 750.0000 mg | ORAL_SOLUTION | Freq: Two times a day (BID) | ORAL | Status: DC

## 2014-10-14 MED ORDER — ACETAMINOPHEN 325 MG PO TABS: 650.0000 mg | ORAL_TABLET | Freq: Four times a day (QID) | ORAL | Status: DC | PRN

## 2014-10-14 MED ORDER — TRAZODONE HCL 150 MG PO TABS: 150.0000 mg | ORAL_TABLET | Freq: Every day | ORAL | Status: DC

## 2014-10-14 MED ORDER — FLUOXETINE HCL 20 MG PO CAPS: 20.0000 mg | ORAL_CAPSULE | Freq: Every day | ORAL | Status: DC

## 2014-10-14 MED ORDER — GABAPENTIN 300 MG PO CAPS: 300.0000 mg | ORAL_CAPSULE | Freq: Three times a day (TID) | ORAL | Status: DC

## 2014-10-14 MED ORDER — PRAZOSIN HCL 1 MG PO CAPS: 1.0000 mg | ORAL_CAPSULE | Freq: Every day | ORAL | Status: DC

## 2014-10-14 MED ORDER — LITHIUM CITRATE 300 MG/5 ML PO SYRP: 900.0000 mg | Freq: Every day | ORAL | Status: DC

## 2014-10-14 MED ORDER — ESZOPICLONE 3 MG PO TABS: 3.0000 mg | ORAL_TABLET | Freq: Every day | ORAL | Status: DC

## 2014-10-14 MED ORDER — HYDROXYZINE HCL 50 MG PO TABS: 50.0000 mg | ORAL_TABLET | Freq: Every evening | ORAL | Status: DC | PRN

## 2014-10-14 MED ORDER — BUPROPION HCL ER (XL) 150 MG PO TB24: 150.0000 mg | ORAL_TABLET | Freq: Every day | ORAL | Status: DC

## 2014-10-14 NOTE — Progress Notes (Signed)
Patient did attend the evening speaker AA meeting. Pt was engaged and appropriate during the meeting.   

## 2014-10-14 NOTE — Progress Notes (Signed)
D: Patient stated "I'm going home tomorrow. I am excited. No more suicidal thoughts, no more anxiety, no bad feelings".  A: Education offered on importance of medication compliance. Safety maintained at all times. Encouragement offered. Will continue      to monitor. R: Patient receptive to education offered.

## 2014-10-14 NOTE — Progress Notes (Signed)
[facility] Group Notes:  (Nursing/MHT/Case Management/Adjunct)  Date:  10/14/2014  Time:  12:28 AM  Type of Therapy:  Psychoeducational Skills  Participation Level:  Active  Participation Quality:  Appropriate  Affect:  Appropriate  Cognitive:  Appropriate  Insight:  Good  Engagement in Group:  Engaged  Modes of Intervention:  Education  Summary of Progress/Problems: Patient verbalized in group that she had a good day since she felt calmer. The patient also mentioned that she enjoyed the groups, especially the one which was facilitated by the nurses Thayer Ohm(Chris and CastlePatty). As a theme for the day, her support system will be her family for which she states that she is going to [place] once she is discharged and that the ACT Team will be providing her with services.   Luisfelipe Engelstad S 10/14/2014, 12:28 AM

## 2014-10-14 NOTE — Progress Notes (Signed)
D) Pt has attended the groups and is interacting appropriately with her peers. Affect is brighter and mood less depressed. Pt's affect is more spontaneous. Pt denies SI and HI and rates her depression, hopelessness and anxiety all at a 1. Has contributed to discussions within the milieu and has shown patience with other Pt's when she did not want to. States that she is feeling better overall and feels ready to go home A) Pt given support, reassurance and praise along with encouragement. Provided with a 1:1.  R) Pt states she feels ready to be discharged tomorrow.

## 2014-10-14 NOTE — [facility] Group Notes (Signed)
[facility] Group Notes:  (Clinical Social Work)  10/14/2014  10:00-11:00AM  Summary of Progress/Problems:   The main focus of today's process group was to   1)  discuss the importance of adding supports  2)  define health supports versus unhealthy supports  3)  identify the patient's current unhealthy supports and plan how to handle them  4)  Identify the patient's current healthy supports and plan what to add.  An emphasis was placed on using counselor, doctor, therapy groups, 12-step groups, and problem-specific support groups to expand supports.    The patient expressed full comprehension of the concepts presented, and agreed that there is a need to add more supports.  The patient stated her sister and ex-husband are concerned about her, and she will be going to stay with ex in [place] at discharge, will spend daytimes while he is at work with her sister.  Feels this is a good situation, is excited at the improvement in relationship with sister.  Is requesting D/C early AM tomorrow.  Type of Therapy:  Process Group with Motivational Interviewing  Participation Level:  Active  Participation Quality:  Attentive, Sharing and Supportive  Affect:  Appropriate  Cognitive:  Alert, Appropriate and Oriented  Insight:  Engaged  Engagement in Therapy:  Engaged  Modes of Intervention:   Education, Support and Processing, Activity  Pilgrim's PrideMareida Grossman-Orr, LCSW 10/14/2014, 12:15pm

## 2014-10-14 NOTE — Progress Notes (Signed)
Psychoeducational Group Note  Date: 10/14/2014 Time: 1315  Group Topic/Focus:  Making Healthy Choices:   The focus of this group is to help patients identify negative/unhealthy choices they were using prior to admission and identify positive/healthier coping strategies to replace them upon discharge.  Participation Level:  Active  Participation Quality:  Appropriate  Affect:  Anxious  Cognitive:  Appropriate  Insight:  Engaged  Engagement in Group:    Additional Comments:    10/14/2014,4:59 PM Patriciann Becht, Joie BimlerPatricia Lynn

## 2014-10-14 NOTE — Progress Notes (Signed)
[facility]  10/14/2014 2:22 PM Mary Harper  MRN:  [id number] Subjective:  Mary Harper states that she is doing much better. She has been able to sleep two nights in a row. Her depression is starting to get better and she endorses that she has no suicidal ideations. She is looking forward to spending time with her family. They are going to pick her up and take her with them to [place]. States they want to be there for her specially because she is taking new medications and this is the first time she is living by herself. She is also looking forward to seeing her adoptive sons who are in that area. She admits that the main reason she is here and not there is because of the psychiatric services.  Principal Problem: Bipolar affective disorder, depressed, severe Diagnosis:   Patient Active Problem List   Diagnosis Date Noted  . Suicidal ideations [R45.851] 10/03/2014    Priority: High  . Bipolar affective disorder, depressed, severe [F31.4] 07/13/2014    Priority: High  . 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 Alcohol intoxication [F10.129]    Total Time spent with patient: 30 minutes   Past Medical History:  Past Medical History  Diagnosis Date  . Depression   . Bipolar 1 disorder   . Alcoholism   . PTSD (post-traumatic stress disorder)   . Carpal tunnel syndrome on both sides   . Gastric bypass status for obesity   . Anxiety    History reviewed. No pertinent past surgical history. Family History: History reviewed. No pertinent family history. Social History:  History  Alcohol Use  . Yes     History  Drug Use No    Comment: binge drink about every 3 weeks     History   Social History  . Marital Status: Single    Spouse Name: N/A  . Number of Children: N/A  . Years of Education: N/A   Social History Main Topics  . Smoking status: Never Smoker   . Smokeless tobacco: Never Used  . Alcohol Use: Yes  . Drug Use: No     Comment: binge drink about every 3  weeks   . Sexual Activity: Not on file   Other Topics Concern  . None   Social History Narrative   Additional History:    Sleep: Fair  Appetite:  Fair   Assessment:   Musculoskeletal: Strength & Muscle Tone: within normal limits Gait & Station: normal Patient leans: N/A   Psychiatric Specialty Exam: Physical Exam  Review of Systems  Constitutional: Negative.   HENT: Negative.   Eyes: Negative.   Respiratory: Negative.   Cardiovascular: Negative.   Gastrointestinal: Negative.   Genitourinary: Negative.   Musculoskeletal: Negative.   Skin: Negative.   Neurological: Negative.   Endo/Heme/Allergies: Negative.   Psychiatric/Behavioral: Positive for depression. The patient is nervous/anxious.     Blood pressure 90/63, pulse 70, temperature 97.7 F (36.5 C), temperature source Oral, resp. rate 18, height 5\' 3"  (1.6 m), weight 92.534 kg (204 lb), last menstrual period 09/09/2014.Body mass index is 36.15 kg/(m^2).  General Appearance: Fairly Groomed  Patent attorneyye Contact::  Fair  Speech:  Clear and Coherent  Volume:  Normal  Mood:  Anxious  Affect:  Appropriate  Thought Process:  Coherent and Goal Directed  Orientation:  Full (Time, Place, and Person)  Thought Content:  symptoms response to the medications plans as she moves on  Suicidal Thoughts:  No  Homicidal Thoughts:  No  Memory:  Immediate;   Fair Recent;   Fair Remote;   Fair  Judgement:  Fair  Insight:  Present  Psychomotor Activity:  Normal  Concentration:  Fair  Recall:  [name] of Knowledge:Fair  Language: Fair  Akathisia:  No  Handed:  Right  AIMS (if indicated):     Assets:  Desire for Improvement Social Support  ADL's:  Intact  Cognition: WNL  Sleep:  Number of Hours: 5.25     Current Medications: Current Facility-Administered Medications  Medication Dose Route Frequency Provider Last Rate Last Dose  . acetaminophen (TYLENOL) tablet 650 mg  650 mg Oral Q6H PRN Kerry Hough, PA-C   650 mg  at 10/05/14 0831  . alum & mag hydroxide-simeth (MAALOX/MYLANTA) 200-200-20 MG/5ML suspension 30 mL  30 mL Oral Q4H PRN Kerry Hough, PA-C      . ARIPiprazole (ABILIFY) tablet 5 mg  5 mg Oral Daily Rachael Fee, MD   5 mg at 10/14/14 0839  . buPROPion (WELLBUTRIN XL) 24 hr tablet 150 mg  150 mg Oral Daily Rachael Fee, MD   150 mg at 10/14/14 1610  . butalbital-acetaminophen-caffeine (FIORICET, ESGIC) 50-325-40 MG per tablet 2 tablet  2 tablet Oral Q6H PRN Rachael Fee, MD   2 tablet at 10/12/14 1425  . eszopiclone (LUNESTA) tablet 3 mg  3 mg Oral QHS Rachael Fee, MD   3 mg at 10/13/14 2109  . FLUoxetine (PROZAC) capsule 20 mg  20 mg Oral Daily Kerry Hough, PA-C   20 mg at 10/14/14 9604  . gabapentin (NEURONTIN) capsule 300 mg  300 mg Oral TID Kerry Hough, PA-C   300 mg at 10/14/14 1134  . hydrOXYzine (ATARAX/VISTARIL) tablet 50 mg  50 mg Oral QHS PRN Kerry Hough, PA-C      . lithium citrate 300 MG/5ML solution 900 mg  900 mg Oral QHS Rachael Fee, MD   900 mg at 10/13/14 2109  . magnesium hydroxide (MILK OF MAGNESIA) suspension 30 mL  30 mL Oral Daily PRN Kerry Hough, PA-C      . prazosin (MINIPRESS) capsule 1 mg  1 mg Oral QHS Kerry Hough, PA-C   1 mg at 10/12/14 2109  . traZODone (DESYREL) tablet 150 mg  150 mg Oral QHS Rachael Fee, MD   150 mg at 10/13/14 2109  . Valproic Acid (DEPAKENE) 250 MG/5ML syrup SYRP 750 mg  750 mg Oral BID Kerry Hough, PA-C   750 mg at 10/14/14 5409    Lab Results:  Results for orders placed or performed during the hospital encounter of 10/03/14 (from the past 48 hour(s))  Lipid panel     Status: None   Collection Time: 10/12/14  7:57 PM  Result Value Ref Range   Cholesterol 148 0 - 200 mg/dL   Triglycerides 82 <811 mg/dL   HDL 55 >91 mg/dL   Total CHOL/HDL Ratio 2.7 RATIO   VLDL 16 0 - 40 mg/dL   LDL Cholesterol 77 0 - 99 mg/dL    Comment:        Total Cholesterol/HDL:CHD Risk Coronary Heart Disease Risk Table                      Men   Women  1/2 Average Risk   3.4   3.3  Average Risk       5.0   4.4  2 X Average Risk  9.6   7.1  3 X Average Risk  23.4   11.0        Use the calculated Patient Ratio above and the CHD Risk Table to determine the patient's CHD Risk.        ATP III CLASSIFICATION (LDL):  <100     mg/dL   Optimal  413-244  mg/dL   Near or Above                    Optimal  130-159  mg/dL   Borderline  010-272  mg/dL   High  >536     mg/dL   Very High Performed at [facility]   TSH     Status: None   Collection Time: 10/12/14  7:58 PM  Result Value Ref Range   TSH 3.510 0.350 - 4.500 uIU/mL    Comment: Performed at [facility]    Physical Findings: AIMS: Facial and Oral Movements Muscles of Facial Expression: None, normal Lips and Perioral Area: None, normal Jaw: None, normal Tongue: None, normal,Extremity Movements Upper (arms, wrists, hands, fingers): None, normal Lower (legs, knees, ankles, toes): None, normal, Trunk Movements Neck, shoulders, hips: None, normal, Overall Severity Severity of abnormal movements (highest score from questions above): None, normal Incapacitation due to abnormal movements: None, normal Patient's awareness of abnormal movements (rate only patient's report): No Awareness, Dental Status Current problems with teeth and/or dentures?: No Does patient usually wear dentures?: No  CIWA:  CIWA-Ar Total: 1 COWS:  COWS Total Score: 2  Treatment Plan Summary: Daily contact with patient to assess and evaluate symptoms and progress in treatment and Medication management Bipolar Depression: continue the Depakene/Lithium/Abilify/Prozac/Wellbutrin combination Insomnia: continue the Lunesta 3 mg Will continue to work a relapse prevention plan as well as working on life style changes that would help her better manage her mood disorder. Reassess for possible D/C tommorrow  Medical Decision Making:  Review of Psycho-Social Stressors  (1), Review of Medication Regimen & Side Effects (2) and Review of New Medication or Change in Dosage (2)     Jasan Doughtie A 10/14/2014, 2:22 PM

## 2014-10-14 NOTE — Discharge Summary (Signed)
Physician Discharge Summary Note  Patient:  Mary Harper is an 49 y.o., female MRN:  [id number] DOB:  April 24, 1966 Patient phone:  [phone] (home)  Patient address:   [address] [place] [address],  Total Time spent with patient: Greater than 30 minutes  Date of Admission:  10/03/2014  Date of Discharge: 10-14-14  Reason for Admission:  Mood stabilization treatment  Principal Problem: Bipolar affective disorder, depressed, severe Discharge Diagnoses: Patient Active Problem List   Diagnosis Date Noted  . Suicidal ideations [R45.851] 10/03/2014  . 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 Bipolar affective disorder, depressed, severe [F31.4] 07/13/2014  . Alcohol intoxication [F10.129]    Musculoskeletal: Strength & Muscle Tone: within normal limits Gait & Station: normal Patient leans: N/A  Psychiatric Specialty Exam: Physical Exam  Psychiatric: Her speech is normal and behavior is normal. Judgment and thought content normal. Her mood appears not anxious. Her affect is not angry, not blunt, not labile and not inappropriate. Cognition and memory are normal. She does not exhibit a depressed mood.    Review of Systems  Constitutional: Negative.   HENT: Negative.   Eyes: Negative.   Respiratory: Negative.   Cardiovascular: Negative.   Gastrointestinal: Negative.   Genitourinary: Negative.   Musculoskeletal: Negative.   Skin: Negative.   Neurological: Negative.   Endo/Heme/Allergies: Negative.   Psychiatric/Behavioral: Positive for depression (Stable) and substance abuse (Hx. alcohol dependence). Negative for suicidal ideas, hallucinations and memory loss. The patient has insomnia (Stable). The patient is not nervous/anxious.     Blood pressure 90/63, pulse 70, temperature 97.7 F (36.5 C), temperature source Oral, resp. rate 18, height  (1.6 m), weight 92.534 kg (204 lb), last menstrual period 09/09/2014.Body mass index is 36.15 kg/(m^2).  See  Md's SRA   Past Medical History:  Past Medical History  Diagnosis Date  . Depression   . Bipolar 1 disorder   . Alcoholism   . PTSD (post-traumatic stress disorder)   . Carpal tunnel syndrome on both sides   . Gastric bypass status for obesity   . Anxiety    History reviewed. No pertinent past surgical history. Family History: History reviewed. No pertinent family history. Social History:  History  Alcohol Use  . Yes     History  Drug Use No    Comment: binge drink about every 3 weeks     History   Social History  . Marital Status: Single    Spouse Name: N/A  . Number of Children: N/A  . Years of Education: N/A   Social History Main Topics  . Smoking status: Never Smoker   . Smokeless tobacco: Never Used  . Alcohol Use: Yes  . Drug Use: No     Comment: binge drink about every 3 weeks   . Sexual Activity: Not on file   Other Topics Concern  . None   Social History Narrative   Risk to Self: Is patient at risk for suicide?: Yes What has been your use of drugs/alcohol within the last 12 months?: sportatic binge drinking on 2 bottles of wine Risk to Others: No Prior Inpatient Therapy: Yes Prior Outpatient Therapy: Yes  Level of Care:  OP  Hospital Course:  Mary Harper is an 49 y.o. female with history of depression, Bipolar I Disorder, and Alcoholism. Pt sts that she is having SI, anxiety and depression. Onset of symptoms started 2 weeks ago. Patient reports suicidal thoughts but does not specify any plan, "I've had thoughts to do all  kind of things but nothing specific" Patient has a history of 25+ suicide attempts and some were lethal attempts putting her in ICU. She is unable to identify any emotional triggers in the last 2 weeks. She is worried foremost about the lapses in memory. Patient reports that her suicidal thoughts have become so intense that she is now having racing thoughts. Patient increasingly anxious and reports occasional panic attacks. Pt sts that  she has had blackout while sober, bad memory especially over the last 2-3 yrs and "big blocks of times I can't remember what I did. She has recently started having burst of anger in which she doesn't recall.  Mary Harper was admitted to the adult unit for worsening symptoms of depression citing memory lapses. She also reported problems with binging on alcohol. However, her BAL upon admission was <5. She did not receive detox treatment. During her admission assessment, she was evaluated and her symptoms were identified. Medication management was discussed and initiated targeting her presenting symptoms. She was oriented to the unit and encouraged to participate in the unit programming.   During her hospital stay, Mary Harper was evaluated each day by a clinical provider to ascertain her response to her treatment regimen. Medication changes & adjustments were made according to need. As the day goes by, improvement was noted as evidence by her reports of decreasing symptoms, improved sleep, appetite, affect, medication tolerance, behavior, and participation in the unit programming.  She was required on daily basis to complete a self inventory asssessment noting mood, mental status, pain, new symptoms, anxiety and concerns. Her symptoms responded well to her treatment regimen, being in a therapeutic and supportive environment also assisted in her mood stability. Mary Harper did present appropriate behavior & was motivated for recovery. She worked closely with the treatment team and case manager to develop a discharge plan with appropriate goals to maintain mood stability after discharge. Coping skills, problem solving as well as relaxation therapies were also part of the unit programming.  On this day of her discharge, Mary Harper was in much improved condition than upon admission. Her                symptoms were reported as significantly decreased or resolved completely. Upon discharge, she denies any SI/HI and voiced no AVH. She was  motivated to continue taking medication with a goal of continued improvement in mental health. She was medicated & discharge on; Gabapentin 300 mg tid for agitation, Hydroxyzine 50 Q hs for anxiety/insomnia, Abilify 5 mg Q hs for mood control, Trazodone 150 mg Q hs for insomnia, Wellbutrin Xl 150 mg daily for depression, Fluoxetine 20 mg for depression, Lithium Citrate 300 mg/ml for mood stabilization, Prazosin 1 mg capsule for nightmares, Depakote 250 mg/ml for mood stabilization & Eszopiclone 3 mg for insomnia. She is [facility] [place] & medication management. She is provided with all the necessary information needed to make this appointments without problems. Mary Harper was provided with a 4 days worth, supply [facility]. She left [facility] in no apparent distress. Transportation per ex-husband.  Consults:  psychiatry  Significant Diagnostic Studies:  labs: CBC with diff, CMP, UDS, toxicology tests, U/A, Lithium & Depakote levels, results reviewed, stable  Discharge Vitals:   Blood pressure 90/63, pulse 70, temperature 97.7 F (36.5 C), temperature source Oral, resp. rate 18, height 5\' 3"  (1.6 m), weight 92.534 kg (204 lb), last menstrual period 09/09/2014. Body mass  index is 36.15 kg/(m^2). Lab Results:   Results for orders placed or performed during the hospital encounter of 10/03/14 (from the past 72 hour(s))  Lipid panel     Status: None   Collection Time: 10/12/14  7:57 PM  Result Value Ref Range   Cholesterol 148 0 - 200 mg/dL   Triglycerides 82 <161 mg/dL   HDL 55 >09 mg/dL   Total CHOL/HDL Ratio 2.7 RATIO   VLDL 16 0 - 40 mg/dL   LDL Cholesterol 77 0 - 99 mg/dL    Comment:        Total Cholesterol/HDL:CHD Risk Coronary Heart Disease Risk Table                     Men   Women  1/2 Average Risk   3.4   3.3  Average Risk       5.0   4.4  2 X Average Risk   9.6   7.1  3 X Average Risk  23.4    11.0        Use the calculated Patient Ratio above and the CHD Risk Table to determine the patient's CHD Risk.        ATP III CLASSIFICATION (LDL):  <100     mg/dL   Optimal  604-540  mg/dL   Near or Above                    Optimal  130-159  mg/dL   Borderline  981-191  mg/dL   High  >478     mg/dL   Very High Performed at [facility]   TSH     Status: None   Collection Time: 10/12/14  7:58 PM  Result Value Ref Range   TSH 3.510 0.350 - 4.500 uIU/mL    Comment: Performed at [facility]    Physical Findings: AIMS: Facial and Oral Movements Muscles of Facial Expression: None, normal Lips and Perioral Area: None, normal Jaw: None, normal Tongue: None, normal,Extremity Movements Upper (arms, wrists, hands, fingers): None, normal Lower (legs, knees, ankles, toes): None, normal, Trunk Movements Neck, shoulders, hips: None, normal, Overall Severity Severity of abnormal movements (highest score from questions above): None, normal Incapacitation due to abnormal movements: None, normal Patient's awareness of abnormal movements (rate only patient's report): No Awareness, Dental Status Current problems with teeth and/or dentures?: No Does patient usually wear dentures?: No  CIWA:  CIWA-Ar Total: 1 COWS:  COWS Total Score: 2   See Psychiatric Specialty Exam and Suicide Risk Assessment completed by Attending Physician prior to discharge.  Discharge destination:  Home  Is patient on multiple antipsychotic therapies at discharge:  No   Has Patient had three or more failed trials of antipsychotic monotherapy by history:  No  Recommended Plan for Multiple Antipsychotic Therapies: NA    Medication List    TAKE these medications      Indication   acetaminophen 325 MG tablet  Commonly known as:  TYLENOL  Take 2 tablets (650 mg total) by mouth every 6 (six) hours as needed for headache (headache).   Indication:  Pain, Headache     ARIPiprazole 5 MG  tablet  Commonly known as:  ABILIFY  Take 1 tablet (5 mg total) by mouth daily. For mood control   Indication:  Mood control     buPROPion 150 MG 24 hr tablet  Commonly known as:  WELLBUTRIN XL  Take 1 tablet (150 mg total)  by mouth daily. For depression   Indication:  Depressive Phase of Manic-Depression     Eszopiclone 3 MG Tabs  Take 1 tablet (3 mg total) by mouth at bedtime. Take immediately before bedtime: For sleep   Indication:  Trouble Sleeping     FLUoxetine 20 MG capsule  Commonly known as:  PROZAC  Take 1 capsule (20 mg total) by mouth daily. For depression   Indication:  Major Depressive Disorder     gabapentin 300 MG capsule  Commonly known as:  NEURONTIN  Take 1 capsule (300 mg total) by mouth 3 (three) times daily. For agitation   Indication:  Agitation     hydrOXYzine 50 MG tablet  Commonly known as:  ATARAX/VISTARIL  Take 1 tablet (50 mg total) by mouth at bedtime as needed for anxiety (anxiety).   Indication:  Tension, Anxiety     lithium citrate 300 MG/5ML solution  Take 15 mLs (900 mg total) by mouth at bedtime. For mood stabilization   Indication:  Mood stabilization     prazosin 1 MG capsule  Commonly known as:  MINIPRESS  Take 1 capsule (1 mg total) by mouth at bedtime. For nightmares   Indication:  Nightmares     traZODone 150 MG tablet  Commonly known as:  DESYREL  Take 1 tablet (150 mg total) by mouth at bedtime. For sleep   Indication:  Trouble Sleeping     Valproic Acid 250 MG/5ML Syrp syrup  Commonly known as:  DEPAKENE  Take 15 mLs (750 mg total) by mouth 2 (two) times daily. For mood stabilization   Indication:  Mood stabilization       Follow-up Information    Follow up with [name] .   Why:  Please contact your [name] at discharge to resume services.   Contact information:   [address] [place]  [place], [place] [address] Phone:(336) [phone]     Follow-up recommendations: Activity:  As tolerated Diet: As recommended  by your primary care doctor. Keep all scheduled follow-up appointments as recommended.   Comments:  Take all your medications as prescribed by your mental healthcare provider. Report any adverse effects and or reactions from your medicines to your outpatient provider promptly. Patient is instructed and cautioned to not engage in alcohol and or illegal drug use while on prescription medicines. In the event of worsening symptoms, patient is instructed to call the crisis hotline, 911 and or go to the nearest ED for appropriate evaluation and treatment of symptoms. Follow-up with your primary care provider for your other medical issues, concerns and or health care needs.   Total Discharge Time: Greater than 30 minutes  Signed: Sanjuana Kava, PMHNp, FNP-BC 10/14/2014, 5:33 PM  I personally assessed the patient and formulated the plan Madie Reno A. Dub Mikes, M.D.

## 2014-10-15 MED ORDER — ESZOPICLONE 3 MG PO TABS: 3.0000 mg | ORAL_TABLET | Freq: Every day | ORAL | Status: DC

## 2014-10-15 NOTE — [facility] Group Notes (Signed)
[facility] Note   10/15/2014 11:14 AM  Participation Quality:  Appropriate   Mood/Affect:  Appropriate  Depression Rating:  0  Anxiety Rating:  3 "It's more of an excited anxiety. I'm looking forward to going to [place] today."   Thoughts of Suicide:  No Will you contract for safety?   NA  Current AVH:  No  Plan for Discharge/Comments:  Pt reports feeling good today with no withdrawals and no depression. Pt states that her ex-husband is taking her to [place] this morning and she will stay there for a few weeks with family. Pt instructed to notify her ACT team at d/c to resume services.   Transportation Means: ex husband coming at 10:00AM.   Supports: family    Smart, Lebron QuamHeather  LCSWA

## 2014-10-15 NOTE — Progress Notes (Signed)
  [facility] Adult [facility] Management Discharge Plan :  Will you be returning to the same living situation after discharge:  Yes,  home. Pt plans to stay with family in [place] for a few weeks but will return to her home At discharge, do you have transportation home?: Yes,  ex husband Do you have the ability to pay for your medications: Yes,  Medicare  Release of information consent forms completed and submitted to Medical Records by CSW.  Patient to Follow up at: Follow-up Information    Follow up with [name] .   Why:  Please contact your [name] at discharge to resume services.   Contact information:   [address] [place],  [place], [place] [address] [phone]      Patient denies SI/HI: Yes,  during group/self report.     Safety Planning and Suicide Prevention discussed: Yes,  SPE completed with ex husband. SPI pamphlet provided to pt and she was encouraged to share information with support network, ask questions, and talk about any concerns relating to SPE.  Have you used any form of tobacco in the last 30 days? (Cigarettes, Smokeless Tobacco, Cigars, and/or Pipes): No  Has patient been referred to the Quitline?: N/A patient is not a smoker  Smart, CraneHeather LCSWA  10/15/2014, 11:21 AM

## 2014-10-15 NOTE — [facility] Suicide Risk Assessment (Signed)
[facility] Discharge Suicide Risk Assessment   Demographic Factors:  Caucasian  Total Time spent with patient: 30 minutes  Musculoskeletal: Strength & Muscle Tone: within normal limits Gait & Station: normal Patient leans: N/A  Psychiatric Specialty Exam: Physical Exam  Review of Systems  Constitutional: Negative.   HENT: Negative.   Eyes: Negative.   Respiratory: Negative.   Cardiovascular: Negative.   Gastrointestinal: Negative.   Genitourinary: Negative.   Musculoskeletal: Negative.   Skin: Negative.   Neurological: Negative.   Endo/Heme/Allergies: Negative.   Psychiatric/Behavioral: Positive for depression.    Blood pressure 93/66, pulse 79, temperature 97.7 F (36.5 C), temperature source Oral, resp. rate 20, height  (1.6 m), weight 92.534 kg (204 lb), last menstrual period 09/09/2014.Body mass index is 36.15 kg/(m^2).  General Appearance: Fairly Groomed  Patent attorney::  Fair  Speech:  Clear and Coherent409  Volume:  Normal  Mood:  Euthymic  Affect:  Appropriate  Thought Process:  Coherent and Goal Directed  Orientation:  Full (Time, Place, and Person)  Thought Content:  plans as she moves on, relapse prevention plan  Suicidal Thoughts:  No  Homicidal Thoughts:  No  Memory:  Immediate;   Fair Recent;   Fair Remote;   Fair  Judgement:  Fair  Insight:  Present  Psychomotor Activity:  Normal  Concentration:  Fair  Recall:  [name] of Knowledge:Fair  Language: Fair  Akathisia:  No  Handed:  Right  AIMS (if indicated):     Assets:  Desire for Improvement Housing Social Support  Sleep:  Number of Hours: 6.25  Cognition: WNL  ADL's:  Intact   Have you used any form of tobacco in the last 30 days? (Cigarettes, Smokeless Tobacco, Cigars, and/or Pipes): No  Has this patient used any form of tobacco in the last 30 days? (Cigarettes, Smokeless Tobacco, Cigars, and/or Pipes) No  Mental Status Per Nursing Assessment::   On Admission:  Suicidal ideation  indicated by patient  Current Mental Status by Physician: In full contact with reality. There are no active SI plans or intent. She states her mood is much improved, she is going to stay with her family in [place] for a little while. She is going to continue follow up with her ACTT in [place]   Loss Factors: NA  Historical Factors: NA  Risk Reduction Factors:   Positive social support and Positive therapeutic relationship  Continued Clinical Symptoms:  Depression:   Insomnia  Cognitive Features That Contribute To Risk:  None    Suicide Risk:  Minimal: No identifiable suicidal ideation.  Patients presenting with no risk factors but with morbid ruminations; may be classified as minimal risk based on the severity of the depressive symptoms  Principal Problem: Bipolar affective disorder, depressed, severe Discharge Diagnoses:  Patient Active Problem List   Diagnosis Date Noted  . Suicidal ideations [R45.851] 10/03/2014    Priority: High  . Bipolar affective disorder, depressed, severe [F31.4] 07/13/2014    Priority: High  . 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 Alcohol intoxication [F10.129]     Follow-up Information    Follow up with [name] .   Why:  Please contact your [name] at discharge to resume services.   Contact information:   [address] [place]  [place], [place] [address] Phone:([phone]      Plan Of Care/Follow-up recommendations:  Activity:  as tolerated Diet:  regular Follow up [name] Is patient on multiple antipsychotic therapies  at discharge:  No   Has Patient had three or more failed trials of antipsychotic monotherapy by history:  No  Recommended Plan for Multiple Antipsychotic Therapies: NA    Dionisio Aragones A 10/15/2014, 8:56 AM

## 2014-10-15 NOTE — Progress Notes (Signed)
D:  Patient's self inventory sheet, patient slept good last night, sleep medication was given.  Good appetite, normal energy level, good concentration.  Denied depression and hopeless, rated anxiety #2.  Denied withdrawals.  Denied SI.  Denied physical problems.  Denied pain.  Plan is to go home and see her family.  Does have discharge plans. A:  Medications given per MD orders.  Emotional support and encouragement given patient. R:  Denied SI and HI, contracts for safety.  Denied A/V hallucinations.  Safety maintained with 15 minute checks.

## 2014-10-15 NOTE — Progress Notes (Signed)
Pt was escorted out and given all her belongings. Pt stated she could not wait for pharmacy to return to give her samples. Her ride was on a tight schedule and they had to leave. Pt. Stated she has numbers of emergency contacts should she need them and does contract for safety. Pt stated she has not had alcohol in 4.5 weeks and does not plan to drink again. Pt appears pleasant and cooperative with good eye contact.

## 2014-10-17 NOTE — Progress Notes (Signed)
Patient Discharge Instructions:  After Visit Summary (AVS):   Faxed to:  10/17/14 Discharge Summary Note:   Faxed to:  10/17/14 Psychiatric Admission Assessment Note:   Faxed to:  10/17/14 Suicide Risk Assessment - Discharge Assessment:   Faxed to:  10/17/14 Faxed/Sent to the Next Level Care provider:  10/17/14 Faxed to [name] @ [phone]  Jerelene ReddenSheena E Blairsden, 10/17/2014, 3:04 PM

## 2014-11-25 ENCOUNTER — Emergency Department (HOSPITAL_COMMUNITY)
Admission: EM | Admit: 2014-11-25 | Discharge: 2014-11-26 | Payer: Medicare Other | Attending: Emergency Medicine | Admitting: Emergency Medicine

## 2014-11-25 ENCOUNTER — Encounter (HOSPITAL_COMMUNITY): Payer: Self-pay | Admitting: Emergency Medicine

## 2014-11-25 DIAGNOSIS — Z008 Encounter for other general examination: Secondary | ICD-10-CM | POA: Diagnosis present

## 2014-11-25 DIAGNOSIS — Z79899 Other long term (current) drug therapy: Secondary | ICD-10-CM | POA: Insufficient documentation

## 2014-11-25 DIAGNOSIS — R45851 Suicidal ideations: Secondary | ICD-10-CM | POA: Insufficient documentation

## 2014-11-25 DIAGNOSIS — Z8719 Personal history of other diseases of the digestive system: Secondary | ICD-10-CM | POA: Insufficient documentation

## 2014-11-25 DIAGNOSIS — F10129 Alcohol abuse with intoxication, unspecified: Secondary | ICD-10-CM | POA: Diagnosis not present

## 2014-11-25 DIAGNOSIS — F10929 Alcohol use, unspecified with intoxication, unspecified: Secondary | ICD-10-CM | POA: Diagnosis present

## 2014-11-25 LAB — CBC WITH DIFFERENTIAL/PLATELET
Basophils Absolute: 0 10*3/uL (ref 0.0–0.1)
Basophils Relative: 0 % (ref 0–1)
Eosinophils Absolute: 0 10*3/uL (ref 0.0–0.7)
Eosinophils Relative: 0 % (ref 0–5)
HCT: 37.5 % (ref 36.0–46.0)
Hemoglobin: 12.2 g/dL (ref 12.0–15.0)
Lymphocytes Relative: 32 % (ref 12–46)
Lymphs Abs: 2.6 10*3/uL (ref 0.7–4.0)
MCH: 31.5 pg (ref 26.0–34.0)
MCHC: 32.5 g/dL (ref 30.0–36.0)
MCV: 96.9 fL (ref 78.0–100.0)
Monocytes Absolute: 0.4 10*3/uL (ref 0.1–1.0)
Monocytes Relative: 5 % (ref 3–12)
Neutro Abs: 5 10*3/uL (ref 1.7–7.7)
Neutrophils Relative %: 63 % (ref 43–77)
Platelets: 267 10*3/uL (ref 150–400)
RBC: 3.87 MIL/uL (ref 3.87–5.11)
RDW: 12.7 % (ref 11.5–15.5)
WBC: 7.9 10*3/uL (ref 4.0–10.5)

## 2014-11-25 LAB — BASIC METABOLIC PANEL
Anion gap: 8 (ref 5–15)
BUN: 8 mg/dL (ref 6–23)
CO2: 20 mmol/L (ref 19–32)
Calcium: 8 mg/dL — ABNORMAL LOW (ref 8.4–10.5)
Chloride: 107 mmol/L (ref 96–112)
Creatinine, Ser: 0.42 mg/dL — ABNORMAL LOW (ref 0.50–1.10)
GFR calc Af Amer: >90 mL/min (ref >90)
GFR calc non Af Amer: >90 mL/min (ref >90)
Glucose, Bld: 81 mg/dL (ref 70–99)
Potassium: 4 mmol/L (ref 3.5–5.1)
Sodium: 135 mmol/L (ref 135–145)

## 2014-11-25 LAB — ETHANOL: Alcohol, Ethyl (B): 132 mg/dL — ABNORMAL HIGH (ref 0–9)

## 2014-11-25 LAB — RAPID URINE DRUG SCREEN, HOSP PERFORMED
Amphetamines: POSITIVE — AB
Barbiturates: NOT DETECTED
Benzodiazepines: NOT DETECTED
Cocaine: NOT DETECTED
Opiates: NOT DETECTED
Tetrahydrocannabinol: NOT DETECTED
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
## 2014-11-26 ENCOUNTER — Inpatient Hospital Stay
Admission: AD | Admit: 2014-11-26 | Discharge: 2014-12-05 | DRG: 885 | Disposition: A | Discharge status: Home / Self Care | Payer: Medicare Other | Attending: Psychiatry | Admitting: Psychiatry

## 2014-11-26 DIAGNOSIS — F4310 Post-traumatic stress disorder, unspecified: Secondary | ICD-10-CM | POA: Diagnosis present

## 2014-11-26 DIAGNOSIS — R45851 Suicidal ideations: Secondary | ICD-10-CM | POA: Diagnosis present

## 2014-11-26 DIAGNOSIS — Z9884 Bariatric surgery status: Secondary | ICD-10-CM | POA: Diagnosis not present

## 2014-11-26 DIAGNOSIS — Z811 Family history of alcohol abuse and dependence: Secondary | ICD-10-CM

## 2014-11-26 DIAGNOSIS — R51 Headache: Secondary | ICD-10-CM | POA: Diagnosis present

## 2014-11-26 DIAGNOSIS — Z9114 Patient's other noncompliance with medication regimen: Secondary | ICD-10-CM | POA: Diagnosis present

## 2014-11-26 DIAGNOSIS — G4700 Insomnia, unspecified: Secondary | ICD-10-CM | POA: Diagnosis present

## 2014-11-26 DIAGNOSIS — S0083XA Contusion of other part of head, initial encounter: Secondary | ICD-10-CM | POA: Diagnosis present

## 2014-11-26 DIAGNOSIS — E039 Hypothyroidism, unspecified: Secondary | ICD-10-CM | POA: Diagnosis present

## 2014-11-26 DIAGNOSIS — Z79899 Other long term (current) drug therapy: Secondary | ICD-10-CM

## 2014-11-26 DIAGNOSIS — Z915 Personal history of self-harm: Secondary | ICD-10-CM | POA: Diagnosis not present

## 2014-11-26 DIAGNOSIS — Y906 Blood alcohol level of 120-199 mg/100 ml: Secondary | ICD-10-CM | POA: Diagnosis present

## 2014-11-26 DIAGNOSIS — F319 Bipolar disorder, unspecified: Principal | ICD-10-CM | POA: Diagnosis present

## 2014-11-26 DIAGNOSIS — F1020 Alcohol dependence, uncomplicated: Secondary | ICD-10-CM | POA: Diagnosis present

## 2014-11-26 DIAGNOSIS — E538 Deficiency of other specified B group vitamins: Secondary | ICD-10-CM | POA: Diagnosis present

## 2014-11-26 DIAGNOSIS — Z8659 Personal history of other mental and behavioral disorders: Secondary | ICD-10-CM

## 2014-11-26 DIAGNOSIS — Z888 Allergy status to other drugs, medicaments and biological substances status: Secondary | ICD-10-CM

## 2014-11-26 DIAGNOSIS — F10229 Alcohol dependence with intoxication, unspecified: Secondary | ICD-10-CM | POA: Diagnosis present

## 2014-11-26 DIAGNOSIS — Z91410 Personal history of adult physical and sexual abuse: Secondary | ICD-10-CM | POA: Diagnosis present

## 2014-11-26 DIAGNOSIS — Z91128 Patient's intentional underdosing of medication regimen for other reason: Secondary | ICD-10-CM | POA: Diagnosis present

## 2014-11-26 DIAGNOSIS — F10129 Alcohol abuse with intoxication, unspecified: Secondary | ICD-10-CM

## 2014-11-26 DIAGNOSIS — F3130 Bipolar disorder, current episode depressed, mild or moderate severity, unspecified: Secondary | ICD-10-CM

## 2014-11-26 MED ORDER — LOPERAMIDE HCL 2 MG PO CAPS: 2.0000 mg | ORAL_CAPSULE | Freq: Four times a day (QID) | ORAL | Status: DC | PRN

## 2014-11-26 MED ORDER — TRAZODONE HCL 100 MG PO TABS: 100.0000 mg | ORAL_TABLET | Freq: Every evening | ORAL | Status: DC | PRN

## 2014-11-26 MED ORDER — FLUOXETINE HCL 20 MG PO CAPS
20.0000 mg | ORAL_CAPSULE | Freq: Every day | ORAL | Status: DC
Start: 2014-11-26 — End: 2014-11-26
  Administered 2014-11-26: 20 mg via ORAL
  Filled 2014-11-26: qty 1

## 2014-11-26 MED ORDER — LITHIUM CARBONATE ER 450 MG PO TBCR
450.0000 mg | EXTENDED_RELEASE_TABLET | Freq: Two times a day (BID) | ORAL | Status: DC
  Administered 2014-11-26: 450 mg via ORAL
  Filled 2014-11-26 (×2): qty 1

## 2014-11-26 MED ORDER — LORAZEPAM 1 MG PO TABS: 1.0000 mg | ORAL_TABLET | Freq: Three times a day (TID) | ORAL | Status: DC | PRN

## 2014-11-26 MED ORDER — ARIPIPRAZOLE 5 MG PO TABS
5.0000 mg | ORAL_TABLET | Freq: Every day | ORAL | Status: DC
  Administered 2014-11-26: 5 mg via ORAL
  Filled 2014-11-26: qty 1

## 2014-11-26 MED ORDER — ACETAMINOPHEN 325 MG PO TABS
650.0000 mg | ORAL_TABLET | ORAL | Status: DC | PRN
  Administered 2014-11-26 (×2): 650 mg via ORAL
  Filled 2014-11-26 (×2): qty 2
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
## 2014-11-26 NOTE — Progress Notes (Signed)
  [facility] ED CM spoke with pt on how to obtain an in network pcp with insurance coverage via the customer service number or web site  Cm reviewed ED level of care for crisis/emergent services and community pcp level of care to manage continuous or chronic medical concerns.  The pt voiced understanding CM encouraged pt and discussed pt's responsibility to verify with pt's insurance carrier that any recommended medical provider offered by any emergency room or a hospital provider is within the carrier's network. The pt voiced understanding  Pt given a list of medicare providers within zip [phone]

## 2014-11-26 NOTE — ED Notes (Signed)
[facility], she sts pt will be going to room 303, however due to limited staff available she asked to send pt around 1900, after shift change.

## 2014-11-26 NOTE — ED Notes (Signed)
Pt called out and c/o diarrhea, sts she has it on and off all the time. Pt reports having at least 10 episodes of diarrhea in the last two hours, however pt observed going to bathroom 3 times since the beginning of this writer's shift (0700). Will notify MD and continue to monitor.

## 2014-11-26 NOTE — Progress Notes (Signed)
[facility] and accepted by Dr. Jennet MaduroPucilowska.  Call report to [phone].  If possible please begin to set up transportation for transfer around 5pm.  CSW informed Baxter HireKristen, TTS of the updated disposition.     Maryelizabeth Rowanressa Karlita Lichtman, MSW, LCSW, LCAS-A Clinical Social Worker [phone]

## 2014-11-26 NOTE — ED Notes (Signed)
[facility] called to transport [facility]

## 2014-11-26 NOTE — ED Provider Notes (Signed)
Pt requesting something for diarrhea.  Nurse noted she has been to bathroom three times since 0700.  Will rx imodium prn.  Linwood DibblesJon Edelyn Heidel, MD 11/26/14 [phone]

## 2014-11-26 NOTE — BHH Counselor (Signed)
Inpatient recommended per Dr. Jannifer FranklinAkintayo and Julieanne CottonJosephine NP. TTS to seek placement.   Kateri PlummerKristin Lakeesha Fontanilla, M.S., LPCA, [place], [facility] Licensed Professional Counselor Associate  Triage Specialist  [facility]  Therapeutic Triage Services Phone: [phone] Fax: [phone]
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
## 2014-11-26 NOTE — B[facility] Counselor (Signed)
Accepted to [facility] by Dr. Pucilowska can come close to 5p. Report #[phone].  Sweetie Giebler, M.S., LPCA, LCASA, NCC Licensed Professional Counselor Associate  Triage Specialist  [facility]  Therapeutic Triage Services Phone: [phone] Fax: [phone]  

## 2014-11-27 LAB — URINALYSIS, COMPLETE
BILIRUBIN, UR: NEGATIVE
Blood: NEGATIVE
NITRITE: NEGATIVE
Ph: 6 (ref 4.5–8.0)
Protein: NEGATIVE
Specific Gravity: 1.01 (ref 1.003–1.030)

## 2014-11-27 LAB — COMPREHENSIVE METABOLIC PANEL
ALBUMIN: 4 g/dL
ANION GAP: 8 (ref 7–16)
Alkaline Phosphatase: 67 U/L
BUN: 7 mg/dL
Bilirubin,Total: 0.5 mg/dL
CALCIUM: 9.3 mg/dL
CHLORIDE: 102 mmol/L
Co2: 28 mmol/L
Creatinine: 0.61 mg/dL
EGFR (African American): >60
EGFR (Non-African Amer.): >60
Glucose: 114 mg/dL — ABNORMAL HIGH
Potassium: 4.3 mmol/L
SGOT(AST): 27 U/L
SGPT (ALT): 17 U/L
Sodium: 138 mmol/L
TOTAL PROTEIN: 7 g/dL

## 2014-11-27 LAB — PREGNANCY, URINE: PREGNANCY TEST, URINE: NEGATIVE m[IU]/mL

## 2014-11-27 LAB — LITHIUM LEVEL
LITHIUM: 0.25 mmol/L — AB (ref 0.60–1.20)
Lithium: 0.4 mmol/L — ABNORMAL LOW (ref 0.60–1.20)

## 2014-11-27 LAB — VALPROIC ACID LEVEL: Valproic Acid: 27 ug/mL — ABNORMAL LOW (ref 50–100)

## 2014-11-27 LAB — TSH: THYROID STIMULATING HORM: 2.173 u[IU]/mL

## 2014-12-01 MED ORDER — PRAZOSIN HCL 1 MG PO CAPS
1.0000 mg | ORAL_CAPSULE | Freq: Every day | ORAL | Status: DC
  Administered 2014-12-02 – 2014-12-03 (×2): 1 mg via ORAL
  Filled 2014-12-01 (×3): qty 1

## 2014-12-01 MED ORDER — TRAMADOL HCL 50 MG PO TABS
50.0000 mg | ORAL_TABLET | Freq: Three times a day (TID) | ORAL | Status: DC | PRN
  Administered 2014-12-02 – 2014-12-03 (×2): 50 mg via ORAL
  Filled 2014-12-01 (×2): qty 1

## 2014-12-01 MED ORDER — TRAZODONE HCL 50 MG PO TABS
150.0000 mg | ORAL_TABLET | Freq: Every day | ORAL | Status: DC
  Administered 2014-12-02 – 2014-12-04 (×3): 150 mg via ORAL
  Filled 2014-12-01 (×3): qty 1

## 2014-12-01 MED ORDER — ACETAMINOPHEN 325 MG PO TABS: 650.0000 mg | ORAL_TABLET | ORAL | Status: DC | PRN

## 2014-12-01 MED ORDER — HYDROXYZINE HCL 50 MG PO TABS
50.0000 mg | ORAL_TABLET | Freq: Three times a day (TID) | ORAL | Status: DC | PRN
Start: 2014-12-02 — End: 2014-12-05
  Administered 2014-12-03: 50 mg via ORAL
  Filled 2014-12-01: qty 1

## 2014-12-01 MED ORDER — VALPROIC ACID 250 MG/5ML PO SYRP
750.0000 mg | ORAL_SOLUTION | Freq: Two times a day (BID) | ORAL | Status: DC
  Administered 2014-12-02 – 2014-12-05 (×7): 750 mg via ORAL
  Filled 2014-12-01 (×3): qty 20
  Filled 2014-12-01: qty 15
  Filled 2014-12-01 (×3): qty 20
  Filled 2014-12-01: qty 15
  Filled 2014-12-01 (×2): qty 20
  Filled 2014-12-01 (×2): qty 15
  Filled 2014-12-01: qty 20
  Filled 2014-12-01: qty 15
  Filled 2014-12-01: qty 20
  Filled 2014-12-01: qty 15

## 2014-12-01 MED ORDER — CYANOCOBALAMIN 1000 MCG/ML IJ SOLN
1000.0000 ug | Freq: Every day | INTRAMUSCULAR | Status: AC
  Administered 2014-12-02 – 2014-12-04 (×3): 1000 ug via INTRAMUSCULAR
  Filled 2014-12-01 (×3): qty 1

## 2014-12-01 MED ORDER — BUPROPION HCL 75 MG PO TABS
75.0000 mg | ORAL_TABLET | Freq: Two times a day (BID) | ORAL | Status: DC
  Administered 2014-12-02 – 2014-12-05 (×7): 75 mg via ORAL
  Filled 2014-12-01 (×7): qty 1

## 2014-12-01 MED ORDER — MAGNESIUM HYDROXIDE 400 MG/5ML PO SUSP: 30.0000 mL | Freq: Every evening | ORAL | Status: DC | PRN

## 2014-12-02 DIAGNOSIS — F1020 Alcohol dependence, uncomplicated: Secondary | ICD-10-CM | POA: Diagnosis present

## 2014-12-02 DIAGNOSIS — F3130 Bipolar disorder, current episode depressed, mild or moderate severity, unspecified: Secondary | ICD-10-CM

## 2014-12-02 MED ORDER — IBUPROFEN 600 MG PO TABS
600.0000 mg | ORAL_TABLET | Freq: Four times a day (QID) | ORAL | Status: DC | PRN
  Administered 2014-12-02: 600 mg via ORAL
  Filled 2014-12-02: qty 1

## 2014-12-02 MED ORDER — CYCLOBENZAPRINE HCL 10 MG PO TABS
5.0000 mg | ORAL_TABLET | Freq: Three times a day (TID) | ORAL | Status: DC | PRN
  Administered 2014-12-02: 10 mg via ORAL
  Administered 2014-12-03: 5 mg via ORAL
  Filled 2014-12-02 (×2): qty 1

## 2014-12-02 NOTE — Progress Notes (Signed)
Patient states she is still having intermittent thoughts of killing herself. Discussed meditation techniques with her to help distract her from obsessive thinking. Patient receptive to that. She contracts for safety. Affect flat and mood sad. She is going to groups and interacting with peers. Will continue to monitor.

## 2014-12-02 NOTE — [facility] Group Notes (Signed)
[facility] Group Notes:  (Nursing/MHT/Case Management/Adjunct)  Date:  12/02/2014  Time:  9:35 PM  Type of Therapy:  Group Therapy  Participation Level:  Did Not Attend  Participation Quality:  non applicable  Affect:  n/a  Cognitive:  n/a  Insight:  None  Engagement in Group:  n/a  Modes of Intervention:  n/a  Summary of Progress/Problems:  Lorre MunroeClarence Melvin Leith Hedlund 12/02/2014, 9:35 PM
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
## 2014-12-02 NOTE — Plan of Care (Signed)
Problem: Alteration in mood Goal: LTG-Patient reports reduction in suicidal thoughts (Patient reports reduction in suicidal thoughts and is able to verbalize a safety plan for whenever patient is feeling suicidal)  Outcome: Progressing Working on focusing on activities that can distract her thoughts such as word searches when she is ruminating on suicidal thoughts
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
## 2014-12-03 ENCOUNTER — Encounter: Payer: Self-pay | Admitting: Psychiatry

## 2014-12-03 NOTE — Progress Notes (Signed)
Headache has returned. Patient is resting in bed with eyes closed. She does not want any more pain medication right now. Advised to let nurse know how we can help.

## 2014-12-03 NOTE — Progress Notes (Signed)
LC SW called [facility] to inquire about availability. She reported that there are no beds available and pt has been at [facility] 3 times in the last 12 months. Director indicated a long wait list and suggested this LC SW make an alternative plan. Will consult psychiatrist

## 2014-12-03 NOTE — Progress Notes (Signed)
Pt resting in her bed at change of shift. Mood sad denies suicidal thoughts at this time. Affect flat. Med compliant. Voices no complaints. Appears to be resting at this time.
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
## 2014-12-03 NOTE — Progress Notes (Signed)
[facility] SW met with patient and she reported she called the apartment complex [place] [facility] [place] today and they will hold an apartment for her. She is agreeable to an out patient treatment program. [facility] SW mentioned a place in [place] called [facility] they accept medicaid and people with no insurance. They will do a full assessment. [facility] SW will discuss and consult with Dr Jerilee Hoh in treatment meeting.

## 2014-12-03 NOTE — BHH Group Notes (Signed)
°  Understanding Treatment PsychoEd Group Attended and participated appropriately. Discussed need to continue to take medication and become involved in support groups.
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
## 2014-12-03 NOTE — Clinical Social Work Note (Signed)
Called [facility]  to obtain an authorization for [facility]  Authorization number # [id number] Called  [facility] and relayed authorization number to Rulon SeraPage Terri, awaiting call back from [facility]- Director

## 2014-12-03 NOTE — BHH Group Notes (Signed)
B[facility] Group Notes:  (Nursing/MHT/Case Management/Adjunct)  Date:  12/03/2014  Time:  5:59 PM  Type of Therapy:  recreation  Participation Level:  Active  Participation Quality:  Appropriate  Affect:  Appropriate  Cognitive:  Appropriate  Insight:  Appropriate  Engagement in Group:  Engaged  Modes of Intervention:  Activity and Socialization  Summary of Progress/Problems:  Mary Harper 12/03/2014, 5:59 PM

## 2014-12-03 NOTE — BHH Group Notes (Signed)
BHH Group Notes:  (Nursing/MHT/Case Management/Adjunct)  Date:  12/03/2014  Time:  12:08 PM  Type of Therapy:  Psychoeducational Skills  Participation Level:  Active  Participation Quality:  Appropriate, Attentive and Sharing  Affect:  Appropriate  Cognitive:  Alert and Appropriate  Insight:  Appropriate  Engagement in Group:  Engaged  Modes of Intervention:  Discussion and Education  Summary of Progress/Problems:  [facility] 12/03/2014, 12:08 PM
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
## 2014-12-03 NOTE — Progress Notes (Signed)
Recreation Therapy Group Note Template ° ° ° °Date: 05.02.16 °Time: 3:00 PM °Location: Craft Room ° °Group Topic: Self-Expression ° °Goal Area(s) Addresses:  °Patient will identify one color per emotion listed on wheel. °Patient will verbalize benefit of using art as a means of self-expression. °Patient will verbalize one emotion experienced during session. °Patient will be educated on other forms of self-expression. ° °Behavioral Response: Patient did not attend group. ° °Intervention: Emotion Wheel ° °Activity: Patients were given a worksheet with 7 different emotions. Patients were instructed to pick a color for each emotion and color the designated section.  ° °Education: LRT educated patients on self-expression and different forms of self-expression.  ° °Education Outcome: Patient did not attend group.  ° °Clinical Observations/Feedback: Patient did not attend group.  ° ° ° ° ° °Elizabeth M Greene, LRT/CTRS °12/03/2014 4:02 PM °

## 2014-12-03 NOTE — Progress Notes (Signed)
Recreation Therapy Notes  INPATIENT RECREATION THERAPY ASSESSMENT  Patient Details Name: Mary Harper MRN: [id number] DOB: 1966/05/21 Today's Date: 12/03/2014   Patient's assessment completed 04.26.16 at 11:47 am.  Patient Stressors:  Family, Relationship, and Friends  Coping Skills:    Isolation, Substance Abuse, Avoidance, Exercise, Art, Talking, Music, Sports (watching), and Other (reading and writing)  Personal Challenges:  Concentration, Decision-Making, Expressing yourself, Relationships, Self-esteem, and Substance abuse  Leisure Interests (2+):   Reading and watching movies  Awareness of Community Resources:   Yes  Community Resources:   Equities traderLibrary and Resources with ACT Team  Current Use:  Yes  If no, Barriers?:    Patient Strengths:   ParamedicLoving and generous  Patient Identified Areas of Improvement:   Making friends  Current Recreation Participation:   Reading  Patient Goal for Hospitalization:   To not want to die  [place]   [facility]  [place] Residence[place]   [place]   Current [place] (including self-harm):   Yes  Current HI:   No  Consent to Intern Participation:  N/A   Jacquelynn Creelizabeth M Greene, LRT/CTRS 12/03/2014, 5:10 PM

## 2014-12-04 LAB — VALPROIC ACID LEVEL: VALPROIC ACID LVL: 59 ug/mL (ref 50.0–100.0)

## 2014-12-04 NOTE — B[facility] Group Notes (Signed)
[facility] Note  12/04/2014 8:29 AM  Participation Quality:  Did not attend  Affect:  N/A  Cognitive:  N/A  Insight:  N/A  Engagement in Group:  N/A  Modes of Intervention:  N/A  Summary of Progress/Problems: Pt did not attend this group.  Beryl MeagerIngle, Romilda Proby T 12/04/2014, 8:29 AM

## 2014-12-04 NOTE — Plan of Care (Signed)
Problem: Alteration in mood Goal: LTG-Pt's behavior demonstrates decreased signs of depression (Patient's behavior demonstrates decreased signs of depression to the point the patient is safe to return home and continue treatment in an outpatient setting)  Outcome: Progressing Pt making plans for after discharge to make new friends and get involved in activites.
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
## 2014-12-04 NOTE — Plan of Care (Signed)
Problem: Alteration in mood Goal: LTG-Pt's behavior demonstrates decreased signs of depression (Patient's behavior demonstrates decreased signs of depression to the point the patient is safe to return home and continue treatment in an outpatient setting)  Outcome: Progressing Pt able to make plans for herself after discharge, wanting to make new friends and enjoy her hobbies.
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
## 2014-12-04 NOTE — Plan of Care (Signed)
Problem: Alteration in mood Goal: LTG-Patient reports reduction in suicidal thoughts (Patient reports reduction in suicidal thoughts and is able to verbalize a safety plan for whenever patient is feeling suicidal)  Outcome: Progressing Currently denies suicidal thoughts. No attempts to harm self or others.

## 2014-12-04 NOTE — [facility] Group Notes (Signed)
[facility] Group Notes:  (Nursing/MHT/Case Management/Adjunct)  Date:  12/04/2014  Time:  12:00 PM  Type of Therapy:  Group Therapy  Participation Level:  Active  Participation Quality:  Appropriate, Attentive and Supportive  Affect:  Appropriate and Flat  Cognitive:  Oriented  Insight:  Appropriate  Engagement in Group:  Engaged  Modes of Intervention:  Discussion and Education  Summary of Progress/Problems:  [name] 12/04/2014, 12:00 PM

## 2014-12-04 NOTE — [facility] Group Notes (Signed)
[facility] Group Notes:  (Nursing/MHT/Case Management/Adjunct)  Date:  12/04/2014  Time:  9:20 PM  Type of Therapy:  Group Therapy  Participation Level:  Active  Participation Quality:  Appropriate  Affect:  Appropriate  Cognitive:  Appropriate  Insight:  Appropriate  Engagement in Group:  Engaged  Modes of Intervention:  Education  Summary of Progress/Problems:  Mary MunroeClarence Melvin Gwynne Harper 12/04/2014, 9:20 PM

## 2014-12-04 NOTE — Plan of Care (Signed)
Problem: [facility] Participation in Recreation Therapeutic Interventions Goal: STG-Patient will demonstrate improved self esteem by identif STG: Self-Esteem - Within 8 treatment sessions, patient will verbalize at least 5 positive affirmation statements in each of 5 treatment sessions to increase self-esteem post d/c.  Outcome: Progressing Treatment session 1: At approximately 9:55 am, LRT met with patient in day room. Patient verbalized 6 positive affirmation statements. Patient reported it felt "good". LRT encouraged patient to continue saying positive affirmation statements.  Leonette Monarch, LRT/CTRS 05.03.16. 12:00 pm Goal: STG-Patient will identify at least five coping skills for ** STG: Coping Skills - Within 3 treatment sessions, patient will verbalize at least 5 coping skills for substance abuse in each of 2 treatment sessions to decrease substance abuse post d/c.  Outcome: Progressing Treatment session 1: At approximately 9:55 am, LRT met with patient in day room. Patient verbalized 5 healthy coping skills for substance abuse. LRT encouraged patient to put leisure activities back into her schedule.  Leonette Monarch, LRT/CTRS 05.03.16 12:02 pm

## 2014-12-04 NOTE — Progress Notes (Signed)
Pt is cooperative with unit routine.  Visible in the milieu. Attended group. Denies si/hi or avh. No attempts to harm self or others. Pleasant upon approach. Verbalizes readiness for discharge tomorrow. States "I'm ready. Now I have to get ready to move." Compliant with hs medications. Prazosin 1mg  not given. BP 90/58. No s/s of distress. Will cont to monitor and provide support.

## 2014-12-04 NOTE — [facility] Group Notes (Signed)
Adult Psychoeducational Group Note  Date:  12/04/2014 Time:  9:17 AM  Group Topic/Focus:  Overcoming Stress:   The focus of this group is to define stress and help patients assess their triggers.  Participation Level:  Active  Participation Quality:  Appropriate  Affect:  Depressed  Cognitive:  Appropriate  Insight: Improving  Engagement in Group:  Engaged  Modes of Intervention:  Socialization  Additional Comments:  CSW introduced self and read group rules. CSW asked group members to introduce themselves and share their "Dream Vacation" destination. CSW introduced group topic of "Overcoming Obstacles" and facilitated a group discussion.  Pt introduced herself and shared that her vacation spot would be "[place] to [place] all of [place]". Pt participated in group appropriately sharing her stressors as not having any family support and feeling alone. Pt also shared that transportation and income are also stressors in her life. Pt was able to receive supportive feedback from other group members who have struggled with depression.  Beryl MeagerIngle, Marirose Deveney T 12/04/2014, 9:17 AM

## 2014-12-04 NOTE — Progress Notes (Signed)
Recreation Therapy Notes  Date: 05.03.16 Time: 3:00 pm Location: Craft Room  Group Topic: Self-esteem  Goal Area(s) Addresses:  Patient will identify positive attributes about self. Patient will identify at least one coping skill.  Behavioral Response: Attentive, Interactive  Intervention: All About Me  Activity: Patients were instructed to make an All About Me pamphlet including positive traits about themselves, healthy coping skills, and their healthy support system.  Education: LRT educated patient on self-esteem, coping skills, and healthy support systems  Education Outcome: Acknowledges education/In group clarification offered  Clinical Observations/Feedback: Patient completed activity by listing positive traits about herself, healthy coping skills, and her healthy support system. Patient contributed to group discussion by stating that was easier for her to state positive traits today than it would have a couple days ago and what affects her self-esteem.    Jacquelynn CreeGreene,Darielle Hancher M, LRT/CTRS 12/04/2014 4:11 PM

## 2014-12-05 MED ORDER — HYDROXYZINE HCL 50 MG PO TABS: 50.0000 mg | ORAL_TABLET | Freq: Three times a day (TID) | ORAL | Status: DC | PRN

## 2014-12-05 MED ORDER — CYANOCOBALAMIN 1000 MCG/ML IJ SOLN: 1000.0000 ug | INTRAMUSCULAR | Status: DC

## 2014-12-05 MED ORDER — CYCLOBENZAPRINE HCL 5 MG PO TABS: 5.0000 mg | ORAL_TABLET | Freq: Three times a day (TID) | ORAL | Status: DC | PRN

## 2014-12-05 MED ORDER — BUPROPION HCL 75 MG PO TABS: 75.0000 mg | ORAL_TABLET | Freq: Two times a day (BID) | ORAL | Status: DC

## 2014-12-05 MED ORDER — DIVALPROEX SODIUM 250 MG PO DR TAB: 750.0000 mg | DELAYED_RELEASE_TABLET | Freq: Two times a day (BID) | ORAL | Status: DC

## 2014-12-05 MED ORDER — TRAZODONE HCL 150 MG PO TABS: 150.0000 mg | ORAL_TABLET | Freq: Every day | ORAL | Status: DC

## 2014-12-05 MED ORDER — PRAZOSIN HCL 1 MG PO CAPS: 1.0000 mg | ORAL_CAPSULE | Freq: Every day | ORAL | Status: DC
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
## 2014-12-05 NOTE — Progress Notes (Signed)
Patient is aware of discharge this shift. Patient denies suicidal ideations and homicidal ideations. Patient returning to motel for couple of days  with expectation of finding placement with ACT Team help. Patient received all belongings and personal items. Review of discharge instructions explained to patient. Verbalized understanding . Patient voice no other concerns. Patient leaving with ACT Team Support

## 2014-12-05 NOTE — Tx Team (Addendum)
Interdisciplinary Treatment Plan Update (Adult)  Date:  12/05/2014 Time Reviewed:  10:27 AM  Progress in Treatment: Attending groups: Yes. Participating in groups:  Yes. Taking medication as prescribed:  Yes. Tolerating medication:  Yes. Family/Significant othe contact made:  Yes, individual(s) contacted:  Act team provider Mary Harper and Pts guardian Mary Harper Patient understands diagnosis:  Yes. Discussing patient identified problems/goals with staff:  Yes. Medical problems stabilized or resolved:  Yes. Denies suicidal/homicidal ideation: Yes. Issues/concerns per patient self-inventory:  No. Other:  New problem(s) identified: No, Describe:  Patient is presenting well and has participated in setting short term goals.  Discharge Plan or Barriers:Patient will follow up with Psychiatrist Mary Harper with [name] and will also attend a walk in clinic assessment for at [facility] on Tuesday May 10th between 9-12.   Reason for Continuation of Hospitalization: None   Comments:  Estimated length of stay: 0  New goal(s): With guardian support and Act team patient will find suitable supportive housing on her own. Several addition community resources were provided to patient  Review of initial/current patient goals per problem list:  Reconciled   Attendees: Patient:  Mary Harper 5/4/201610:27 AM  Family:   5/4/201610:27 AM  Physician:  Mary Kathrene BongoA Hernandez 5/4/201610:27 AM  Nursing:    5/4/201610:27 AM  Case Manager:   5/4/201610:27 AM  Counselor:   5/4/201610:27 AM  Other:  Mary Harper 5/4/201610:27 AM  Other:   5/4/201610:27 AM  Other:   5/4/201610:27 AM  Other:  5/4/201610:27 AM  Other:  5/4/201610:27 AM  Other:  5/4/201610:27 AM  Other:  5/4/201610:27 AM  Other:  5/4/201610:27 AM  Other:  5/4/201610:27 AM  Other:   5/4/201610:27 AM   Scribe for Treatment Team:   Mary Harper, Mary Harper, 12/05/2014, 10:27 AM

## 2014-12-05 NOTE — Progress Notes (Signed)
Recreation Therapy Notes  INPATIENT RECREATION TR PLAN  Patient Details Name: Candace Pinera MRN: [id number] DOB: 10/06/1965 Today's Date: 12/05/2014  Rec Therapy Plan Is patient appropriate for Therapeutic Recreation?: Yes Treatment times per week: At least 3 times a week TR Treatment/Interventions: 1:1 session, Group participation (Comment) (Appropriate participation in daily recreation therapy tx)  Discharge Criteria Pt will be discharged from therapy if:: Discharged Treatment plan/goals/alternatives discussed and agreed upon by:: Patient/family  Discharge Summary Short term goals set: Within 8 treatment sessions, patient will verbalized at least 5 positive affirmation statements in each of 5 treatment sessions to increase self-esteem post d/c. (Within 3 treatment sessions, patient will verbalize 5 healthy coping skills for substance abuse in each of 2 treatment sessions to decrease substance abuse post d/c. (Goal Met)) Short term goals met: Adequate for discharge Progress toward goals comments: Groups attended (One-to-one attended) Which groups?: Self-esteem, Communication, Social skills, Leisure education, Goal setting, Coping skills One-to-one attended: Self-esteem and stress management Reason goals not met: Patient planning on d/c before 1 goal could be met Therapeutic equipment acquired: None Reason patient discharged from therapy: Discharge from hospital Pt/family agrees with progress & goals achieved: Yes Date patient discharged from therapy: 12/05/14    M , LRT/CTRS 12/05/2014, 12:00 PM  

## 2014-12-05 NOTE — [facility] Group Notes (Signed)
[facility] Planning [facility] Note  12/05/2014 10:51 AM  Participation Quality:  Appropriate and Attentive  Affect:  Appropriate  Cognitive:  Appropriate  Insight:  Engaged  Engagement in Group:  Engaged  Modes of Intervention:  Discussion, Problem-solving and Socialization  Summary of Progress/Problems: CSW shared with group BM[facility] staff roles and explained LOS and discharge process to group members. Pt received a workbook for Wednesday on topic of "Personal Development". Pt shared that her goal is to "get discharged today, regroup and get with my supports to make the best decision for what happens next in my life".    Beryl MeagerIngle, Thurman Sarver T 12/05/2014, 10:51 AM

## 2014-12-05 NOTE — Progress Notes (Signed)
  [facility] Adult Case Management Discharge Plan :  Will you be returning to the same living situation after discharge:  No. At discharge, do you have transportation home?: Yes,  Mr Owens Shark from [name] [phone] Do you have the ability to pay for your medications: Yes,  Medicare  Release of information consent forms completed and in the chart;  Patient's signature needed at discharge.  Patient to Follow up at: [facility] [address], [place], [place] [address] [phone] Fax [phone]   Patient denies SI/HI: Yes,  Patient reports she is not having suicidal/homocidal thoughts or experiencing audio or visual hallucintations    Safety Planning and Suicide Prevention discussed: Yes,  LCSW met with patient and reviewed her safe discharge plans. Her plan is to connect with her ACT team worker Mr Owens Shark and will stay at a motel in [place] near her guardian for the next 2 days. She has an apartment held at [employer]. She will make arrangements with the sherrifs department to obtain her personal effects from previous apartment and relocate them to her new apartment. Her Guardian Mr Barkley Boards has put adequate funds into her bank account [phone] to provide 1st and last months rent and temporary shelter costs until new apartment is ready. LCSW in addition reviewed resources such as the womens shelter through [facility] as a last resort for safe supported housing/[facility]. She will go to walk in clinic from 9-12pm on Tuesday at [facility] in [place] for an assessment and treatment followup. She will be supported by [name] ACT team.      Has patient been referred to the Quitline?: Patient refused referral  Enis Slipper M 12/05/2014, 8:05 AM

## 2014-12-05 NOTE — Progress Notes (Signed)
Mr Mary Harper called back and stated a worker will come by at 1pm to pick up patient.LCSW reviewed her safe discharge plans. Her plan is to connect with her ACT team worker Mr Mary Harper and will stay at a motel in [place] near her guardian for the next 2 days. She has an apartment held at [address]. She will make arrangements with the sherrifs department to obtain her personal effects from previous apartment and relocate them to her new apartment. Her Guardian Mr Mary Harper has put adequate funds into her bank account [phone] to provide 1st and last months rent and temporary shelter costs until new apartment is ready. LCSW in addition reviewed resources such as the [facility]ens [facility] as a last resort for safe supported housing/[facility]. She will go to walk in clinic from 9-12pm on Tuesday at [facility] in [place] for an assessment and treatment followup. She will be supported by [name] ACT team.

## 2014-12-05 NOTE — Plan of Care (Signed)
Problem: [facility] Participation in Recreation Therapeutic Interventions Goal: STG-Patient will demonstrate improved self esteem by identif STG: Self-Esteem - Within 8 treatment sessions, patient will verbalize at least 5 positive affirmation statements in each of 5 treatment sessions to increase self-esteem post d/c.  Outcome: Adequate for Discharge Treatment session 2: At approximately 11:30 am, LRT met with patient in craft room. Patient verbalized 6 positive affirmation statements. Patient reported it felt "good". Patient reported she felt better about herself today than when she was first admitted and she thought the positive affirmation statements helped. LRT encouraged patient to continue saying positive affirmation statements.  Leonette Monarch, LRT/CTRS 05.04.16 11:51 am Goal: STG-Patient will identify at least five coping skills for ** STG: Coping Skills - Within 3 treatment sessions, patient will verbalize at least 5 coping skills for substance abuse in each of 2 treatment sessions to decrease substance abuse post d/c.  Outcome: Completed/Met Date Met:  12/05/14 Treatment session 2: At approximately 11:30 am, LRT met with patient in craft room. Patient verbalized 6 healthy coping skills for substance abuse. LRT encouraged patient to use her schedule to help her put leisure activities back into her schedule.  Leonette Monarch, LRT/CTRS 05.04.16 11:53 am

## 2014-12-05 NOTE — [facility] Group Notes (Signed)
[facility] Group Notes:  (Nursing/MHT/Case Management/Adjunct)  Date:  12/05/2014  Time:  12:50 PM  Type of Therapy:  Group Therapy  Participation Level:  Active  Participation Quality:  Appropriate  Affect:  Appropriate  Cognitive:  Appropriate  Insight:  Appropriate  Engagement in Group:  Engaged and Supportive  Modes of Intervention:  Socialization  Summary of Progress/Problems:  Marquette Oldmanda Lea Campbell 12/05/2014, 12:50 PM

## 2014-12-05 NOTE — [facility] Group Notes (Signed)
Adult Psychoeducational Group Note  Date:  12/05/2014 Time:  8:45 AM  Group Topic/Focus:  Diagnosis Education:   The focus of this group is to discuss the major disorders that patients maybe diagnosed with.  Group discusses the importance of knowing what one's diagnosis is so that one can understand treatment and better advocate for oneself.  Participation Level:  Active  Participation Quality:  Appropriate  Affect:  Appropriate and Depressed  Cognitive:  Appropriate  Insight: Good  Engagement in Group:  Engaged  Modes of Intervention:  Discussion, Problem-solving and Socialization  Additional Comments:  Pt was able to share emotions concerning challenges in her life and received feedback from other group members.  Beryl MeagerIngle, Jordy Verba T 12/05/2014, 8:45 AM

## 2014-12-05 NOTE — Progress Notes (Signed)
LCSW met with patient and reviewed her safe discharge plans. Her plan is to connect with her ACT team worker Mary Harper and will stay at a motel in [place] near her guardian for the next 2 days. She has an apartment held at [employer]. She will make arrangements with the sherrifs department to obtain her personal effects from previous apartment and relocate them to her new apartment. Her Guardian Mary Harper has put adequate funds into her bank account [phone] to provide 1st and last months rent and temporary shelter costs until new apartment is ready. LCSW in addition reviewed resources such as the womens shelter through [facility] as a last resort for safe supported housing/[facility]. She will go to walk in clinic from 9-12pm on Tuesday at [facility] in [place] for an assessment and treatment followup. She will be supported by [name] ACT team.   Mary Harper from [name] [phone]  was left 2 messages to contact and pick up patient. Her guardian Mary Harper is unable to pick her up as he is working 2 shifts today.

## 2015-03-10 ENCOUNTER — Encounter (HOSPITAL_COMMUNITY): Payer: Self-pay

## 2015-03-10 ENCOUNTER — Emergency Department (HOSPITAL_COMMUNITY)
Admission: EM | Admit: 2015-03-10 | Discharge: 2015-03-11 | Disposition: A | Discharge status: Other Acute Inpatient Hospital | Payer: Medicare Other | Attending: Emergency Medicine | Admitting: Emergency Medicine

## 2015-03-10 DIAGNOSIS — R45851 Suicidal ideations: Secondary | ICD-10-CM | POA: Diagnosis not present

## 2015-03-10 DIAGNOSIS — T5491XA Toxic effect of unspecified corrosive substance, accidental (unintentional), initial encounter: Secondary | ICD-10-CM

## 2015-03-10 DIAGNOSIS — Y929 Unspecified place or not applicable: Secondary | ICD-10-CM | POA: Insufficient documentation

## 2015-03-10 DIAGNOSIS — Z7952 Long term (current) use of systemic steroids: Secondary | ICD-10-CM | POA: Insufficient documentation

## 2015-03-10 DIAGNOSIS — F314 Bipolar disorder, current episode depressed, severe, without psychotic features: Secondary | ICD-10-CM | POA: Diagnosis present

## 2015-03-10 DIAGNOSIS — Y998 Other external cause status: Secondary | ICD-10-CM | POA: Insufficient documentation

## 2015-03-10 DIAGNOSIS — T543X4A Toxic effect of corrosive alkalis and alkali-like substances, undetermined, initial encounter: Secondary | ICD-10-CM | POA: Insufficient documentation

## 2015-03-10 DIAGNOSIS — Z036 Encounter for observation for suspected toxic effect from ingested substance ruled out: Secondary | ICD-10-CM | POA: Diagnosis present

## 2015-03-10 DIAGNOSIS — Z9884 Bariatric surgery status: Secondary | ICD-10-CM | POA: Insufficient documentation

## 2015-03-10 DIAGNOSIS — Y939 Activity, unspecified: Secondary | ICD-10-CM | POA: Insufficient documentation

## 2015-03-10 DIAGNOSIS — F419 Anxiety disorder, unspecified: Secondary | ICD-10-CM | POA: Diagnosis not present

## 2015-03-10 DIAGNOSIS — F1020 Alcohol dependence, uncomplicated: Secondary | ICD-10-CM | POA: Diagnosis present

## 2015-03-10 DIAGNOSIS — Z8669 Personal history of other diseases of the nervous system and sense organs: Secondary | ICD-10-CM | POA: Diagnosis not present

## 2015-03-10 LAB — COMPREHENSIVE METABOLIC PANEL
ALK PHOS: 51 U/L (ref 38–126)
ALT: 13 U/L — AB (ref 14–54)
AST: 26 U/L (ref 15–41)
Albumin: 3.1 g/dL — ABNORMAL LOW (ref 3.5–5.0)
Anion gap: 9 (ref 5–15)
BUN: 16 mg/dL (ref 6–20)
CHLORIDE: 105 mmol/L (ref 101–111)
CO2: 21 mmol/L — AB (ref 22–32)
CREATININE: 0.77 mg/dL (ref 0.44–1.00)
Calcium: 7.3 mg/dL — ABNORMAL LOW (ref 8.9–10.3)
GFR calc non Af Amer: >60 mL/min (ref >60)
GLUCOSE: 117 mg/dL — AB (ref 65–99)
POTASSIUM: 3.6 mmol/L (ref 3.5–5.1)
Sodium: 135 mmol/L (ref 135–145)
TOTAL PROTEIN: 5.8 g/dL — AB (ref 6.5–8.1)
Total Bilirubin: 0.5 mg/dL (ref 0.3–1.2)

## 2015-03-10 LAB — CBC
HCT: 34.9 % — ABNORMAL LOW (ref 36.0–46.0)
Hemoglobin: 11.4 g/dL — ABNORMAL LOW (ref 12.0–15.0)
MCH: 31.8 pg (ref 26.0–34.0)
MCHC: 32.7 g/dL (ref 30.0–36.0)
MCV: 97.5 fL (ref 78.0–100.0)
Platelets: 197 10*3/uL (ref 150–400)
RBC: 3.58 MIL/uL — AB (ref 3.87–5.11)
RDW: 13.3 % (ref 11.5–15.5)
WBC: 6.5 10*3/uL (ref 4.0–10.5)

## 2015-03-10 LAB — CBG MONITORING, ED
GLUCOSE-CAPILLARY: 96 mg/dL (ref 65–99)
Glucose-Capillary: 112 mg/dL — ABNORMAL HIGH (ref 65–99)

## 2015-03-10 LAB — ACETAMINOPHEN LEVEL: Acetaminophen (Tylenol), Serum: <10 ug/mL — ABNORMAL LOW (ref 10–30)

## 2015-03-10 LAB — SALICYLATE LEVEL: Salicylate Lvl: <4 mg/dL (ref 2.8–30.0)

## 2015-03-10 LAB — ETHANOL: ALCOHOL ETHYL (B): 177 mg/dL — AB (ref <5)

## 2015-03-10 MED ORDER — SODIUM CHLORIDE 0.9 % IV SOLN
Freq: Once | INTRAVENOUS | Status: AC
  Administered 2015-03-10: 20:00:00 via INTRAVENOUS

## 2015-03-10 MED ORDER — SODIUM CHLORIDE 0.9 % IV BOLUS (SEPSIS)
1000.0000 mL | Freq: Once | INTRAVENOUS | Status: AC
  Administered 2015-03-10: 1000 mL via INTRAVENOUS
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
## 2015-03-10 NOTE — ED Notes (Signed)
Currently she is a resident of a local women's shelter.  Another female who resides at the shelter found pt. To be "unresponsive" and EMS were called.  Pt. Arrives here weeping and in no distress.  EMS found pt. To be normotensive en route to hospital.  Monitor shows nsr without ectopy.  Her skin is normal, warm and dry and she is breathing normally.  EMS reports pt. Stating she took several (unknown quantity) of Prazosin  tablets "to kill myself".  She also reported drinking much alcohol for past 24 hours.

## 2015-03-10 NOTE — ED Notes (Signed)
Bed: RESA Expected date:  Expected time:  Means of arrival:  Comments: OD/SI

## 2015-03-11 ENCOUNTER — Encounter (HOSPITAL_COMMUNITY): Payer: Self-pay | Admitting: *Deleted

## 2015-03-11 ENCOUNTER — Inpatient Hospital Stay (HOSPITAL_COMMUNITY)
Admission: AD | Admit: 2015-03-11 | Discharge: 2015-03-21 | DRG: 885 | Disposition: A | Discharge status: Home / Self Care | Payer: Medicare Other | Source: Intra-hospital | Attending: Psychiatry | Admitting: Psychiatry

## 2015-03-11 DIAGNOSIS — G4700 Insomnia, unspecified: Secondary | ICD-10-CM | POA: Diagnosis present

## 2015-03-11 DIAGNOSIS — F410 Panic disorder [episodic paroxysmal anxiety] without agoraphobia: Secondary | ICD-10-CM | POA: Diagnosis present

## 2015-03-11 DIAGNOSIS — Y906 Blood alcohol level of 120-199 mg/100 ml: Secondary | ICD-10-CM | POA: Diagnosis present

## 2015-03-11 DIAGNOSIS — T543X4A Toxic effect of corrosive alkalis and alkali-like substances, undetermined, initial encounter: Secondary | ICD-10-CM | POA: Diagnosis not present

## 2015-03-11 DIAGNOSIS — F4310 Post-traumatic stress disorder, unspecified: Secondary | ICD-10-CM | POA: Diagnosis present

## 2015-03-11 DIAGNOSIS — F314 Bipolar disorder, current episode depressed, severe, without psychotic features: Secondary | ICD-10-CM

## 2015-03-11 DIAGNOSIS — Z9884 Bariatric surgery status: Secondary | ICD-10-CM

## 2015-03-11 DIAGNOSIS — F1020 Alcohol dependence, uncomplicated: Secondary | ICD-10-CM | POA: Diagnosis not present

## 2015-03-11 DIAGNOSIS — F10229 Alcohol dependence with intoxication, unspecified: Secondary | ICD-10-CM | POA: Diagnosis present

## 2015-03-11 DIAGNOSIS — R45851 Suicidal ideations: Secondary | ICD-10-CM | POA: Diagnosis present

## 2015-03-11 LAB — RAPID URINE DRUG SCREEN, HOSP PERFORMED
Amphetamines: NOT DETECTED
BENZODIAZEPINES: NOT DETECTED
Barbiturates: NOT DETECTED
Cocaine: NOT DETECTED
Opiates: NOT DETECTED
TETRAHYDROCANNABINOL: NOT DETECTED

## 2015-03-11 LAB — CBG MONITORING, ED
GLUCOSE-CAPILLARY: 81 mg/dL (ref 65–99)
GLUCOSE-CAPILLARY: 154 mg/dL — AB (ref 65–99)

## 2015-03-11 MED ORDER — FLUOXETINE HCL 20 MG PO CAPS
20.0000 mg | ORAL_CAPSULE | Freq: Every day | ORAL | Status: DC
  Administered 2015-03-12: 20 mg via ORAL
  Filled 2015-03-11 (×3): qty 1

## 2015-03-11 MED ORDER — ARIPIPRAZOLE 5 MG PO TABS
5.0000 mg | ORAL_TABLET | Freq: Every day | ORAL | Status: DC
  Administered 2015-03-11: 5 mg via ORAL
  Filled 2015-03-11: qty 1

## 2015-03-11 MED ORDER — GABAPENTIN 300 MG PO CAPS: 300.0000 mg | ORAL_CAPSULE | Freq: Three times a day (TID) | ORAL | Status: DC

## 2015-03-11 MED ORDER — TRAZODONE HCL 50 MG PO TABS: 50.0000 mg | ORAL_TABLET | Freq: Every evening | ORAL | Status: DC | PRN

## 2015-03-11 MED ORDER — FLUOXETINE HCL 20 MG PO CAPS
20.0000 mg | ORAL_CAPSULE | Freq: Every day | ORAL | Status: DC
  Administered 2015-03-11: 20 mg via ORAL
  Filled 2015-03-11: qty 1

## 2015-03-11 MED ORDER — ALUM & MAG HYDROXIDE-SIMETH 200-200-20 MG/5ML PO SUSP: 30.0000 mL | ORAL | Status: DC | PRN

## 2015-03-11 MED ORDER — LORAZEPAM 1 MG PO TABS: 1.0000 mg | ORAL_TABLET | Freq: Four times a day (QID) | ORAL | Status: DC | PRN

## 2015-03-11 MED ORDER — LITHIUM CITRATE 300 MG/5 ML PO SYRP
600.0000 mg | Freq: Every day | ORAL | Status: DC
  Filled 2015-03-11 (×2): qty 10

## 2015-03-11 MED ORDER — LITHIUM CITRATE 300 MG/5 ML PO SYRP
600.0000 mg | Freq: Every day | ORAL | Status: DC
  Filled 2015-03-11: qty 10

## 2015-03-11 MED ORDER — GABAPENTIN 100 MG PO CAPS
100.0000 mg | ORAL_CAPSULE | Freq: Three times a day (TID) | ORAL | Status: DC
  Administered 2015-03-11: 100 mg via ORAL
  Filled 2015-03-11: qty 1

## 2015-03-11 MED ORDER — GABAPENTIN 100 MG PO CAPS
100.0000 mg | ORAL_CAPSULE | Freq: Three times a day (TID) | ORAL | Status: DC
  Administered 2015-03-11 – 2015-03-12 (×2): 100 mg via ORAL
  Filled 2015-03-11 (×7): qty 1

## 2015-03-11 MED ORDER — ACETAMINOPHEN 325 MG PO TABS
650.0000 mg | ORAL_TABLET | Freq: Four times a day (QID) | ORAL | Status: DC | PRN
  Administered 2015-03-13 – 2015-03-15 (×2): 650 mg via ORAL
  Filled 2015-03-11 (×2): qty 2

## 2015-03-11 MED ORDER — ARIPIPRAZOLE 5 MG PO TABS
5.0000 mg | ORAL_TABLET | Freq: Every day | ORAL | Status: DC
  Administered 2015-03-12: 5 mg via ORAL
  Filled 2015-03-11 (×3): qty 1

## 2015-03-11 MED ORDER — LORAZEPAM 1 MG PO TABS
1.0000 mg | ORAL_TABLET | Freq: Once | ORAL | Status: AC
  Administered 2015-03-11: 1 mg via ORAL
  Filled 2015-03-11: qty 1

## 2015-03-11 MED ORDER — MAGNESIUM HYDROXIDE 400 MG/5ML PO SUSP: 30.0000 mL | Freq: Every day | ORAL | Status: DC | PRN

## 2015-03-11 MED ORDER — ACETAMINOPHEN 325 MG PO TABS
650.0000 mg | ORAL_TABLET | Freq: Once | ORAL | Status: AC
  Administered 2015-03-11: 650 mg via ORAL
  Filled 2015-03-11: qty 2

## 2015-03-11 MED ORDER — LORAZEPAM 1 MG PO TABS
1.0000 mg | ORAL_TABLET | Freq: Four times a day (QID) | ORAL | Status: DC | PRN
  Administered 2015-03-11 – 2015-03-12 (×3): 1 mg via ORAL
  Filled 2015-03-11 (×3): qty 1

## 2015-03-11 MED ORDER — LITHIUM CITRATE 300 MG/5 ML PO SYRP: 900.0000 mg | Freq: Every day | ORAL | Status: DC

## 2015-03-11 NOTE — Progress Notes (Addendum)
Patient in bed all evening. Writer went to patient's room to assess him. She appeared tired and depressed. Writer asked patient if she will take Lithium pill as oppose to the syrup that was ordered. Patient sated; " I don't take Lithium anymore". She also received any sleep medication; "I'm sleepy". She denied SI/HI and denied Hallucinations. Q 15 minute check continues as ordered for safety.
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
## 2015-03-11 NOTE — ED Provider Notes (Signed)
2:45 AM  Pt hemodynamically stable after overdose. Accepted to behavioral health hospital. Will have bed in the morning. At this time patient is voluntary.  Layla Maw Ward, DO 03/11/15 [phone]
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
## 2015-03-11 NOTE — Progress Notes (Signed)
Adult Psychoeducational Group Note  Date:  03/11/2015 Time:  8:23 PM  Group Topic/Focus:  Wrap-Up Group:   The focus of this group is to help patients review their daily goal of treatment and discuss progress on daily workbooks.  Participation Level:  Did Not Attend  Additional Comments:  Pt was in bed at the time of group. Pt was invited to come to group, however stayed in her room in bed at the time of group.  Caswell Corwin 03/11/2015, 8:23 PM
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
## 2015-03-11 NOTE — ED Notes (Addendum)
Gina for poison control. For widened QTC: check mag and potassium. For widened QRS bicarb bolus. Repeat EKG [id number].

## 2015-03-11 NOTE — BH Assessment (Signed)
Received notification of TTS consult request. Spoke to Rolland Porter, MD who said is intoxicated and overdosed on blood pressure medication in suicide attempt. Tele-assessment will be initiated.  Harlin Rain Patsy Baltimore, LPC, [facility], [facility] Triage Specialist [phone]
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
## 2015-03-11 NOTE — Progress Notes (Signed)
Patient ID: Mary Harper, female   DOB: 07-Aug-1965, 49 y.o.   MRN: [id number] PER STATE REGULATIONS 482.30  THIS CHART WAS REVIEWED FOR MEDICAL NECESSITY WITH RESPECT TO THE PATIENT'S ADMISSION/DURATION OF STAY.  NEXT REVIEW DATE:03/15/15  Loura Halt, RN, BSN CASE MANAGER

## 2015-03-12 ENCOUNTER — Encounter (HOSPITAL_COMMUNITY): Payer: Self-pay | Admitting: Psychiatry

## 2015-03-12 DIAGNOSIS — F4310 Post-traumatic stress disorder, unspecified: Secondary | ICD-10-CM

## 2015-03-12 MED ORDER — BUPROPION HCL ER (XL) 150 MG PO TB24
150.0000 mg | ORAL_TABLET | Freq: Every day | ORAL | Status: DC
  Administered 2015-03-12 – 2015-03-15 (×4): 150 mg via ORAL
  Filled 2015-03-12 (×7): qty 1

## 2015-03-12 MED ORDER — HYDROXYZINE HCL 50 MG PO TABS
50.0000 mg | ORAL_TABLET | Freq: Four times a day (QID) | ORAL | Status: DC | PRN
  Filled 2015-03-12: qty 10

## 2015-03-12 MED ORDER — TRAZODONE HCL 150 MG PO TABS
150.0000 mg | ORAL_TABLET | Freq: Every day | ORAL | Status: DC
  Administered 2015-03-12 – 2015-03-20 (×9): 150 mg via ORAL
  Filled 2015-03-12 (×10): qty 1
  Filled 2015-03-12 (×2): qty 3
  Filled 2015-03-12: qty 1

## 2015-03-12 MED ORDER — DIVALPROEX SODIUM ER 250 MG PO TB24
750.0000 mg | ORAL_TABLET | Freq: Two times a day (BID) | ORAL | Status: DC
  Administered 2015-03-12 – 2015-03-21 (×18): 750 mg via ORAL
  Filled 2015-03-12 (×9): qty 3
  Filled 2015-03-12: qty 18
  Filled 2015-03-12: qty 3
  Filled 2015-03-12: qty 18
  Filled 2015-03-12 (×3): qty 3
  Filled 2015-03-12: qty 18
  Filled 2015-03-12 (×4): qty 3
  Filled 2015-03-12: qty 18
  Filled 2015-03-12 (×7): qty 3

## 2015-03-12 NOTE — [facility] Group Notes (Signed)
[facility] Group Notes: (Nursing/MHT/Case Management/Adjunct)  Date: 03/12/2015  Time: 10:24 AM  Type of Therapy: Group Therapy  Participation Level: Did Not Attend  Participation Quality: None  Affect: None  Cognitive: None  Insight: None  Engagement in Group: None-Came to day room prior to group starting with the intention of attending group but was pulled out to meet with the doctor.  Modes of Intervention: Discussion and education  Summary of Progress/Problems: Patient invited to group and chose not to attend.  Norm Parcel Faustino Luecke 03/12/2015, 10:24 AM

## 2015-03-12 NOTE — Progress Notes (Signed)
Pt attended the evening AA speaker meeting. 
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
## 2015-03-12 NOTE — Clinical Social Work Note (Signed)
Voicemail left for pt's social worker Laqueta Carina: [phone]) through [facility] of the [place] to inquire about [facility] and to touch base with her regarding patient. CSW left voicemail for pt's guardian Nadine Counts Heilig: [phone]) to inform him of pt's hospitalization and touch base. CSW requested call back at his earliest convenience.  Trula Slade, LCSWA Clinical Social Worker 03/12/2015 12:43 PM
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
## 2015-03-12 NOTE — Progress Notes (Signed)
Recreation Therapy Notes  Animal-Assisted Activity (AAA) Program Checklist/Progress Notes Patient Eligibility Criteria Checklist & Daily Group note for Rec Tx Intervention  Date: 08.09.16 Time: 2:45 pm Location: [address] Dayroom  AAA/T Program Assumption of Risk Form signed by Patient/ or Parent Legal Guardian yes  Patient is free of allergies or sever asthma yes  Patient reports no fear of animals yes  Patient reports no history of cruelty to animalsyes  Patient understands his/her participation is voluntary yes  Patient washes hands before animal contact yes  Patient washes hands after animal contact yes  Education: Hand Washing, Appropriate Animal Interaction   Education Outcome: Acknowledges understanding/In group clarification offered/Needs additional education.   Clinical Observations/Feedback: Patient did not attend group.   Mary Harper, LRT/CTRS         [name] A 03/12/2015 4:33 PM 

## 2015-03-12 NOTE — [facility] Group Notes (Signed)
[facility] LCSW Group Therapy  03/12/2015 12:47 PM  Type of Therapy:  Group Therapy  Participation Level:  Active  Participation Quality:  Attentive  Affect:  Depressed  Cognitive:  Oriented  Insight:  Limited  Engagement in Therapy:  Improving  Modes of Intervention:  Discussion, Education, Exploration, Problem-solving, Rapport Building, Socialization and Support  Summary of Progress/Problems: MHA Speaker came to talk about his personal journey with substance abuse and addiction. The pt processed ways by which to relate to the speaker. MHA speaker provided handouts and educational information pertaining to groups and services offered by the [facility].   Smart, Worley Radermacher LCSWA 03/12/2015, 12:47 PM
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
## 2015-03-12 NOTE — [facility] Suicide Risk Assessment (Signed)
[facility] INPATIENT:  Family/Significant Other Suicide Prevention Education  Suicide Prevention Education:  Contact Attempts: [name] (pt's cousin/guardian) [phone] has been identified by the patient as the family member/significant other with whom the patient will be residing, and identified as the person(s) who will aid the patient in the event of a mental health crisis.  With written consent from the patient, two attempts were made to provide suicide prevention education, prior to and/or following the patient's discharge.  We were unsuccessful in providing suicide prevention education.  A suicide education pamphlet was given to the patient to share with family/significant other.  Date and time of first attempt: 03/12/15 (vm left requesting call back at his earliest convenience).   Smart, Soloman Mckeithan LCSWA  03/12/2015, 12:44 PM   SPE completed with pt's guardian, [name]. He is concerned that she needs constant supervision but stated that he and the ACT team have not had luck getting her placed. CSW to speak with supervisor and call him back in a few days. He verbalized understanding of SPE material. SPE pamphlet also provided to Martell by CSW and reviewed in full.   Trula Slade, LCSWA Clinical Social Worker 03/12/2015 4:04 PM

## 2015-03-12 NOTE — Progress Notes (Signed)
Patient in day room with her friends at the beginning of the shift. She appeared brighter than she was yesterday. Patient denied SI/HI and denied Hallucinations. Patient attended group. She is more visible on the unit than she was the previous day; although not much interactions noted between patient and peers. She seemed brighter tonight. Patient received her HS medications without any difficulty. Q 15 minute checks continues a ordered to maintain safety.

## 2015-03-12 NOTE — Progress Notes (Signed)
D- Patient is depressed, tearful, anxious, and noticeably shakings. Patient endorses SI and states "I can't figure out how to do it here".  Patient verbally contracts for safety and states that she will notify staff if she feels like she is about to harm herself in any way but reports that she has thoughts of self harm "all the time".  Patient agreed to try and stay visible in the milieu and attend groups for safety and to not isolate herself.  Denies HI, AVH, and pain.  Patient had concerns about making sure she is taking the correct medications.  After consulting with the MD, we were able update her medications. Through patient self-inventory, patient reports poor sleep, and rates her depression and feelings of hopelessness "9" and her anxiety "8" with 10 being the worst.  A- Scheduled medications administered to patient, per MD orders. Support and encouragement provided.  Routine safety checks conducted every 15 minutes.  Patient informed to notify staff with problems or concerns. R- No adverse drug reactions noted. Patient contracts for safety at this time. Patient compliant with medications and treatment plan. Patient receptive and cooperative. Patient remains safe at this time.    1812: Patient observed having a panic attack following dinner.  She was crying, shaking, hyperventilating and vomitting. Patient's blood pressure and pulse were above patient's baseline.  Patient was coached through deep breathing techniques and was given anti-anxiety medication.  Patient was noticeably calmer and respirations had slowed.  Blood pressure and pulse were rechecked at 1820 and had shown improvement (See flow sheet for vitals).

## 2015-03-13 MED ORDER — LORAZEPAM 1 MG PO TABS
1.0000 mg | ORAL_TABLET | Freq: Four times a day (QID) | ORAL | Status: DC | PRN
  Administered 2015-03-13 – 2015-03-19 (×6): 1 mg via ORAL
  Filled 2015-03-13 (×6): qty 1

## 2015-03-13 MED ORDER — ESZOPICLONE 2 MG PO TABS: 3.0000 mg | ORAL_TABLET | Freq: Every evening | ORAL | Status: DC | PRN

## 2015-03-13 MED ORDER — ESZOPICLONE 2 MG PO TABS
3.0000 mg | ORAL_TABLET | Freq: Every day | ORAL | Status: DC
  Administered 2015-03-13 – 2015-03-20 (×8): 3 mg via ORAL
  Filled 2015-03-13 (×8): qty 2

## 2015-03-13 NOTE — [facility] Group Notes (Signed)
[facility] LCSW Aftercare Discharge Planning Group Note   03/13/2015 8:46 AM  Participation Quality:  Appropriate   Mood/Affect:  Appropriate  Depression Rating:  8  Anxiety Rating:  6  Thoughts of Suicide:  Yes  Will you contract for safety?   Yes    Current AVH:  No  Plan for Discharge/Comments:  Pt plans to return to shelter-CSW waiting to find out from her social worker if bed is still available. [name]. Pt reports mood improvement since yesterday with "less suicidal thinking."   Transportation Means: ACT?   Supports: ACT team and guardian   Mary Harper, Mary Harper
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
## 2015-03-13 NOTE — [facility] Group Notes (Signed)
[facility] LCSW Group Therapy  03/13/2015 1:15 PM  Type of Therapy:  Group Therapy  Participation Level:  Active  Participation Quality:  Attentive  Affect:  Appropriate  Cognitive:  Alert and Oriented  Insight:  Improving  Engagement in Therapy:  Improving  Modes of Intervention:  Confrontation, Discussion, Education, Exploration, Problem-solving, Rapport Building, Socialization and Support  Summary of Progress/Problems: Emotion Regulation: This group focused on both positive and negative emotion identification and allowed group members to process ways to identify feelings, regulate negative emotions, and find healthy ways to manage internal/external emotions. Group members were asked to reflect on a time when their reaction to an emotion led to a negative outcome and explored how alternative responses using emotion regulation would have benefited them. Group members were also asked to discuss a time when emotion regulation was utilized when a negative emotion was experienced.  Mary Harper was attentive and engaged during today's processing group. She explored her difficulty in managing depression and SI; and her attempts to self medicate with alcohol. Mary Harper shared that she feels like giving up at times but was thankful for being at [facility], where she stated staff are kind and caring to her. Mary Harper stated that she wants to get a handle on her depression in order to feel a sense of hope again and also to find more stable housing and to feel safe.   Smart, Mallorie Norrod LCSWA  03/13/2015, 1:15 PM

## 2015-03-13 NOTE — Clinical Social Work Note (Signed)
CSW spoke with Elease Hashimoto at [facility] and verified that pt still has bed being held for her upon discharge. Bertis Ruddy at [name] was also contacted by CSW and updated regarding patient admission.   Trula Slade, LCSWA Clinical Social Worker 03/13/2015 3:23 PM

## 2015-03-13 NOTE — Progress Notes (Signed)
D:  Per pt self inventory pt reports sleeping poor, appetite fair, energy level low, ability to pay attention poor, rates depression at an 8 out of 10, hopelessness at an 8 out of 10, anxiety at a 7 out of 10, endorses SI, verbal contract for safety, denies HI/AVH, pleasant, depressed and tearful during interaction, goal today: "getting some help with sleep, haven't slept well in weeks--told MD yesterday, talk to Dr and might have to nap"    A:  Emotional support provided, Encouraged pt to continue with treatment plan and attend all group activities, q15 min checks maintained for safety.  R:  Pt is receptive, going to some groups but needs encouragement to come out of room, interacts appropriately, pleasant and cooperative with staff and other patients on the unit.

## 2015-03-13 NOTE — Progress Notes (Signed)
During environmental checks, staff found a pillow case torn into long strips--talked with patient and patient states that she did it yesterday around 3pm and was contemplating hanging herself with it, pt endorses current SI, contracts for safety, pt became tearful and states that she feels safe here and that she will come to staff if she starts feeling like she wants to act on the thoughts or if she thinks of a plan, Charge RN, MD notified, no new orders given at this time, pt returned to am group with social worker, pillow case was removed from her room and disposed of by staff.

## 2015-03-13 NOTE — Progress Notes (Signed)
Recreation Therapy Notes  Date: 08.10.16 Time: 930 am Location: [address] Group Room  Group Topic: Stress Management  Goal Area(s) Addresses:  Patient will verbalize importance of using healthy stress management.  Patient will identify positive emotions associated with healthy stress management.   Behavioral Response:  Engaged  Intervention: Stress Management  Activity : Progressive Muscle Relaxation. LRT will introduce and instruct patients on the stress management technique of progressive muscle relaxation. Patients were asked to follow Harper long with Harper script read Harper loud by LRT to participate in the stress management technique of progressive muscle relaxation.  Education: Stress Management, Discharge Planning.   Education Outcome: Acknowledges edcuation/In group clarification offered/Needs additional education  Clinical Observations/Feedback: Patient attended the group.    Mary Harper, LRT/CTRS         Mary Harper 03/13/2015 3:43 PM 
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
## 2015-03-13 NOTE — Progress Notes (Signed)
Pt attended NA speaker meeting and was engaged and attentive.  

## 2015-03-14 NOTE — Progress Notes (Signed)
D:  Per pt self inventory pt reports sleeping good, appetite fair, energy level low, ability to pay attention poor, rates depression at a 6 out of 10, hopelessness at a 6 out of 10, anxiety at a 6 out of 10, endorses passive SI on and off, contracts for safety, denies HI/AVH, anxious during interaction, goal today: "keeping anxiety under control without medications, practice my coping skills"     A:  Emotional support provided, Encouraged pt to continue with treatment plan and attend all group activities, q15 min checks maintained for safety.  R:  Pt is receptive, going to groups, interact appropriately, pleasant and cooperative with staff and other patients on the unit.

## 2015-03-14 NOTE — Progress Notes (Signed)
[facility] MD Progress Note  03/14/2015 7:31 PM Mary Harper  MRN:  [id number] Subjective:  Mary Harper states she is trying hard to get herself together. Has had suicidal ideas on and off, but would not act on them. States she is frustrated as her attempts to get independent housing etc have not been successful. Admits she is very hard hard on herself, tends to personalize everything. She is still worried about the court date, having to see her ex BF there and have to relieve everything again Principal Problem: Bipolar affective disorder, depressed, severe Diagnosis:   Patient Active Problem List   Diagnosis Date Noted  . Suicidal ideations [R45.851] 10/03/2014    Priority: High  . Bipolar affective disorder, depressed, severe [F31.4] 07/13/2014    Priority: High  . PTSD (post-traumatic stress disorder) [F43.10] 03/12/2015  . 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 Alcohol intoxication [F10.129]    Total Time spent with patient: 30 minutes   Past Medical History:  Past Medical History  Diagnosis Date  . Depression   . Bipolar 1 disorder   . Alcoholism   . PTSD (post-traumatic stress disorder)   . Carpal tunnel syndrome on both sides   . Gastric bypass status for obesity   . Anxiety    History reviewed. No pertinent past surgical history. Family History: History reviewed. No pertinent family history. Social History:  History  Alcohol Use  . Yes     History  Drug Use No    Comment: binge drink about every 3 weeks     Social History   Social History  . Marital Status: Single    Spouse Name: N/A  . Number of Children: N/A  . Years of Education: N/A   Social History Main Topics  . Smoking status: Never Smoker   . Smokeless tobacco: Never Used  . Alcohol Use: Yes  . Drug Use: No     Comment: binge drink about every 3 weeks   . Sexual Activity: Not Currently   Other Topics Concern  . None   Social History Narrative   Additional History:    Sleep:  Fair  Appetite:  Fair   Assessment:   Musculoskeletal: Strength & Muscle Tone: within normal limits Gait & Station: normal Patient leans: normal   Psychiatric Specialty Exam: Physical Exam  Review of Systems  Constitutional: Negative.   HENT: Negative.   Eyes: Negative.   Respiratory: Negative.   Cardiovascular: Negative.   Gastrointestinal: Negative.   Genitourinary: Negative.   Musculoskeletal: Negative.   Skin: Negative.   Neurological: Negative.   Endo/Heme/Allergies: Negative.   Psychiatric/Behavioral: Positive for depression and substance abuse. The patient is nervous/anxious.     Blood pressure 93/78, pulse 108, temperature 98.6 F (37 C), temperature source Oral, resp. rate 18, height 5\' 4"  (1.626 m), weight 87.998 kg (194 lb), last menstrual period 03/09/2015.Body mass index is 33.28 kg/(m^2).  General Appearance: Fairly Groomed  Patent attorney::  Fair  Speech:  Clear and Coherent  Volume:  Decreased  Mood:  Anxious and Depressed  Affect:  anxious worried  Thought Process:  Coherent and Goal Directed  Orientation:  Full (Time, Place, and Person)  Thought Content:  symptoms events worries concerns  Suicidal Thoughts:  on and off  Homicidal Thoughts:  No  Memory:  Immediate;   Fair Recent;   Fair Remote;   Fair  Judgement:  Fair  Insight:  Present and Shallow  Psychomotor Activity:  Restlessness  Concentration:  Fair  Recall:  Fair  [employer] of Knowledge:Fair  Language: Fair  Akathisia:  No  Handed:  Right  AIMS (if indicated):     Assets:  Desire for Improvement  ADL's:  Intact  Cognition: WNL  Sleep:  Number of Hours: 6.75     Current Medications: Current Facility-Administered Medications  Medication Dose Route Frequency Provider Last Rate Last Dose  . acetaminophen (TYLENOL) tablet 650 mg  650 mg Oral Q6H PRN Charm Rings, NP   650 mg at 03/13/15 0758  . alum & mag hydroxide-simeth (MAALOX/MYLANTA) 200-200-20 MG/5ML suspension 30 mL  30 mL Oral  Q4H PRN Charm Rings, NP      . buPROPion (WELLBUTRIN XL) 24 hr tablet 150 mg  150 mg Oral Daily Rachael Fee, MD   150 mg at 03/14/15 0748  . divalproex (DEPAKOTE ER) 24 hr tablet 750 mg  750 mg Oral BID Rachael Fee, MD   750 mg at 03/14/15 0748  . eszopiclone (LUNESTA) tablet 3 mg  3 mg Oral QHS Rachael Fee, MD   3 mg at 03/13/15 2116  . hydrOXYzine (ATARAX/VISTARIL) tablet 50 mg  50 mg Oral Q6H PRN Rachael Fee, MD      . LORazepam (ATIVAN) tablet 1 mg  1 mg Oral Q6H PRN Rachael Fee, MD   1 mg at 03/14/15 0749  . magnesium hydroxide (MILK OF MAGNESIA) suspension 30 mL  30 mL Oral Daily PRN Charm Rings, NP      . traZODone (DESYREL) tablet 150 mg  150 mg Oral QHS Rachael Fee, MD   150 mg at 03/13/15 2116    Lab Results: No results found for this or any previous visit (from the past 48 hour(s)).  Physical Findings: AIMS: Facial and Oral Movements Muscles of Facial Expression: None, normal Lips and Perioral Area: None, normal Jaw: None, normal Tongue: None, normal,Extremity Movements Upper (arms, wrists, hands, fingers): None, normal Lower (legs, knees, ankles, toes): None, normal, Trunk Movements Neck, shoulders, hips: None, normal, Overall Severity Severity of abnormal movements (highest score from questions above): None, normal Incapacitation due to abnormal movements: None, normal Patient's awareness of abnormal movements (rate only patient's report): No Awareness, Dental Status Current problems with teeth and/or dentures?: No Does patient usually wear dentures?: No  CIWA:    COWS:     Treatment Plan Summary: Daily contact with patient to assess and evaluate symptoms and progress in treatment and Medication management Supportive approach/coping skills Mood instability; will continue the Depakote at 750 mg BID Will get a Depakote level in the next few days Alcohol dependence; work a relapse prevention plan Depression; continue the Wellbutrin XL 150 mg consider  increasing to 300 mg  Work with CBT/minfulness Explore other placement options Medical Decision Making:  Review of Psycho-Social Stressors (1) and Review of Medication Regimen & Side Effects (2)     Mary Harper A 03/14/2015, 7:31 PM

## 2015-03-14 NOTE — Progress Notes (Signed)
Patient did not attend karaoke group.  

## 2015-03-14 NOTE — [facility] Group Notes (Signed)
[facility] LCSW Group Therapy  03/14/2015 11:16 AM  Type of Therapy:  Group Therapy  Participation Level:  Active  Participation Quality:  Attentive  Affect:  Depressed but improving   Cognitive:  Alert and Oriented  Insight:  Engaged  Engagement in Therapy:  Engaged  Modes of Intervention:  Confrontation, Discussion, Education, Exploration, Problem-solving, Rapport Building, Socialization and Support  Summary of Progress/Problems:  Finding Balance in Life. Today's group focused on defining balance in one's own words, identifying things that can knock one off balance, and exploring healthy ways to maintain balance in life. Group members were asked to provide an example of a time when they felt off balance, describe how they handled that situation,and process healthier ways to regain balance in the future. Group members were asked to share the most important tool for maintaining balance that they learned while at [facility] and how they plan to apply this method after discharge. Ailen was attentive and engaged during today's processing group. She shared that for her, balance has always been an issue. "I used to be a workaholic and never took care of myself. Now that I'm on disability, I find it hard to care for myself properly and still tend to neglect my needs." Keven stated that stable housing was the major link missing in her life and created a constant state of unbalance. She was receptive to completed the Vulnerability Index Vi-SPDAT in order to get further assistance with her attempt to find stable housing.   Smart, Mansur Patti LCSWA  03/14/2015, 11:16 AM

## 2015-03-15 MED ORDER — BUPROPION HCL ER (SR) 100 MG PO TB12
100.0000 mg | ORAL_TABLET | Freq: Two times a day (BID) | ORAL | Status: DC
  Administered 2015-03-15 – 2015-03-18 (×7): 100 mg via ORAL
  Filled 2015-03-15 (×10): qty 1

## 2015-03-15 NOTE — Progress Notes (Signed)
Patient ID: Mary Harper, female   DOB: [birth year], 49 y.o.   MRN: [id number] PER STATE REGULATIONS 482.30  THIS CHART WAS REVIEWED FOR MEDICAL NECESSITY WITH RESPECT TO THE PATIENT'S ADMISSION/ DURATION OF STAY.  NEXT REVIEW DATE: 03/19/2015  Willa Rough, RN, BSN CASE MANAGER
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
## 2015-03-15 NOTE — [facility] Group Notes (Signed)
[facility] Group Notes:  (Nursing/MHT/Case Management/Adjunct)  Date:   03/14/2015  LATE ENTRY Time:  0900  Type of Therapy:  Nurse Education : the focus of the group is on helping patients  Identify unhealthy .  Participation Level:  Active  Participation Quality:  Appropriate  Affect:  Appropriate  Cognitive:  Alert  Insight:  Appropriate  Engagement in Group:  Engaged  Modes of Intervention:  Discussion  Summary of Progress/Problems:  Mary Harper 03/15/2015, 10:11 AM

## 2015-03-15 NOTE — Progress Notes (Signed)
Patient did attend the evening speaker AA meeting.  
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
## 2015-03-15 NOTE — Progress Notes (Signed)
Recreation Therapy Notes  Date: 08.12.16 Time: 9:30 am Location: [address] Group Room  Group Topic: Stress Management  Goal Area(s) Addresses:  Patient will verbalize importance of using healthy stress management.  Patient will identify positive emotions associated with healthy stress management.   Behavioral Response: Engaged  Intervention: Stress Management  Activity :  Guided Training and development officer.  LRT will introduce and educate the patients on the stress management technique of guided imagery.  A script was used to deliver the technique to the patients.  Patients were asked to follow the script read aloud by the LRT to engage in the stress management technique.  Education:  Stress Management, Discharge Planning.   Education Outcome: Acknowledges edcuation/In group clarification offered/Needs additional education  Clinical Observations/Feedback: Patient attended group.   Caroll Rancher, LRT/CTRS  Caroll Rancher A 03/15/2015 3:51 PM

## 2015-03-15 NOTE — Progress Notes (Signed)
[facility] MD Progress Note  03/15/2015 2:54 PM Mary Harper  MRN:  [id number] Subjective:  States that she is still  having a hard time. She states she wants to have some hope that things are going to get better. Gets tearful when talking about this. She did sleep better last night. She states she knows it will take some time to get things right. Upset with herself as she had been doing better until this event now.  Principal Problem: Bipolar affective disorder, depressed, severe Diagnosis:   Patient Active Problem List   Diagnosis Date Noted  . Suicidal ideations [R45.851] 10/03/2014    Priority: High  . Bipolar affective disorder, depressed, severe [F31.4] 07/13/2014    Priority: High  . PTSD (post-traumatic stress disorder) [F43.10] 03/12/2015  . 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 Alcohol intoxication [F10.129]    Total Time spent with patient: 30 minutes   Past Medical History:  Past Medical History  Diagnosis Date  . Depression   . Bipolar 1 disorder   . Alcoholism   . PTSD (post-traumatic stress disorder)   . Carpal tunnel syndrome on both sides   . Gastric bypass status for obesity   . Anxiety    History reviewed. No pertinent past surgical history. Family History: History reviewed. No pertinent family history. Social History:  History  Alcohol Use  . Yes     History  Drug Use No    Comment: binge drink about every 3 weeks     Social History   Social History  . Marital Status: Single    Spouse Name: N/A  . Number of Children: N/A  . Years of Education: N/A   Social History Main Topics  . Smoking status: Never Smoker   . Smokeless tobacco: Never Used  . Alcohol Use: Yes  . Drug Use: No     Comment: binge drink about every 3 weeks   . Sexual Activity: Not Currently   Other Topics Concern  . None   Social History Narrative   Additional History:    Sleep: Fair  Appetite:  Fair   Assessment:   Musculoskeletal: Strength & Muscle  Tone: within normal limits Gait & Station: normal Patient leans: normal   Psychiatric Specialty Exam: Physical Exam  Review of Systems  Constitutional: Negative.   HENT: Negative.   Eyes: Negative.   Respiratory: Negative.   Cardiovascular: Negative.   Gastrointestinal: Negative.   Genitourinary: Negative.   Musculoskeletal: Negative.   Skin: Negative.   Neurological: Negative.   Endo/Heme/Allergies: Negative.   Psychiatric/Behavioral: Positive for depression and substance abuse. The patient is nervous/anxious.     Blood pressure 90/65, pulse 113, temperature 98.8 F (37.1 C), temperature source Oral, resp. rate 16, height  (1.626 m), weight 87.998 kg (194 lb), last menstrual period 03/09/2015.Body mass index is 33.28 kg/(m^2).  General Appearance: Fairly Groomed  Patent attorney::  Fair  Speech:  Clear and Coherent  Volume:  Normal  Mood:  Anxious and Depressed  Affect:  Depressed and Tearful  Thought Process:  Coherent and Goal Directed  Orientation:  Full (Time, Place, and Person)  Thought Content:  symptoms events worries concerns  Suicidal Thoughts:  On and off  Homicidal Thoughts:  No  Memory:  Immediate;   Fair Recent;   Fair Remote;   Fair  Judgement:  Fair  Insight:  Present  Psychomotor Activity:  Restlessness  Concentration:  Fair  Recall:  [name] of Knowledge:Fair  Language: Fair  Akathisia:  No  Handed:  Right  AIMS (if indicated):     Assets:  Desire for Improvement  ADL's:  Intact  Cognition: WNL  Sleep:  Number of Hours: 6.5     Current Medications: Current Facility-Administered Medications  Medication Dose Route Frequency Provider Last Rate Last Dose  . acetaminophen (TYLENOL) tablet 650 mg  650 mg Oral Q6H PRN Charm Rings, NP   650 mg at 03/15/15 1610  . alum & mag hydroxide-simeth (MAALOX/MYLANTA) 200-200-20 MG/5ML suspension 30 mL  30 mL Oral Q4H PRN Charm Rings, NP      . buPROPion (WELLBUTRIN XL) 24 hr tablet 150 mg  150 mg  Oral Daily Rachael Fee, MD   150 mg at 03/15/15 9604  . divalproex (DEPAKOTE ER) 24 hr tablet 750 mg  750 mg Oral BID Rachael Fee, MD   750 mg at 03/15/15 5409  . eszopiclone (LUNESTA) tablet 3 mg  3 mg Oral QHS Rachael Fee, MD   3 mg at 03/14/15 2134  . hydrOXYzine (ATARAX/VISTARIL) tablet 50 mg  50 mg Oral Q6H PRN Rachael Fee, MD      . LORazepam (ATIVAN) tablet 1 mg  1 mg Oral Q6H PRN Rachael Fee, MD   1 mg at 03/14/15 0749  . magnesium hydroxide (MILK OF MAGNESIA) suspension 30 mL  30 mL Oral Daily PRN Charm Rings, NP      . traZODone (DESYREL) tablet 150 mg  150 mg Oral QHS Rachael Fee, MD   150 mg at 03/14/15 2134    Lab Results: No results found for this or any previous visit (from the past 48 hour(s)).  Physical Findings: AIMS: Facial and Oral Movements Muscles of Facial Expression: None, normal Lips and Perioral Area: None, normal Jaw: None, normal Tongue: None, normal,Extremity Movements Upper (arms, wrists, hands, fingers): None, normal Lower (legs, knees, ankles, toes): None, normal, Trunk Movements Neck, shoulders, hips: None, normal, Overall Severity Severity of abnormal movements (highest score from questions above): None, normal Incapacitation due to abnormal movements: None, normal Patient's awareness of abnormal movements (rate only patient's report): No Awareness, Dental Status Current problems with teeth and/or dentures?: No Does patient usually wear dentures?: No  CIWA:    COWS:     Treatment Plan Summary: Daily contact with patient to assess and evaluate symptoms and progress in treatment and Medication management Supportive approach/coping skills Alcohol dependence; continue to work a relapse prevention plan Mood instability; will continue to work with the Depakote Will get a Depakote level in the AM Depression; will continue the Wellbutrin but change to SR BID considering that she had gastric by pass Explore placement options Medical Decision  Making:  Review of Psycho-Social Stressors (1) and Review of Medication Regimen & Side Effects (2)     Kaarin Pardy A 03/15/2015, 2:54 PM

## 2015-03-15 NOTE — Plan of Care (Signed)
Problem: Ineffective individual coping Goal: STG: Patient will remain free from self harm Outcome: Progressing Pt contracts for safety     

## 2015-03-15 NOTE — Progress Notes (Signed)
D:Pt rates depression as a 5 and anxiety as a 4 on 1-10 scale with 10 being the most. Pt has c/o a headache this morning and was given prn tylenol. Pt has had passive si thoughts "twice today." A:Offered support, encouragement and 15 minute checks. R:Pt contracts with staff for safety. She denies hi and hallucinations. Safety maintained on the unit.

## 2015-03-15 NOTE — [facility] Group Notes (Signed)
[facility] LCSW Aftercare Discharge Planning Group Note   03/15/2015 10:42 AM  Participation Quality:  Appropriate   Mood/Affect:  Appropriate  Depression Rating:  4-5  Anxiety Rating:  6  Thoughts of Suicide:  No (not since she woke up this AM).  Will you contract for safety?   Yes  Current AVH:  No  Plan for Discharge/Comments:  Pt reports that she is slowly feeling better and less depressed/Suicidal. Pt reports no w/d sx and plans to return to shelter at d/c; continue follow-up with [name].   Transportation Means: ACT team? John   Supports: ACT team, one close female friend   Counselling psychologist, Oncologist

## 2015-03-15 NOTE — [facility] Group Notes (Signed)
[facility] LCSW Group Therapy  03/15/2015 1:13 PM  Type of Therapy:  Group Therapy  Participation Level:  Active  Participation Quality:  Attentive  Affect:  Appropriate  Cognitive:  Alert and Oriented  Insight:  Engaged  Engagement in Therapy:  Improving  Modes of Intervention:  Confrontation, Discussion, Education, Exploration, Problem-solving, Rapport Building, Socialization and Support  Summary of Progress/Problems: Feelings around Relapse. Group members discussed the meaning of relapse and shared personal stories of relapse, how it affected them and others, and how they perceived themselves during this time. Group members were encouraged to identify triggers, warning signs and coping skills used when facing the possibility of relapse. Social supports were discussed and explored in detail. Post Acute Withdrawal Syndrome (handout provided) was introduced and examined. Pt's were encouraged to ask questions, talk about key points associated with PAWS, and process this information in terms of relapse prevention. Mary Harper was attentive and engaged during today's processing group. She shared that she has experienced PAWS in the past but did not understand what this was. Mary Harper acknowledged the importance of having an effective safety plan and people/supports to call on if necessary. She talked about the importance of self care and giving herself an opportunity to relax.   Smart, Mary Harper LCSWA 03/15/2015, 1:13 PM

## 2015-03-16 DIAGNOSIS — R45851 Suicidal ideations: Secondary | ICD-10-CM

## 2015-03-16 DIAGNOSIS — F314 Bipolar disorder, current episode depressed, severe, without psychotic features: Principal | ICD-10-CM

## 2015-03-16 DIAGNOSIS — F1020 Alcohol dependence, uncomplicated: Secondary | ICD-10-CM

## 2015-03-16 LAB — VALPROIC ACID LEVEL: VALPROIC ACID LVL: 62 ug/mL (ref 50.0–100.0)

## 2015-03-16 MED ORDER — GABAPENTIN 100 MG PO CAPS: 200.0000 mg | ORAL_CAPSULE | Freq: Three times a day (TID) | ORAL | Status: DC

## 2015-03-16 MED ORDER — GABAPENTIN 100 MG PO CAPS
100.0000 mg | ORAL_CAPSULE | Freq: Three times a day (TID) | ORAL | Status: DC
  Administered 2015-03-16 – 2015-03-21 (×15): 100 mg via ORAL
  Filled 2015-03-16 (×11): qty 1
  Filled 2015-03-16: qty 9
  Filled 2015-03-16: qty 1
  Filled 2015-03-16 (×3): qty 9
  Filled 2015-03-16 (×2): qty 1
  Filled 2015-03-16: qty 9
  Filled 2015-03-16 (×2): qty 1
  Filled 2015-03-16: qty 9
  Filled 2015-03-16: qty 1
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
## 2015-03-16 NOTE — Progress Notes (Signed)
  D: Patient pleasant and cooperative with assessment. Pt noted to laugh and interact appropriately with peers in the milieu. Pt playing cards earlier in the shift. Pt compliant with medications. Pt denies SI/HI or plans to harm herself at this time. A: Q 15 minute safety checks, encourage staff/peer interaction and group participation. Administer medications as ordered by MD.  R: No s/s of distress noted during shift.

## 2015-03-16 NOTE — [facility] Group Notes (Signed)
[facility] Group Notes:  (Nursing/MHT/Case Management/Adjunct)  Date:  03/16/2015  Time: 0900 am  Type of Therapy:  Psychoeducational Skills  Participation Level:  Active  Participation Quality:  Appropriate and Attentive  Affect:  Anxious  Cognitive:  Alert  Insight:  Lacking  Engagement in Group:  Engaged  Modes of Intervention:  Support  Summary of Progress/Problems: Patient was tearful during group.  Reports increasing anxiety.    Cranford Mon 03/16/2015, 11:01 AM

## 2015-03-16 NOTE — Progress Notes (Signed)
[facility] Group Notes:  (Nursing/MHT/Case Management/Adjunct)  Date:  03/16/2015  Time:  2045  Type of Therapy:  wrap up group  Participation Level:  Active  Participation Quality:  Appropriate, Attentive, Sharing and Supportive  Affect:  Depressed  Cognitive:  Appropriate  Insight:  Improving  Engagement in Group:  Engaged  Modes of Intervention:  Clarification, Education and Support  Summary of Progress/Problems:  Mary Harper 03/16/2015, 9:44 PM

## 2015-03-16 NOTE — Progress Notes (Signed)
NSG shift assessment. 7a-7p.   D: Affect blunted, mood depressed, behavior appropriate. Attends groups and participates. Patient self inventory indicated that she slept good last night. Reported to this Clinical research associate that she had nightmares about previous suicide attempts, but pt was able to handle her feelings and emotions and went back to sleep. She rates her depression as 5/10 today, hopelessness is 5/10 and anxiety is 8/10. Goal is to lower anxiety and wants to let the doctor know that she is picking and pulling her hair. Cooperative with staff and is getting along well with peers.   A: Observed pt interacting in group and in the milieu: Support and encouragement offered. Safety maintained with observations every 15 minutes.   R:  Pt has passing thoughts of suicide sometimes, but is able to contracts for safety and continues to follow the treatment plan, working on learning new coping skills.
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
## 2015-03-16 NOTE — Progress Notes (Signed)
Patient ID: Mary Harper, female   DOB: 1966/07/30, 49 y.o.   MRN: [id number] Pt initially in room with limited visibility in the milieu.  Pt expressed feelings of depression, anxiety and passive SI (verbally contracts for safety).  Pt reported today is the first day she has not experienced a panic attack.  Pt verbalized this was good for her.  Pt reported that several patients had been discharged today and the atmosphere was better.  Support and encouragement provided. Pt was receptive. Pt denied HI and AVH. Needs assessed. Pt denied. Fifteen minute checks continue for patient safety. Pt safe on unit.

## 2015-03-17 NOTE — Progress Notes (Signed)
Patient did attend the evening speaker AA meeting.  

## 2015-03-17 NOTE — [facility] Group Notes (Signed)
[facility] Group Notes:  (Clinical Social Work)  03/17/2015  1:15-2:15PM  Type of Therapy:  Process Group with Motivational Interviewing  Participation Level:  Active  Participation Quality:  Appropriate, Attentive, Sharing and Supportive  Affect:  Blunted  Cognitive:  Alert, Appropriate and Oriented  Insight:  Engaged  Engagement in Therapy:  Engaged  Modes of Intervention:   Education, Teacher, English as a foreign language, Activity  Summary of Progress/Problems:   The main focus of today's process group was to   1)  discuss the importance of adding supports  2)  define health supports versus unhealthy supports  3)  identify the patient's current unhealthy supports and plan how to handle them  4)  Identify the patient's current healthy supports and plan what to add.  An emphasis was placed on using counselor, doctor, therapy groups, 12-step groups, and problem-specific support groups to expand supports.    The patient expressed full comprehension of the concepts presented, and agreed that there is a need to add more supports.  The patient had attended similar group on another hall earlier in the day, but was more involved, supportive of others and insightful during this group.  Ambrose Mantle, LCSW 03/17/2015

## 2015-03-17 NOTE — Progress Notes (Signed)
Patient ID: Mary Harper, female   DOB: 11-Oct-1965, 49 y.o.   MRN: [id number] Patient ID: Mary Harper, female   DOB: 03-20-1966, 49 y.o.   MRN: [id number] [facility] MD Progress Note  03/17/2015 3:47 PM Mary Harper   MRN:  [id number]  Subjective:  [name] says, "My hopelessness & depression are better today. I did not have any nightmares last night & I slept well. However, the anxiety is still high. I had a panic attack during the am group sessions. I did excuse myself, went to my room, practiced my coping skills. This time, it worked. I feel better ".  Objective: Mary Harper is seen, chart reviewed. She is visible on the unit, participating in the group milieu. However, she stills with sad facial expressions. She complains of high anxiety levels, apparently suffered a panic attack during group sessions this am. Was able to excuse herself from the group, practiced her coping skills successfully.   Principal Problem: Bipolar affective disorder, depressed, severe Diagnosis:   Patient Active Problem List   Diagnosis Date Noted  . PTSD (post-traumatic stress disorder) [F43.10] 03/12/2015  . Suicidal ideations [R45.851] 10/03/2014  . 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 Bipolar affective disorder, depressed, severe [F31.4] 07/13/2014  . Alcohol intoxication [F10.129]    Total Time spent with patient: 15 minutes  Past Medical History:  Past Medical History  Diagnosis Date  . Depression   . Bipolar 1 disorder   . Alcoholism   . PTSD (post-traumatic stress disorder)   . Carpal tunnel syndrome on both sides   . Gastric bypass status for obesity   . Anxiety    History reviewed. No pertinent past surgical history.  Family History: History reviewed. No pertinent family history.  Social History:  History  Alcohol Use  . Yes     History  Drug Use No    Comment: binge drink about every 3 weeks     Social History   Social History  . Marital Status: Single    Spouse Name: N/A  . Number of  Children: N/A  . Years of Education: N/A   Social History Main Topics  . Smoking status: Never Smoker   . Smokeless tobacco: Never Used  . Alcohol Use: Yes  . Drug Use: No     Comment: binge drink about every 3 weeks   . Sexual Activity: Not Currently   Other Topics Concern  . None   Social History Narrative   Additional History:    Sleep: Good  Appetite:  Fair  Musculoskeletal: Strength & Muscle Tone: within normal limits Gait & Station: normal Patient leans: normal  Psychiatric Specialty Exam: Physical Exam  Review of Systems  Constitutional: Negative.   HENT: Negative.   Eyes: Negative.   Respiratory: Negative.   Cardiovascular: Negative.   Gastrointestinal: Negative.   Genitourinary: Negative.   Musculoskeletal: Negative.   Skin: Negative.   Neurological: Negative.   Endo/Heme/Allergies: Negative.   Psychiatric/Behavioral: Positive for depression and substance abuse. The patient is nervous/anxious.     Blood pressure 104/70, pulse 98, temperature 98.4 F (36.9 C), temperature source Oral, resp. rate 16, height 5\' 4"  (1.626 m), weight 87.998 kg (194 lb), last menstrual period 03/09/2015.Body mass index is 33.28 kg/(m^2).  General Appearance: Fairly Groomed  Patent attorney::  Fair  Speech:  Clear and Coherent  Volume:  Normal  Mood:  Anxious and Depressed  Affect:  Depressed and Tearful  Thought Process:  Coherent and Goal Directed  Orientation:  Full (Time,  Place, and Person)  Thought Content:  symptoms events worries concerns, Ruminations  Suicidal Thoughts:  On and off  Homicidal Thoughts:  No  Memory:  Immediate;   Fair Recent;   Fair Remote;   Fair  Judgement:  Fair  Insight:  Present  Psychomotor Activity:  Restlessness, high anxiety levels  Concentration:  Fair  Recall:  [name] of Knowledge:Fair  Language: Fair  Akathisia:  No  Handed:  Right  AIMS (if indicated):     Assets:  Desire for Improvement  ADL's:  Intact  Cognition: WNL   Sleep:  Number of Hours: 6   Current Medications: Current Facility-Administered Medications  Medication Dose Route Frequency Provider Last Rate Last Dose  . acetaminophen (TYLENOL) tablet 650 mg  650 mg Oral Q6H PRN Charm Rings, NP   650 mg at 03/15/15 8657  . alum & mag hydroxide-simeth (MAALOX/MYLANTA) 200-200-20 MG/5ML suspension 30 mL  30 mL Oral Q4H PRN Charm Rings, NP      . buPROPion [facility] SR) 12 hr tablet 100 mg  100 mg Oral BID Rachael Fee, MD   100 mg at 03/17/15 0842  . divalproex (DEPAKOTE ER) 24 hr tablet 750 mg  750 mg Oral BID Rachael Fee, MD   750 mg at 03/17/15 [phone]  . eszopiclone (LUNESTA) tablet 3 mg  3 mg Oral QHS Rachael Fee, MD   3 mg at 03/16/15 2055  . gabapentin (NEURONTIN) capsule 100 mg  100 mg Oral TID Sanjuana Kava, NP   100 mg at 03/17/15 1440  . hydrOXYzine (ATARAX/VISTARIL) tablet 50 mg  50 mg Oral Q6H PRN Rachael Fee, MD      . LORazepam (ATIVAN) tablet 1 mg  1 mg Oral Q6H PRN Rachael Fee, MD   1 mg at 03/16/15 0930  . magnesium hydroxide (MILK OF MAGNESIA) suspension 30 mL  30 mL Oral Daily PRN Charm Rings, NP      . traZODone (DESYREL) tablet 150 mg  150 mg Oral QHS Rachael Fee, MD   150 mg at 03/16/15 2055   Lab Results:  Results for orders placed or performed during the hospital encounter of 03/11/15 (from the past 48 hour(s))  Valproic acid level     Status: None   Collection Time: 03/16/15  6:30 AM  Result Value Ref Range   Valproic Acid Lvl 62 50.0 - 100.0 ug/mL    Comment: Performed at [facility]   Physical Findings: AIMS: Facial and Oral Movements Muscles of Facial Expression: None, normal Lips and Perioral Area: None, normal Jaw: None, normal Tongue: None, normal,Extremity Movements Upper (arms, wrists, hands, fingers): None, normal Lower (legs, knees, ankles, toes): None, normal, Trunk Movements Neck, shoulders, hips: None, normal, Overall Severity Severity of abnormal movements (highest  score from questions above): None, normal Incapacitation due to abnormal movements: None, normal Patient's awareness of abnormal movements (rate only patient's report): No Awareness, Dental Status Current problems with teeth and/or dentures?: No Does patient usually wear dentures?: No  CIWA:    COWS:     Treatment Plan Summary: Daily contact with patient to assess and evaluate symptoms and progress in treatment and Medication management Supportive approach/coping skills Alcohol dependence; continue to work a relapse prevention plan Mood instability; will continue to work with the Depakote. Depression; will continue the Wellbutrin but change to SR BID considering that she had gastric by pass. Agitation; Will continue Neuronton 100 mg tid. Explore placement  options. Continue current treatment plan.  Medical Decision Making:  Review of Psycho-Social Stressors (1), Review or order clinical lab tests (1), Review of Medication Regimen & Side Effects (2) and Review of New Medication or Change in Dosage (2)  Sanjuana Kava, PMHNP-BC 03/17/2015, 3:47 PM Agree with NP Progress Note, as above  Nehemiah Massed, MD
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
## 2015-03-18 MED ORDER — BUPROPION HCL ER (SR) 100 MG PO TB12
100.0000 mg | ORAL_TABLET | Freq: Every day | ORAL | Status: DC
  Administered 2015-03-19: 100 mg via ORAL
  Filled 2015-03-18 (×2): qty 1

## 2015-03-18 MED ORDER — BUPROPION HCL ER (SR) 150 MG PO TB12: 150.0000 mg | ORAL_TABLET | Freq: Every day | ORAL | Status: DC

## 2015-03-18 MED ORDER — BUPROPION HCL ER (SR) 150 MG PO TB12
150.0000 mg | ORAL_TABLET | Freq: Every morning | ORAL | Status: DC
  Administered 2015-03-19 – 2015-03-21 (×3): 150 mg via ORAL
  Filled 2015-03-18: qty 6
  Filled 2015-03-18: qty 1
  Filled 2015-03-18: qty 6
  Filled 2015-03-18 (×4): qty 1

## 2015-03-18 NOTE — Clinical Social Work Note (Signed)
CSW contacted Luther Parody from ACT team-made them aware that pt will more thank likely be with [place] for at least a few more days. [name] Jonny Ruiz) can pick up pt at discharge and return her to shelter.   Trula Slade, LCSWA Clinical Social Worker 03/18/2015 3:35 PM

## 2015-03-18 NOTE — Progress Notes (Signed)
Recreation Therapy Notes  Date: 08.15.2016 Time: 9:30am Location: 300 [address] Group Room   Group Topic: Stress Management  Goal Area(s) Addresses:  Patient will actively participate in stress management techniques presented during session.   Behavioral Response: Appropriate, Engaged   Intervention: Stress management techniques  Activity :  Deep Breathing & Guided Imagery. LRT provided instruction and demonstration on practice of Guided Imagery. Technique was coupled with deep breathing.   Education:  Stress Management, Discharge Planning.   Education Outcome: Acknowledges education  Clinical Observations/Feedback: Patient actively engaged in techniques delivered. Following techniques patient shared with members of group DBT has been helpful for her in the past regarding changing her habits and incorporating more healthy habits into her life.    Marykay Lex Layton Tappan, LRT/CTRS  Jim Philemon L 03/18/2015 3:09 PM

## 2015-03-18 NOTE — [facility] Group Notes (Signed)
Adult Psychoeducational Group Note  Date:  03/18/2015 Time:  9:32 PM  Group Topic/Focus:  AA Meeting  Participation Level:  Active  Participation Quality:  Appropriate  Affect:  Flat  Cognitive:  Appropriate  Insight: Appropriate  Engagement in Group:  Engaged  Modes of Intervention:  Discussion  Additional Comments:  Patient attended and shared with the group.  Caroll Rancher A 03/18/2015, 9:32 PM
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
## 2015-03-18 NOTE — [facility] Group Notes (Signed)
[facility] LCSW Aftercare Discharge Planning Group Note   03/18/2015 10:40 AM  Participation Quality:  Appropriate   Mood/Affect:  Depressed and Tearful  Depression Rating:  5-6  Anxiety Rating:  8-9  Thoughts of Suicide:  Yes Will you contract for safety?   Yes  Current AVH:  No  Plan for Discharge/Comments:  Pt reports that she had a bad night last night and is frustrated because "my suicidal thoughts and anxiety came back. I'm really having a bad morning." Pt reports having PTSD flashbacks and is struggling with mood stability this morning. MD made aware during tx team.   Transportation Means: act team Jonny Ruiz)   Supports: act team, dv shelter, Production assistant, radio, Oncologist

## 2015-03-18 NOTE — Progress Notes (Signed)
Patient ID: Mary Harper, female   DOB: November 14, 1965, 49 y.o.   MRN: [id number]  D- reports that she had a better day today and reports an improvement in depression. Attended AA group tonight.  A-support and encouragement provided, level 3 checks continued.  R- Receptive, safety maintained

## 2015-03-18 NOTE — Progress Notes (Signed)
D: Mary Harper was very anxious at change of shift, having what she described as a panic attack. She was tearful and shaky. She admits having SI but contracts for safety. No HI/AVH/pain reported. Since this a.m, she has reported feeling better. She still looks dysphoric. She reports feeling safe and well-treated, noting that Dr. Dub Mikes "won't release me until he knows I'm safe." She has attended groups.  A: Meds given as ordered, including PRN Ativan at 0741 for anxiety/panic. Q15 safety checks maintained. Support/encouragement offered during anxiety/panic. R: Pt remains free from harm and continues with treatment. Will continue to monitor for needs/safety.

## 2015-03-18 NOTE — [facility] Group Notes (Signed)
[facility] LCSW Group Therapy  03/18/2015 1:12 PM  Type of Therapy:  Group Therapy  Participation Level:  Active  Participation Quality:  Attentive  Affect:  Depressed and Flat  Cognitive:  Alert  Insight:  Improving  Engagement in Therapy:  Improving  Modes of Intervention:  Confrontation, Discussion, Education, Exploration, Limit-setting, Problem-solving, Rapport Building, Socialization and Support  Summary of Progress/Problems: Today's Topic: Overcoming Obstacles. Patients identified one short term goal and potential obstacles in reaching this goal. Patients processed barriers involved in overcoming these obstacles. Patients identified steps necessary for overcoming these obstacles and explored motivation (internal and external) for facing these difficulties head on. Mary Harper was attentive and engaged during today's processing group. She shared that her obstacle is "hopelessnesss and lack of motivation." Mary Harper stated that she keeps running into obstacles when looking for stable housing "I'm doing everything in my power and it's just not working out for me." CSW and Simrin talked about alternatives-[facility], shelter to save money, Catering manager. Mary Harper stated that she hopes to get stable on meds in order to get her mental health "under control" in order to think more clearly and rationally about situational stressors such as temporary/unstable housing situation.      Smart, Mary Harper LCSWA  03/18/2015, 1:12 PM
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
## 2015-03-19 NOTE — Progress Notes (Signed)
D: Per patient self inventory form patient reports she slept good last night with the use of sleep medication. She reports a fair appetite, low energy level, poor concentration. She rates depression 5/10, hopelessness 5/10, anxiety 7/10- all on 1-10 scale, 10 being the worse. She denies physical pain. Reports passive SI. Denies HI. Denies AVH. She reports her goal for the day is "reminding myself that things aren't so hopeless, keep my mind on the positive." She reports she will meet her goal by "list good things in my life." She presents with anxiety.   A:Special checks q 15 mins in place for safety. Medication administered per MD order (see eMAR) PRN medication administered for anxiety. Support and encouragement provided.    R:Pt able to verbally contract for safety. Safety maintained. Compliant with medication regimen. Will continue to monitor.

## 2015-03-19 NOTE — [facility] Group Notes (Signed)
[facility] LCSW Group Therapy  03/19/2015 11:39 AM  Type of Therapy:  Group Therapy  Participation Level:  Active  Participation Quality:  Attentive  Affect:  Appropriate  Cognitive:  Appropriate  Insight:  Improving  Engagement in Therapy:  Improving  Modes of Intervention:  Discussion, Education, Exploration, Problem-solving, Rapport Building, Socialization and Support  Summary of Progress/Problems: MHA Speaker came to talk about his personal journey with substance abuse and addiction. The pt processed ways by which to relate to the speaker. MHA speaker provided handouts and educational information pertaining to groups and services offered by the [facility].   Smart, Bessie Boyte LCSWA  03/19/2015, 11:39 AM
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
## 2015-03-19 NOTE — Progress Notes (Signed)
Recreation Therapy Notes  Animal-Assisted Activity (AAA) Program Checklist/Progress Notes Patient Eligibility Criteria Checklist & Daily Group note for Rec Tx Intervention  Date: 08.16.2016 Time: 2:45pm Location: [address]    AAA/T Program Assumption of Risk Form signed by Patient/ or Parent Legal Guardian yes  Patient is free of allergies or sever asthma yes  Patient reports no fear of animals yes  Patient reports no history of cruelty to animals yes  Patient understands his/her participation is voluntary yes  Behavioral Response: Did not attend.    Mary Harper, LRT/CTRS        Mary Harper 03/19/2015 3:14 PM

## 2015-03-19 NOTE — Progress Notes (Signed)
[facility] Group Notes:  (Nursing/MHT/Case Management/Adjunct)  Date:  03/19/2015  Time: 2100 Type of Therapy:  wrap up group  Participation Level:  Active  Participation Quality:  Appropriate, Attentive, Sharing and Supportive  Affect:  Flat  Cognitive:  Appropriate  Insight:  Lacking  Engagement in Group:  Engaged  Modes of Intervention:  Clarification, Education and Support  Summary of Progress/Problems: Pt shared her desire to live safe and on her own. Pt is interested in attending an all womens AA meeting which she already has directions and schedule for. Pt also expressed interest in a group held by [employer].   Shelah Lewandowsky 03/19/2015, 9:58 PM

## 2015-03-19 NOTE — Progress Notes (Signed)
Patient ID: Mary Harper, female   DOB: 1965-09-08, 49 y.o.   MRN: [id number] PER STATE REGULATIONS 482.30  THIS CHART WAS REVIEWED FOR MEDICAL NECESSITY WITH RESPECT TO THE PATIENT'S ADMISSION/ DURATION OF STAY.  NEXT REVIEW DATE: 03/23/2015  Willa Rough, RN, BSN CASE MANAGER

## 2015-03-19 NOTE — Progress Notes (Signed)
Patient in bed during the assessment. She reported that she is feeling better than she was last week. Although she endorsed some anxiety related to her up coming court date.  She denied SI/HI and denied Hallucinations. Writer encouraged and supported patient. Q 15 minute check continues as ordered to maintain safety.

## 2015-03-19 NOTE — [facility] Group Notes (Signed)
[facility] Group Notes:  (Nursing/MHT/Case Management/Adjunct)  Date:  03/19/2015  Time: 0900 Type of Therapy:  Nurse Education  Participation Level:  Active  Participation Quality:  Appropriate  Affect:  Appropriate  Cognitive:  Alert and Appropriate  Insight:  Appropriate  Engagement in Group:  Engaged  Modes of Intervention:  Discussion and Support  Summary of Progress/Problems:  Dara Hoyer 03/19/2015, 10:00 AM

## 2015-03-20 MED ORDER — BUPROPION HCL ER (SR) 150 MG PO TB12
150.0000 mg | ORAL_TABLET | Freq: Every day | ORAL | Status: DC
  Administered 2015-03-20: 150 mg via ORAL
  Filled 2015-03-20 (×3): qty 1

## 2015-03-20 NOTE — Plan of Care (Signed)
Problem: Diagnosis: Increased Risk For Suicide Attempt Goal: STG-Patient Will Attend All Groups On The Unit Outcome: Progressing Pt attended evening group on 03/20/15.  Problem: Alteration in mood Goal: LTG-Patient reports reduction in suicidal thoughts (Patient reports reduction in suicidal thoughts and is able to verbalize a safety plan for whenever patient is feeling suicidal)  Outcome: Progressing Pt denied SI tonight.  She verbally contracts for safety.

## 2015-03-20 NOTE — Progress Notes (Signed)
D: Pt has appropriate affect and pleasant mood.  She reports her day was "good" and that her goal today was to "keep my anxiety under control and I did that."  Pt reports she was able to do this by "deep-breathing."  She reports she is discharging tomorrow and that she feels ready and safe to discharge.  Pt denies SI/HI, denies hallucinations, denies pain, denies withdrawal symptoms.  Pt has been visible in milieu interacting with peers and staff appropriately.  Pt attended evening group.   A: Introduced self to pt.  Met with pt 1:1 and provided support and encouragement.  Actively listened to pt.  Medications administered per order.   R: Pt is compliant with medications.  Pt verbally contracts for safety.  Will continue to monitor and assess.   

## 2015-03-20 NOTE — Progress Notes (Signed)
Recreation Therapy Notes  Date: 08.17.2016 Time: 9:30am Location: [address] Group room   Group Topic: Stress Management  Goal Area(s) Addresses:  Patient will actively participate in stress management techniques presented during session.   Behavioral Response: Engaged, Appropriate   Intervention: Stress management techniques  Activity :  Deep Breathing and Progressive Body Muscle Relaxation. LRT provided instruction and demonstration on practice of Progressive Muscle Relaxation. Technique was coupled with deep breathing.   Education:  Stress Management, Discharge Planning.   Education Outcome: Acknowledges education  Clinical Observations/Feedback: Patient actively participated in stress management technique taught during session. Patient expressed no difficulties and that she could complete technique independently post d/c.    Marykay Lex Reuben Knoblock, LRT/CTRS  Bow Buntyn L 03/20/2015 11:51 AM

## 2015-03-20 NOTE — [facility] Group Notes (Signed)
[facility] LCSW Group Therapy  03/20/2015 1:19 PM  Type of Therapy:  Group Therapy  Participation Level:  Active  Participation Quality:  Attentive  Affect:  Appropriate and Tearful  Cognitive:  Alert and Oriented  Insight:  Engaged  Engagement in Therapy:  Engaged  Modes of Intervention:  Confrontation, Discussion, Education, Exploration, Problem-solving, Rapport Building, Socialization and Support  Summary of Progress/Problems: Emotion Regulation: This group focused on both positive and negative emotion identification and allowed group members to process ways to identify feelings, regulate negative emotions, and find healthy ways to manage internal/external emotions. Group members were asked to reflect on a time when their reaction to an emotion led to a negative outcome and explored how alternative responses using emotion regulation would have benefited them. Group members were also asked to discuss a time when emotion regulation was utilized when a negative emotion was experienced. Mary Harper was attentive and engaged during today's processing group. She shared that she struggles with depression but has accepted her diagnosis and wants people to understand her depression rather than invalidate it. [name] talked about both positive and negative reactions when sharing her diagnosis with others. [name] talked about the support she receives through her [name] team.   Smart, [place] LCSWA  03/20/2015, 1:19 PM
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
## 2015-03-20 NOTE — [facility] Group Notes (Signed)
[facility] LCSW Aftercare Discharge Planning Group Note   03/20/2015 11:13 AM  Participation Quality:  Appropriate   Mood/Affect:  Appropriate  Depression Rating:  2  Anxiety Rating:  2  Thoughts of Suicide:  No Will you contract for safety?   NA  Current AVH:  No  Plan for Discharge/Comments:  Pt reports "feeling better than I have since I came here" this morning. Pt hoping to d/c on Thursday if she continues to feel better and will return to [facility] shelter. CSW to contact [place] on ACT team to request appt/transportation at d/c.   Transportation Means: [name]-[facility]  Supports: act team, Child psychotherapist at [employer]; one female best friend.   Smart, [employer]
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
## 2015-03-20 NOTE — Progress Notes (Signed)
D: Per patient self inventory form pt reports she slept good with the use of sleep medication. She reports a good appetite, low energy, poor concentration. She rates depression 3/10, hopelessness 3/10, anxiety 5/10- all on 1-10 scale, 10 being the worse. She denies physical pain.  Denies SI/HI. Denies AVH. Presents with bright affect. Reports she is having a good day. Attending groups on the unit. She reports her goal is "Planning my discharge and a plan for when I go home day by day for the first week." She reports she will meet her goal by "write my plans down."   A:Special checks q 15 mins in place for safety. Medication administered per MD order (see eMAR). Encouragement and support provided.   R: Safety maintained. Will continue to monitor.

## 2015-03-20 NOTE — Progress Notes (Signed)
Pt attended NA group this evening.  

## 2015-03-21 MED ORDER — LORAZEPAM 1 MG PO TABS: ORAL_TABLET | ORAL | Status: DC

## 2015-03-21 MED ORDER — BUPROPION HCL ER (SR) 150 MG PO TB12: 150.0000 mg | ORAL_TABLET | Freq: Two times a day (BID) | ORAL | Status: DC

## 2015-03-21 MED ORDER — GABAPENTIN 100 MG PO CAPS: 100.0000 mg | ORAL_CAPSULE | Freq: Three times a day (TID) | ORAL | Status: DC

## 2015-03-21 MED ORDER — ESZOPICLONE 3 MG PO TABS: 3.0000 mg | ORAL_TABLET | Freq: Every day | ORAL | Status: DC

## 2015-03-21 MED ORDER — BUPROPION HCL ER (SR) 150 MG PO TB12
150.0000 mg | ORAL_TABLET | Freq: Two times a day (BID) | ORAL | Status: DC
Start: 2015-03-21 — End: 2015-04-23

## 2015-03-21 MED ORDER — DIVALPROEX SODIUM ER 250 MG PO TB24: 750.0000 mg | ORAL_TABLET | Freq: Two times a day (BID) | ORAL | Status: DC

## 2015-03-21 MED ORDER — HYDROXYZINE HCL 50 MG PO TABS: 50.0000 mg | ORAL_TABLET | Freq: Four times a day (QID) | ORAL | Status: DC | PRN

## 2015-03-21 MED ORDER — TRAZODONE HCL 150 MG PO TABS: 150.0000 mg | ORAL_TABLET | Freq: Every day | ORAL | Status: DC

## 2015-03-21 NOTE — Progress Notes (Signed)
Nutrition Education Note  RD led a group providing general, healthful nutrition education.  RD emphasized the importance of eating regular meals and snacks throughout the day. Consuming sugar-free beverages and incorporating fruits and vegetables into diet when possible. Provided examples of healthy snacks. Encouraged physical activity for at least 60 minutes a day. Patient encouraged to leave group with a goal to improve nutrition/healthy eating.   Expect good compliance.  Diet Order: Diet Heart Room service appropriate?: Yes; Fluid consistency:: Thin Pt is also offered choice of unit snacks mid-morning and mid-afternoon.  Pt is eating as desired.   Labs and medications reviewed. If additional nutrition issues arise, please consult RD.  Alysa Duca, MS, RD, LDN Pager: [phone] After Hours Pager: [phone]     

## 2015-03-21 NOTE — Discharge Summary (Signed)
Physician Discharge Summary Note  Patient:  Mary Harper is an 49 y.o., female MRN:  [id number] DOB:  [birth year] Patient phone:  [phone] (home)  Patient address:   [address] [place] [address],  Total Time spent with patient: 45 minutes  Date of Admission:  03/11/2015 Date of Discharge: 03/21/2015  Reason for Admission:  Depression  Principal Problem: Bipolar affective disorder, depressed, severe Discharge Diagnoses: Patient Active Problem List   Diagnosis Date Noted  . PTSD (post-traumatic stress disorder) [F43.10] 03/12/2015  . Suicidal ideations [R45.851] 10/03/2014  . 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 Bipolar affective disorder, depressed, severe [F31.4] 07/13/2014  . Alcohol intoxication [F10.129]     Musculoskeletal: Strength & Muscle Tone: within normal limits Gait & Station: normal Patient leans: N/A  Psychiatric Specialty Exam: Physical Exam  Vitals reviewed. Psychiatric: Her mood appears not anxious. She is not slowed. She does not exhibit a depressed mood.    Review of Systems  Constitutional: Negative for fever.  Respiratory: Negative for cough.   Cardiovascular: Negative for chest pain.  Genitourinary: Negative for dysuria.  Skin: Negative for rash.  Neurological: Negative for dizziness and headaches.  Psychiatric/Behavioral: Negative for depression.    Blood pressure 94/68, pulse 98, temperature 98.9 F (37.2 C), temperature source Oral, resp. rate 16, height 5\' 4"  (1.626 m), weight 87.998 kg (194 lb), last menstrual period 03/09/2015.Body mass index is 33.28 kg/(m^2).   General Appearance: Fairly Groomed  Patent attorney:: Fair  Speech: Clear and Coherent409  Volume: Normal  Mood: Euthymic  Affect: Appropriate  Thought Process: Coherent and Goal Directed  Orientation: Full (Time, Place, and Person)  Thought Content: plans as she moves on, relapse prevention plan  Suicidal Thoughts: No  Homicidal  Thoughts: No  Memory: Immediate; Fair Recent; Fair Remote; Fair  Judgement: Fair  Insight: Present  Psychomotor Activity: normal  Concentration: Fair  Recall: [name] of Knowledge:Fair  Language: Fair  Akathisia: No  Handed: Right  AIMS (if indicated):    Assets: Desire for improvement, vocational educational  Sleep: Number of Hours: 5.75  Cognition: WNL  ADL's: Intact       Have you used any form of tobacco in the last 30 days? (Cigarettes, Smokeless Tobacco, Cigars, and/or Pipes): No  Has this patient used any form of tobacco in the last 30 days? (Cigarettes, Smokeless Tobacco, Cigars, and/or Pipes) N/A  Past Medical History:  Past Medical History  Diagnosis Date  . Depression   . Bipolar 1 disorder   . Alcoholism   . PTSD (post-traumatic stress disorder)   . Carpal tunnel syndrome on both sides   . Gastric bypass status for obesity   . Anxiety    History reviewed. No pertinent past surgical history. Family History: History reviewed. No pertinent family history. Social History:  History  Alcohol Use  . Yes     History  Drug Use No    Comment: binge drink about every 3 weeks     Social History   Social History  . Marital Status: Single    Spouse Name: N/A  . Number of Children: N/A  . Years of Education: N/A   Social History Main Topics  . Smoking status: Never Smoker   . Smokeless tobacco: Never Used  . Alcohol Use: Yes  . Drug Use: No     Comment: binge drink about every 3 weeks   . Sexual Activity: Not Currently   Other Topics Concern  . None   Social History  Narrative   Risk to Self: Is patient at risk for suicide?: Yes Risk to Others:   Prior Inpatient Therapy:   Prior Outpatient Therapy:    Level of Care:  OP 42  Hospital Course:  Mary Harper was admitted for Bipolar affective disorder, depressed, severe and crisis management.  She was treated discharged with the medications listed below under  Medication List.  Medical problems were identified and treated as needed.  Home medications were restarted as appropriate.  Improvement was monitored by observation and Mary Harper daily report of symptom reduction.  Emotional and mental status was monitored by daily self-inventory reports completed by Mary Harper and clinical staff.         Mary Harper was evaluated by the treatment team for stability and plans for continued recovery upon discharge.  Mary Harper motivation was an integral factor for scheduling further treatment.  Employment, transportation, bed availability, health status, family support, and any pending legal issues were also considered during her hospital stay. She was offered further treatment options upon discharge including but not limited to Residential, Intensive Outpatient, and Outpatient treatment.  Mary Harper will follow up with the services as listed below under Follow Up Information.     Upon completion of this admission the patient was both mentally and medically stable for discharge denying suicidal/homicidal ideation, auditory/visual/tactile hallucinations, delusional thoughts and paranoia.      Consults:  psychiatry  Significant Diagnostic Studies:  labs: per ED  Discharge Vitals:   Blood pressure 94/68, pulse 98, temperature 98.9 F (37.2 C), temperature source Oral, resp. rate 16, height  (1.626 m), weight 87.998 kg (194 lb), last menstrual period 03/09/2015. Body mass index is 33.28 kg/(m^2). Lab Results:   No results found for this or any previous visit (from the past 72 hour(s)).  Physical Findings: AIMS: Facial and Oral Movements Muscles of Facial Expression: None, normal Lips and Perioral Area: None, normal Jaw: None, normal Tongue: None, normal,Extremity Movements Upper (arms, wrists, hands, fingers): None, normal Lower (legs, knees, ankles, toes): None, normal, Trunk Movements Neck, shoulders, hips: None, normal, Overall Severity Severity of  abnormal movements (highest score from questions above): None, normal Incapacitation due to abnormal movements: None, normal Patient's awareness of abnormal movements (rate only patient's report): No Awareness, Dental Status Current problems with teeth and/or dentures?: No Does patient usually wear dentures?: No  CIWA:    COWS:      See Psychiatric Specialty Exam and Suicide Risk Assessment completed by Attending Physician prior to discharge.  Discharge destination:  Home  Is patient on multiple antipsychotic therapies at discharge:  No   Has Patient had three or more failed trials of antipsychotic monotherapy by history:  No    Recommended Plan for Multiple Antipsychotic Therapies: NA     Medication List    STOP taking these medications        acetaminophen 325 MG tablet  Commonly known as:  TYLENOL     ARIPiprazole 5 MG tablet  Commonly known as:  ABILIFY     buPROPion 150 MG 24 hr tablet  Commonly known as:  WELLBUTRIN XL  Replaced by:  buPROPion 150 MG 12 hr tablet     FLUoxetine 20 MG capsule  Commonly known as:  PROZAC     lithium citrate 300 MG/5ML solution     mometasone 50 MCG/ACT nasal spray  Commonly known as:  NASONEX     prazosin 1 MG capsule  Commonly known as:  MINIPRESS  Valproic Acid 250 MG/5ML Syrp syrup  Commonly known as:  DEPAKENE      TAKE these medications      Indication   buPROPion 150 MG 12 hr tablet  Commonly known as:  WELLBUTRIN SR  Take 1 tablet (150 mg total) by mouth 2 (two) times daily.   Indication:  Major Depressive Disorder     divalproex 250 MG 24 hr tablet  Commonly known as:  DEPAKOTE ER  Take 3 tablets (750 mg total) by mouth 2 (two) times daily.   Indication:  Mood Stabilization     Eszopiclone 3 MG Tabs  Take 1 tablet (3 mg total) by mouth at bedtime. Take immediately before bedtime   Indication:  Trouble Sleeping     gabapentin 100 MG capsule  Commonly known as:  NEURONTIN  Take 1 capsule (100 mg  total) by mouth 3 (three) times daily.   Indication:  Agitation     hydrOXYzine 50 MG tablet  Commonly known as:  ATARAX/VISTARIL  Take 1 tablet (50 mg total) by mouth every 6 (six) hours as needed for anxiety.   Indication:  Anxiety Neurosis     LORazepam 1 MG tablet  Commonly known as:  ATIVAN  Take 1 mg (one tablet) Twice a day for four days then Take 1 mg ( one tablet) once a day for the next four days.   Indication:  Feeling Anxious     traZODone 150 MG tablet  Commonly known as:  DESYREL  Take 1 tablet (150 mg total) by mouth at bedtime.   Indication:  Trouble Sleeping           Follow-up Information    Follow up with [name] .   Why:  Per Luther Parody, ACT team nurse will pick up patient after 2:00PM today and will transport back to shelter/schedule next visit with patient.   Contact information:   [address] [place], [place] [address] Phone: [phone] Fax: [phone]      Follow-up recommendations:  Activity:  as tol, diet as tol  Comments:  1.  Take all your medications as prescribed.              2.  Report any adverse side effects to outpatient provider.                       3.  Patient instructed to not use alcohol or illegal drugs while on prescription medicines.            4.  In the event of worsening symptoms, instructed patient to call 911, the crisis hotline or go to nearest emergency room for evaluation of symptoms.  Total Discharge Time:  40 min  Signed: Velna Hatchet May Agustin AGNP-BC 03/21/2015, 2:36 PM  I personally assessed the patient and formulated the plan Madie Reno A. Dub Mikes, M.D.
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
## 2015-03-21 NOTE — Progress Notes (Signed)
Patient ID: Mary Harper, female   DOB: [birth year], 49 y.o.   MRN: [id number] Psychoeducational Group Note  Date: 03/21/2015  Time: 0930 Group Topic/Focus: Leisure Time  Identifying Needs:   The focus of this group is to help patients identify the importance of including leisure activities into their lives daily.  Participation Level:  Active  Participation Quality:Good  Affect: Flat  Cognitive:  Intact  Insight:  good  Engagement in Group Engaged   Additional Comments:    PDuke RN [employer]

## 2015-03-21 NOTE — Progress Notes (Signed)
Mary Harper is readied for DC as evidenced by the MD completing the DC SRA and DC order. She completes her daily assessment and on it she documented she denies experiencing SI today and she rates her feelings of depression, anxiety, and hopelessness " " 2/2/4", respectively.  She is given her DC AVS and it is reviewed with her, she states verbal understanding and willingness to comply. SHe is given prescriptions for her meds as well as samples from the pharmacy and all belongings in her locker are returned to her and she signed release per protocol. She is escorted to the bldg entrance and dc'd per MD order.

## 2015-03-21 NOTE — Progress Notes (Signed)
  [facility] Adult Case Management Discharge Plan :  Will you be returning to the same living situation after discharge:  Yes,  dv shelter At discharge, do you have transportation home?: Yes,  ACT team coming after 2pm to transport pt Do you have the ability to pay for your medications: Yes,  Medicare  Release of information consent forms completed and submitted to Medical Records by CSW.  Patient to Follow up at: Follow-up Information    Follow up with [name] .   Why:  Per Luther Parody, ACT team nurse will pick up patient after 2:00PM today and will transport back to shelter/schedule next visit with patient.   Contact information:   [address] [place], [place] [address] Phone: [phone] Fax: [phone]      Patient denies SI/HI: Yes,  during group/self report.     Safety Planning and Suicide Prevention discussed: Yes,  SPE completed with pt's guardian [name].  Have you used any form of tobacco in the last 30 days? (Cigarettes, Smokeless Tobacco, Cigars, and/or Pipes): No  Has patient been referred to the Quitline?: N/A patient is not a smoker  Smart, Chitina LCSWA 03/21/2015, 8:46 AM

## 2015-04-03 ENCOUNTER — Encounter (HOSPITAL_COMMUNITY): Payer: Self-pay | Admitting: Psychiatry

## 2015-04-22 ENCOUNTER — Emergency Department (HOSPITAL_COMMUNITY): Payer: Medicare Other

## 2015-04-22 ENCOUNTER — Encounter (HOSPITAL_COMMUNITY): Payer: Self-pay | Admitting: *Deleted

## 2015-04-22 ENCOUNTER — Emergency Department (HOSPITAL_COMMUNITY)
Admission: EM | Admit: 2015-04-22 | Discharge: 2015-04-23 | Payer: Medicare Other | Attending: Emergency Medicine | Admitting: Emergency Medicine

## 2015-04-22 DIAGNOSIS — R87618 Other abnormal cytological findings on specimens from cervix uteri: Secondary | ICD-10-CM | POA: Insufficient documentation

## 2015-04-22 DIAGNOSIS — Z79899 Other long term (current) drug therapy: Secondary | ICD-10-CM | POA: Insufficient documentation

## 2015-04-22 DIAGNOSIS — Z8669 Personal history of other diseases of the nervous system and sense organs: Secondary | ICD-10-CM | POA: Insufficient documentation

## 2015-04-22 DIAGNOSIS — F419 Anxiety disorder, unspecified: Secondary | ICD-10-CM | POA: Insufficient documentation

## 2015-04-22 DIAGNOSIS — F4310 Post-traumatic stress disorder, unspecified: Secondary | ICD-10-CM | POA: Diagnosis not present

## 2015-04-22 DIAGNOSIS — F319 Bipolar disorder, unspecified: Secondary | ICD-10-CM | POA: Diagnosis not present

## 2015-04-22 DIAGNOSIS — B86 Scabies: Secondary | ICD-10-CM | POA: Diagnosis not present

## 2015-04-22 DIAGNOSIS — N889 Noninflammatory disorder of cervix uteri, unspecified: Secondary | ICD-10-CM

## 2015-04-22 DIAGNOSIS — L03312 Cellulitis of back [any part except buttock]: Secondary | ICD-10-CM | POA: Diagnosis not present

## 2015-04-22 DIAGNOSIS — R45851 Suicidal ideations: Secondary | ICD-10-CM | POA: Diagnosis not present

## 2015-04-22 DIAGNOSIS — N939 Abnormal uterine and vaginal bleeding, unspecified: Secondary | ICD-10-CM | POA: Insufficient documentation

## 2015-04-22 DIAGNOSIS — F1310 Sedative, hypnotic or anxiolytic abuse, uncomplicated: Secondary | ICD-10-CM | POA: Diagnosis not present

## 2015-04-22 DIAGNOSIS — Z3202 Encounter for pregnancy test, result negative: Secondary | ICD-10-CM | POA: Diagnosis not present

## 2015-04-22 DIAGNOSIS — Z9884 Bariatric surgery status: Secondary | ICD-10-CM | POA: Diagnosis not present

## 2015-04-22 DIAGNOSIS — F1020 Alcohol dependence, uncomplicated: Secondary | ICD-10-CM | POA: Diagnosis present

## 2015-04-22 DIAGNOSIS — F3130 Bipolar disorder, current episode depressed, mild or moderate severity, unspecified: Secondary | ICD-10-CM | POA: Diagnosis not present

## 2015-04-22 DIAGNOSIS — N888 Other specified noninflammatory disorders of cervix uteri: Secondary | ICD-10-CM

## 2015-04-22 DIAGNOSIS — L089 Local infection of the skin and subcutaneous tissue, unspecified: Secondary | ICD-10-CM | POA: Diagnosis present

## 2015-04-22 LAB — COMPREHENSIVE METABOLIC PANEL
ALK PHOS: 91 U/L (ref 38–126)
ALT: 31 U/L (ref 14–54)
ANION GAP: 16 — AB (ref 5–15)
AST: 61 U/L — ABNORMAL HIGH (ref 15–41)
Albumin: 4.1 g/dL (ref 3.5–5.0)
BILIRUBIN TOTAL: 0.9 mg/dL (ref 0.3–1.2)
BUN: 6 mg/dL (ref 6–20)
CALCIUM: 8.9 mg/dL (ref 8.9–10.3)
CO2: 22 mmol/L (ref 22–32)
CREATININE: 0.61 mg/dL (ref 0.44–1.00)
Chloride: 100 mmol/L — ABNORMAL LOW (ref 101–111)
Glucose, Bld: 91 mg/dL (ref 65–99)
Potassium: 4.1 mmol/L (ref 3.5–5.1)
SODIUM: 138 mmol/L (ref 135–145)
TOTAL PROTEIN: 7.2 g/dL (ref 6.5–8.1)

## 2015-04-22 LAB — CBC
HCT: 41.2 % (ref 36.0–46.0)
Hemoglobin: 13.9 g/dL (ref 12.0–15.0)
MCH: 32.1 pg (ref 26.0–34.0)
MCHC: 33.7 g/dL (ref 30.0–36.0)
MCV: 95.2 fL (ref 78.0–100.0)
Platelets: 329 10*3/uL (ref 150–400)
RBC: 4.33 MIL/uL (ref 3.87–5.11)
RDW: 12.8 % (ref 11.5–15.5)
WBC: 9.5 10*3/uL (ref 4.0–10.5)

## 2015-04-22 LAB — RAPID URINE DRUG SCREEN, HOSP PERFORMED
Amphetamines: NOT DETECTED
Barbiturates: NOT DETECTED
Benzodiazepines: POSITIVE — AB
COCAINE: NOT DETECTED
OPIATES: NOT DETECTED
Tetrahydrocannabinol: NOT DETECTED

## 2015-04-22 LAB — ETHANOL

## 2015-04-22 LAB — WET PREP, GENITAL
CLUE CELLS WET PREP: NONE SEEN
TRICH WET PREP: NONE SEEN
Yeast Wet Prep HPF POC: NONE SEEN

## 2015-04-22 LAB — ACETAMINOPHEN LEVEL

## 2015-04-22 LAB — SALICYLATE LEVEL

## 2015-04-22 LAB — PREGNANCY, URINE: Preg Test, Ur: NEGATIVE

## 2015-04-22 MED ORDER — PERMETHRIN 5 % EX CREA
TOPICAL_CREAM | Freq: Once | CUTANEOUS | Status: AC
  Administered 2015-04-22: 1 via TOPICAL
  Filled 2015-04-22: qty 60

## 2015-04-22 MED ORDER — LORAZEPAM 1 MG PO TABS
2.0000 mg | ORAL_TABLET | ORAL | Status: AC
  Administered 2015-04-22: 2 mg via ORAL

## 2015-04-22 MED ORDER — ONDANSETRON HCL 4 MG PO TABS: 4.0000 mg | ORAL_TABLET | Freq: Three times a day (TID) | ORAL | Status: DC | PRN

## 2015-04-22 MED ORDER — CLINDAMYCIN HCL 300 MG PO CAPS
300.0000 mg | ORAL_CAPSULE | Freq: Four times a day (QID) | ORAL | Status: DC
  Administered 2015-04-22 – 2015-04-23 (×4): 300 mg via ORAL
  Filled 2015-04-22 (×4): qty 1

## 2015-04-22 MED ORDER — LORAZEPAM 1 MG PO TABS
1.0000 mg | ORAL_TABLET | Freq: Three times a day (TID) | ORAL | Status: DC | PRN
  Administered 2015-04-23: 1 mg via ORAL
  Filled 2015-04-22 (×3): qty 1

## 2015-04-22 MED ORDER — ACETAMINOPHEN 325 MG PO TABS: 650.0000 mg | ORAL_TABLET | ORAL | Status: DC | PRN

## 2015-04-22 MED ORDER — LORAZEPAM 1 MG PO TABS
2.0000 mg | ORAL_TABLET | Freq: Once | ORAL | Status: AC
  Administered 2015-04-22: 2 mg via ORAL
  Filled 2015-04-22: qty 2

## 2015-04-22 MED ORDER — IBUPROFEN 200 MG PO TABS
600.0000 mg | ORAL_TABLET | Freq: Three times a day (TID) | ORAL | Status: DC | PRN
  Administered 2015-04-22: 600 mg via ORAL
  Filled 2015-04-22: qty 3

## 2015-04-22 MED ORDER — ALUM & MAG HYDROXIDE-SIMETH 200-200-20 MG/5ML PO SUSP: 30.0000 mL | ORAL | Status: DC | PRN

## 2015-04-22 NOTE — ED Notes (Signed)
Pt in US

## 2015-04-22 NOTE — ED Notes (Signed)
Unable to collect labs at this time patient shaking too bad.

## 2015-04-22 NOTE — ED Notes (Signed)
TTS at bedside. 

## 2015-04-22 NOTE — ED Notes (Signed)
Patient transported to Ultrasound 

## 2015-04-22 NOTE — ED Notes (Signed)
MD at bedside for access and do Pelvic.

## 2015-04-22 NOTE — ED Notes (Signed)
Unable to collect labs at this time patient getting a pelvic done.

## 2015-04-22 NOTE — Progress Notes (Signed)
Patient noted as having Medicare insurance without a pcp.  EDCm went to speak to patient at bedside, however, patient was at testing.  [facility] provided patient with list of pcps who accept Medicare insurance within a ten mile radius of patient's zip code [address].  This information was placed on patient's bedside table.  No further EDCM needs at this time.

## 2015-04-22 NOTE — ED Notes (Signed)
Pt shaking and states if I could have killed herself I could. Pt notified that she was safe. Pt request to have IM ativan instead of PO.

## 2015-04-22 NOTE — ED Notes (Signed)
Staffing notified of sitter need 

## 2015-04-22 NOTE — ED Notes (Signed)
Bed: ZO10 Expected date:  Expected time:  Means of arrival:  Comments: Triage 9
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
## 2015-04-22 NOTE — ED Notes (Signed)
Pt states I am very anxious and cant stop shaking. EDP notified. Pt in room given new scrubs to fit size. Shorts placed in [place]. Pt offered meal.
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
## 2015-04-22 NOTE — ED Notes (Signed)
[phone] - [facility] office [phone]- Social worker for pt cell [phone] [facility] crisis number

## 2015-04-23 DIAGNOSIS — R45851 Suicidal ideations: Secondary | ICD-10-CM

## 2015-04-23 DIAGNOSIS — F3130 Bipolar disorder, current episode depressed, mild or moderate severity, unspecified: Secondary | ICD-10-CM | POA: Diagnosis not present

## 2015-04-23 LAB — GC/CHLAMYDIA PROBE AMP (~~LOC~~) NOT AT ARMC
CHLAMYDIA, DNA PROBE: NEGATIVE
NEISSERIA GONORRHEA: NEGATIVE

## 2015-04-23 MED ORDER — LORAZEPAM 1 MG PO TABS: 0.0000 mg | ORAL_TABLET | Freq: Two times a day (BID) | ORAL | Status: DC

## 2015-04-23 MED ORDER — TRAZODONE HCL 50 MG PO TABS: 50.0000 mg | ORAL_TABLET | Freq: Every evening | ORAL | Status: DC | PRN

## 2015-04-23 MED ORDER — LORAZEPAM 2 MG/ML IJ SOLN: 0.0000 mg | Freq: Two times a day (BID) | INTRAMUSCULAR | Status: DC

## 2015-04-23 MED ORDER — GABAPENTIN 300 MG PO CAPS
300.0000 mg | ORAL_CAPSULE | Freq: Two times a day (BID) | ORAL | Status: DC
  Administered 2015-04-23: 300 mg via ORAL
  Filled 2015-04-23: qty 1

## 2015-04-23 MED ORDER — PRAZOSIN HCL 1 MG PO CAPS: 1.0000 mg | ORAL_CAPSULE | Freq: Every day | ORAL | Status: DC

## 2015-04-23 MED ORDER — LORAZEPAM 1 MG PO TABS
1.0000 mg | ORAL_TABLET | Freq: Three times a day (TID) | ORAL | Status: DC | PRN
  Administered 2015-04-23: 1 mg via ORAL
  Filled 2015-04-23: qty 1

## 2015-04-23 MED ORDER — THIAMINE HCL 100 MG/ML IJ SOLN: 100.0000 mg | Freq: Every day | INTRAMUSCULAR | Status: DC

## 2015-04-23 MED ORDER — PRAZOSIN HCL 2 MG PO CAPS
2.0000 mg | ORAL_CAPSULE | Freq: Every day | ORAL | Status: DC
  Filled 2015-04-23: qty 1

## 2015-04-23 MED ORDER — LORAZEPAM 2 MG/ML IJ SOLN: 0.0000 mg | Freq: Four times a day (QID) | INTRAMUSCULAR | Status: DC

## 2015-04-23 MED ORDER — LORAZEPAM 1 MG PO TABS
1.0000 mg | ORAL_TABLET | ORAL | Status: DC | PRN
  Administered 2015-04-23: 1 mg via ORAL
  Filled 2015-04-23: qty 1

## 2015-04-23 MED ORDER — GABAPENTIN 100 MG PO CAPS: 100.0000 mg | ORAL_CAPSULE | Freq: Three times a day (TID) | ORAL | Status: DC

## 2015-04-23 MED ORDER — LORAZEPAM 1 MG PO TABS
0.0000 mg | ORAL_TABLET | Freq: Four times a day (QID) | ORAL | Status: DC
Start: 2015-04-23 — End: 2015-04-23

## 2015-04-23 MED ORDER — VITAMIN B-1 100 MG PO TABS: 100.0000 mg | ORAL_TABLET | Freq: Every day | ORAL | Status: DC

## 2015-04-23 MED ORDER — DIVALPROEX SODIUM ER 500 MG PO TB24: 750.0000 mg | ORAL_TABLET | Freq: Two times a day (BID) | ORAL | Status: DC

## 2015-04-23 MED ORDER — TRAZODONE HCL 50 MG PO TABS: 150.0000 mg | ORAL_TABLET | Freq: Every day | ORAL | Status: DC

## 2015-04-23 MED ORDER — DIVALPROEX SODIUM ER 500 MG PO TB24
500.0000 mg | ORAL_TABLET | Freq: Two times a day (BID) | ORAL | Status: DC
  Administered 2015-04-23: 500 mg via ORAL
  Filled 2015-04-23 (×4): qty 1

## 2015-04-23 NOTE — ED Notes (Signed)
Pt requesting more ativan. States that the Dr. Len Blalock by and told her she could have more. EDP to be contacted.

## 2015-04-23 NOTE — ED Notes (Signed)
This RN went to speak with EDP. EPD is gone for the evening.

## 2015-04-23 NOTE — Consult Note (Signed)
[facility] Consult   Reason for Consult:  Bipolar disorder, dep[ressed, Anxiety disorder Referring Physician:  EDP Patient Identification: Mary Harper MRN:  [id number] Principal Diagnosis: Bipolar I disorder, most recent episode depressed Diagnosis:   Patient Active Problem List   Diagnosis Date Noted  . Alcohol use disorder, severe, dependence [F10.20] 12/02/2014    Priority: High  . Alcohol intoxication [F10.129]     Priority: High  . PTSD (post-traumatic stress disorder) [F43.10] 03/12/2015  . Bipolar I disorder, most recent episode depressed [F31.30] 12/02/2014  . Suicidal ideations [R45.851] 10/03/2014  . 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 Bipolar affective disorder, depressed, severe [F31.4] 07/13/2014    Total Time spent with patient: 1 hour  Subjective:   Mary Harper is a 49 y.o. female patient admitted with  Bipolar disorder, dep[ressed, Anxiety disorder.  HPI:  Caucasian female, 49 years old was evaluated for suicidal ideation with plans to jump unto traffic.  Patient reports feeling anxious and stated that she only takes Ativan for that.  Patient was discharged from our inpatient unit last month for treatment of PTSD.  Patient reports that she came in because she could not handle her anxiety any longer and that she is homeless.  Patient reports that her outpatient provider DR Zeb Comfort is no longer prescribing Ativan for her.  Patient reports that she has been living in [place] and that her sleep is poor  Due to homelessness.  Patient is unable to contract for safety and states she will jump onto traffic if allowed to leave.  She has  been accepted for admission and she has been accepted at [facility] and will be transported as soon as one is available.  HPI Elements:   Location:  Bipolar 1 disorder, depressed, Alcohol dependence by hx, Anxiety disorder, Chronic PTSD by hx. Quality:  severe, suicidal ideation, homelessness,  insomnia. Severity:  severe. Timing:  acute. Duration:  Chronic mental illness. Context:  Seeking treatment for anxierty, Bipolar disorder.  Past Medical History:  Past Medical History  Diagnosis Date  . Depression   . Bipolar 1 disorder   . Alcoholism   . PTSD (post-traumatic stress disorder)   . Carpal tunnel syndrome on both sides   . Gastric bypass status for obesity   . Anxiety     Past Surgical History  Procedure Laterality Date  . Gastric bypass     Family History:  Family History  Problem Relation Age of Onset  . Alcoholism Other     many relatives on mother side with alcohol abuse issues   Social History:  History  Alcohol Use  . Yes    Comment: Binge Drinking      History  Drug Use No    Comment: binge drink about every 3 weeks     Social History   Social History  . Marital Status: Single    Spouse Name: N/A  . Number of Children: N/A  . Years of Education: N/A   Social History Main Topics  . Smoking status: Never Smoker   . Smokeless tobacco: Never Used  . Alcohol Use: Yes     Comment: Binge Drinking   . Drug Use: No     Comment: binge drink about every 3 weeks   . Sexual Activity: Not Currently   Other Topics Concern  . 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 Horse-Derived Products Hives  . Lactose Intolerance (Gi) Diarrhea     Labs:  Results for orders placed or performed during the hospital encounter of 04/22/15 (from the past 48 hour(s))  Urine rapid drug screen (hosp performed) (Not at [facility])     Status: Abnormal   Collection Time: 04/22/15  4:30 PM  Result Value Ref Range   Opiates NONE DETECTED NONE DETECTED   Cocaine NONE DETECTED NONE DETECTED   Benzodiazepines POSITIVE (A) NONE DETECTED   Amphetamines NONE DETECTED NONE DETECTED   Tetrahydrocannabinol NONE DETECTED NONE DETECTED   Barbiturates NONE DETECTED NONE DETECTED    Comment:        DRUG SCREEN FOR MEDICAL PURPOSES ONLY.  IF CONFIRMATION IS NEEDED FOR ANY PURPOSE, NOTIFY LAB WITHIN 5 DAYS.        LOWEST DETECTABLE LIMITS FOR URINE DRUG SCREEN Drug Class       Cutoff (ng/mL) Amphetamine      1000 Barbiturate      200 Benzodiazepine   709 Tricyclics       643 Opiates          300 Cocaine          300 THC              50   Wet prep, genital     Status: Abnormal   Collection Time: 04/22/15  5:25 PM  Result Value Ref Range   Yeast Wet Prep HPF POC NONE SEEN NONE SEEN   Trich, Wet Prep NONE SEEN NONE SEEN   Clue Cells Wet Prep HPF POC NONE SEEN NONE SEEN   WBC, Wet Prep HPF POC RARE (A) NONE SEEN  Pregnancy, urine     Status: None   Collection Time: 04/22/15  5:30 PM  Result Value Ref Range   Preg Test, Ur NEGATIVE NEGATIVE    Comment:        THE SENSITIVITY OF THIS METHODOLOGY IS >20 mIU/mL.   Comprehensive metabolic panel     Status: Abnormal   Collection Time: 04/22/15  5:40 PM  Result Value Ref Range   Sodium 138 135 - 145 mmol/L   Potassium 4.1 3.5 - 5.1 mmol/L   Chloride 100 (L) 101 - 111 mmol/L   CO2 22 22 - 32 mmol/L   Glucose, Bld 91 65 - 99 mg/dL   BUN 6 6 - 20 mg/dL   Creatinine, Ser 0.61 0.44 - 1.00 mg/dL   Calcium 8.9 8.9 - 10.3 mg/dL   Total Protein 7.2 6.5 - 8.1 g/dL   Albumin 4.1 3.5 - 5.0 g/dL   AST 61 (H) 15 - 41 U/L   ALT 31 14 - 54 U/L   Alkaline Phosphatase 91 38 - 126 U/L   Total Bilirubin 0.9  0.3 - 1.2 mg/dL   GFR calc  non Af Amer >60 >60 mL/min   GFR calc Af Amer >60 >60 mL/min    Comment: (NOTE) The eGFR has been calculated using the CKD EPI equation. This calculation has not been validated in all clinical situations. eGFR's persistently <60 mL/min signify possible Chronic Kidney Disease.    Anion gap 16 (H) 5 - 15  Ethanol (ETOH)     Status: None   Collection Time: 04/22/15  5:40 PM  Result Value Ref Range   Alcohol, Ethyl (B) <5 <5 mg/dL    Comment:        LOWEST DETECTABLE LIMIT FOR SERUM ALCOHOL IS 5 mg/dL FOR MEDICAL PURPOSES ONLY   Salicylate level     Status: None   Collection Time: 04/22/15  5:40 PM  Result Value Ref Range   Salicylate Lvl <3.7 2.8 - 30.0 mg/dL  Acetaminophen level     Status: Abnormal   Collection Time: 04/22/15  5:40 PM  Result Value Ref Range   Acetaminophen (Tylenol), Serum <10 (L) 10 - 30 ug/mL    Comment:        THERAPEUTIC CONCENTRATIONS VARY SIGNIFICANTLY. A RANGE OF 10-30 ug/mL MAY BE AN EFFECTIVE CONCENTRATION FOR MANY PATIENTS. HOWEVER, SOME ARE BEST TREATED AT CONCENTRATIONS OUTSIDE THIS RANGE. ACETAMINOPHEN CONCENTRATIONS >150 ug/mL AT 4 HOURS AFTER INGESTION AND >50 ug/mL AT 12 HOURS AFTER INGESTION ARE OFTEN ASSOCIATED WITH TOXIC REACTIONS.   CBC     Status: None   Collection Time: 04/22/15  5:40 PM  Result Value Ref Range   WBC 9.5 4.0 - 10.5 K/uL   RBC 4.33 3.87 - 5.11 MIL/uL   Hemoglobin 13.9 12.0 - 15.0 g/dL   HCT 41.2 36.0 - 46.0 %   MCV 95.2 78.0 - 100.0 fL   MCH 32.1 26.0 - 34.0 pg   MCHC 33.7 30.0 - 36.0 g/dL   RDW 12.8 11.5 - 15.5 %   Platelets 329 150 - 400 K/uL    Vitals: Blood pressure 138/66, pulse 63, temperature 97.8 F (36.6 C), temperature source Oral, resp. rate 14, SpO2 98 %.  Risk to Self: Suicidal Ideation: Yes-Currently Present Suicidal Intent: Yes-Currently Present Is patient at risk for suicide?: Yes Suicidal Plan?: Yes-Currently Present Specify Current Suicidal Plan: Overdose,  Cut wrists, Jump in traffic,truck or train; strangulation  Access to Means: Yes Specify Access to Suicidal Means: Sharps, Pills, Vehicles  What has been your use of drugs/alcohol within the last 12 months?: Alcohol abuse  How many times?: 30 Other Self Harm Risks: None  Triggers for Past Attempts: Family contact, Spouse contact, Other personal contacts Intentional Self Injurious Behavior: None Risk to Others: Homicidal Ideation: No Thoughts of Harm to Others: No Current Homicidal Intent: No Current Homicidal Plan: No Access to Homicidal Means: No Identified Victim: None  History of harm to others?: No Assessment of Violence: None Noted Violent Behavior Description: None  Does patient have access to weapons?: No Criminal Charges Pending?: No Does patient have a court date: No Prior Inpatient Therapy: Prior Inpatient Therapy: Yes Prior Therapy Dates: 11/2014; 10/2014; multiple admits Prior Therapy Facilty/Provider(s): [facility], [facility] Reason for Treatment: SI, bipolar disorder, PTSD, alcohol abuse Prior Outpatient Therapy: Prior Outpatient Therapy: Yes Prior Therapy Dates: Current Prior Therapy Facilty/Provider(s): [name] Reason for Treatment: Bipolar disorder, PTSD Does patient have an ACCT team?: Yes Does patient have Intensive In-House Services?  : No Does patient have [facility] services? : No Does patient have P4CC services?: No  Current Facility-Administered Medications  Medication Dose Route  Frequency Provider Last Rate Last Dose  . acetaminophen (TYLENOL) tablet 650 mg  650 mg Oral Q4H PRN Sharlett Iles, MD      . alum & mag hydroxide-simeth (MAALOX/MYLANTA) 200-200-20 MG/5ML suspension 30 mL  30 mL Oral PRN Wenda Overland Little, MD      . clindamycin (CLEOCIN) capsule 300 mg  300 mg Oral 4 times per day Sharlett Iles, MD   300 mg at 04/23/15 1213  . divalproex (DEPAKOTE ER) 24 hr tablet 500 mg  500 mg Oral BID Mojeed Akintayo   500 mg at 04/23/15 1213   . gabapentin (NEURONTIN) capsule 300 mg  300 mg Oral BID Mojeed Akintayo   300 mg at 04/23/15 1213  . ibuprofen (ADVIL,MOTRIN) tablet 600 mg  600 mg Oral Q8H PRN Sharlett Iles, MD   600 mg at 04/22/15 2030  . LORazepam (ATIVAN) tablet 1 mg  1 mg Oral Q8H PRN Mojeed Akintayo      . ondansetron (ZOFRAN) tablet 4 mg  4 mg Oral Q8H PRN Wenda Overland Little, MD      . prazosin (MINIPRESS) capsule 2 mg  2 mg Oral QHS Mojeed Akintayo      . traZODone (DESYREL) tablet 50 mg  50 mg Oral QHS PRN Mojeed Akintayo       Current Outpatient Prescriptions  Medication Sig Dispense Refill  . buPROPion (WELLBUTRIN SR) 150 MG 12 hr tablet Take 1 tablet (150 mg total) by mouth 2 (two) times daily. 60 tablet 0  . cyanocobalamin (,VITAMIN B-12,) 1000 MCG/ML injection Inject 1 mL (1,000 mcg total) into the muscle once a week. 1 mL 0  . cyclobenzaprine (FLEXERIL) 5 MG tablet Take 1 tablet (5 mg total) by mouth 3 (three) times daily as needed for muscle spasms. 15 tablet 0  . divalproex (DEPAKOTE ER) 250 MG 24 hr tablet Take 3 tablets (750 mg total) by mouth 2 (two) times daily. 180 tablet 0  . eszopiclone 3 MG TABS Take 1 tablet (3 mg total) by mouth at bedtime. Take immediately before bedtime 30 tablet 0  . gabapentin (NEURONTIN) 100 MG capsule Take 1 capsule (100 mg total) by mouth 3 (three) times daily. 90 capsule 0  . hydrOXYzine (ATARAX/VISTARIL) 50 MG tablet Take 1 tablet (50 mg total) by mouth every 6 (six) hours as needed for anxiety. 30 tablet 0  . LORazepam (ATIVAN) 1 MG tablet Take 1 mg (one tablet) Twice a day for four days then Take 1 mg ( one tablet) once a day for the next four days. 12 tablet 0  . prazosin (MINIPRESS) 1 MG capsule Take 1 capsule (1 mg total) by mouth at bedtime. 30 capsule 0  . traZODone (DESYREL) 150 MG tablet Take 1 tablet (150 mg total) by mouth at bedtime. 30 tablet 0  . traZODone (DESYREL) 150 MG tablet Take 1 tablet (150 mg total) by mouth at bedtime. 30 tablet 0  .  divalproex (DEPAKOTE) 250 MG DR tablet Take 3 tablets (750 mg total) by mouth 2 (two) times daily. (Patient not taking: Reported on 04/22/2015) 120 tablet 0  . hydrOXYzine (ATARAX/VISTARIL) 50 MG tablet Take 1 tablet (50 mg total) by mouth every 8 (eight) hours as needed for anxiety (Insomnia). 30 tablet 0    Musculoskeletal: Strength & Muscle Tone: within normal limits Gait & Station: normal Patient leans: N/A  Psychiatric Specialty Exam: Physical Exam  Review of Systems  Constitutional: Negative.   HENT: Negative.   Eyes: Negative.  Cardiovascular: Negative.   Gastrointestinal: Negative.   Genitourinary: Negative.   Musculoskeletal: Negative.   Skin: Negative.        Treated for scabies this morning  Neurological: Negative.   Endo/Heme/Allergies: Negative.     Blood pressure 138/66, pulse 63, temperature 97.8 F (36.6 C), temperature source Oral, resp. rate 14, SpO2 98 %.There is no weight on file to calculate BMI.  General Appearance: Casual and Disheveled  Eye Contact::  Minimal  Speech:  Clear and Coherent and Normal Rate  Volume:  Normal  Mood:  Anxious  Affect:  Congruent, Depressed and Flat  Thought Process:  Coherent, Goal Directed and Intact  Orientation:  Full (Time, Place, and Person)  Thought Content:  WDL  Suicidal Thoughts:  Yes.  with intent/plan  Homicidal Thoughts:  No  Memory:  Immediate;   Good Recent;   Good Remote;   Good  Judgement:  Impaired  Insight:  Shallow  Psychomotor Activity:  Psychomotor Retardation  Concentration:  Poor  Recall:  NA  Fund of Knowledge:Poor  Language: Fair  Akathisia:  NA  Handed:  Right  AIMS (if indicated):     Assets:  Desire for Improvement  ADL's:  Impaired  Cognition: WNL  Sleep:      Medical Decision Making: Established Problem, Worsening (2), Review of Medication Regimen & Side Effects (2) and Review of New Medication or Change in Dosage (2)  Treatment Plan Summary: Daily contact with patient to assess  and evaluate symptoms and progress in treatment and Medication management  Plan:  Resume home medications, Use Ativan 1 mg every 8 hours for severe anxiety and agitataion. Disposition: Admit and seek placement  Delfin Gant   PMHNP-BC  04/23/2015 1:27 PM Patient seen face-to-face for psychiatric evaluation, chart reviewed and case discussed with the physician extender and developed treatment plan. Reviewed the information documented and agree with the treatment plan. Corena Pilgrim, MD

## 2015-04-23 NOTE — ED Notes (Signed)
Pt has slept most of the night, she woke at 4 am for Ativan, she says she can it every 4 hours and the order is for every 8 hours.

## 2015-04-23 NOTE — BH Assessment (Addendum)
[facility] Assessment Progress Note  Per Thedore Mins, MD, this pt requires psychiatric hospitalization at this time.  At 13:12 Darleen calls from [facility].  Pt has been accepted to their facility by Dr. Chilton Si.  She will be assigned to Rm 2558 once the bed is vacated.  At that time Darleen will call back with the phone number for nurse-to-nurse report and will approve transfer.  Dahlia Byes, NP concurs with this plan.  Doylene Canning, MA Triage Specialist [phone]  Addendum:  This Clinical research associate spoke to the pt.  She is agreeable to being transferred.  She is under voluntary status and is to be transferred via [facility] after [facility] calls and informs [place] that they are ready to receive the pt.  Pt's nurse has been notified.  Doylene Canning, MA Triage Specialist [phone]  Addendum:  Pt accepted to [facility].  Please call report to [phone].  Pt's nurse notified.  Doylene Canning, MA Triage Specialist [phone]

## 2015-04-23 NOTE — BH Assessment (Signed)
Seeking inpt placement. Sent referrals to: [address] [employer], [place], [place], [place] Triage Specialist 04/23/2015 2:02 AM

## 2015-04-23 NOTE — ED Notes (Signed)
Pt reports being anxious, Dr Loretha Stapler called for increase in ativan frequency

## 2015-04-23 NOTE — ED Notes (Signed)
Pt sleeping, will hold ativan

## 2015-04-23 NOTE — ED Notes (Signed)
[facility] at nursing station [place]: to transport. Sitter Ms. Bullins to assist in transport. Pt began to cry and shake due to transport.

## 2015-04-23 NOTE — ED Notes (Signed)
This RN called [facility] [phone]. [facility] to transfer. Room 2558
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
## 2015-04-29 LAB — HIV 1/2 AB DIFFERENTIATION
HIV 1 Ab: NEGATIVE
HIV 2 Ab: NEGATIVE

## 2015-04-29 LAB — RNA QUALITATIVE: HIV 1 RNA Qualitative: 1

## 2015-04-29 LAB — HIV ANTIBODY (ROUTINE TESTING W REFLEX)

## 2015-08-01 ENCOUNTER — Inpatient Hospital Stay (HOSPITAL_COMMUNITY)
Admission: AD | Admit: 2015-08-01 | Discharge: 2015-08-09 | DRG: 885 | Disposition: A | Discharge status: Home / Self Care | Payer: Medicare Other | Source: Intra-hospital | Attending: Psychiatry | Admitting: Psychiatry

## 2015-08-01 ENCOUNTER — Emergency Department (HOSPITAL_COMMUNITY)
Admission: EM | Admit: 2015-08-01 | Discharge: 2015-08-01 | Disposition: A | Discharge status: Other Acute Inpatient Hospital | Payer: Medicare Other | Attending: Emergency Medicine | Admitting: Emergency Medicine

## 2015-08-01 ENCOUNTER — Encounter (HOSPITAL_COMMUNITY): Payer: Self-pay

## 2015-08-01 ENCOUNTER — Encounter (HOSPITAL_COMMUNITY): Payer: Self-pay | Admitting: Emergency Medicine

## 2015-08-01 DIAGNOSIS — F319 Bipolar disorder, unspecified: Secondary | ICD-10-CM | POA: Diagnosis present

## 2015-08-01 DIAGNOSIS — R51 Headache: Secondary | ICD-10-CM | POA: Diagnosis present

## 2015-08-01 DIAGNOSIS — Z79899 Other long term (current) drug therapy: Secondary | ICD-10-CM | POA: Diagnosis not present

## 2015-08-01 DIAGNOSIS — Z3202 Encounter for pregnancy test, result negative: Secondary | ICD-10-CM | POA: Insufficient documentation

## 2015-08-01 DIAGNOSIS — Z8669 Personal history of other diseases of the nervous system and sense organs: Secondary | ICD-10-CM | POA: Insufficient documentation

## 2015-08-01 DIAGNOSIS — F419 Anxiety disorder, unspecified: Secondary | ICD-10-CM | POA: Diagnosis present

## 2015-08-01 DIAGNOSIS — R45851 Suicidal ideations: Secondary | ICD-10-CM | POA: Diagnosis present

## 2015-08-01 DIAGNOSIS — G4700 Insomnia, unspecified: Secondary | ICD-10-CM | POA: Diagnosis present

## 2015-08-01 DIAGNOSIS — Z9884 Bariatric surgery status: Secondary | ICD-10-CM | POA: Insufficient documentation

## 2015-08-01 DIAGNOSIS — F1020 Alcohol dependence, uncomplicated: Secondary | ICD-10-CM | POA: Diagnosis present

## 2015-08-01 DIAGNOSIS — F314 Bipolar disorder, current episode depressed, severe, without psychotic features: Secondary | ICD-10-CM | POA: Diagnosis present

## 2015-08-01 DIAGNOSIS — F1310 Sedative, hypnotic or anxiolytic abuse, uncomplicated: Secondary | ICD-10-CM | POA: Insufficient documentation

## 2015-08-01 DIAGNOSIS — F4310 Post-traumatic stress disorder, unspecified: Secondary | ICD-10-CM | POA: Insufficient documentation

## 2015-08-01 LAB — CBC
HCT: 40.2 % (ref 36.0–46.0)
Hemoglobin: 13.3 g/dL (ref 12.0–15.0)
MCH: 32 pg (ref 26.0–34.0)
MCHC: 33.1 g/dL (ref 30.0–36.0)
MCV: 96.9 fL (ref 78.0–100.0)
PLATELETS: 276 10*3/uL (ref 150–400)
RBC: 4.15 MIL/uL (ref 3.87–5.11)
RDW: 13.6 % (ref 11.5–15.5)
WBC: 8.1 10*3/uL (ref 4.0–10.5)

## 2015-08-01 LAB — COMPREHENSIVE METABOLIC PANEL
ALK PHOS: 64 U/L (ref 38–126)
ALT: 14 U/L (ref 14–54)
AST: 26 U/L (ref 15–41)
Albumin: 3.5 g/dL (ref 3.5–5.0)
Anion gap: 14 (ref 5–15)
BILIRUBIN TOTAL: 0.9 mg/dL (ref 0.3–1.2)
BUN: 13 mg/dL (ref 6–20)
CALCIUM: 8.7 mg/dL — AB (ref 8.9–10.3)
CO2: 24 mmol/L (ref 22–32)
CREATININE: 0.52 mg/dL (ref 0.44–1.00)
Chloride: 97 mmol/L — ABNORMAL LOW (ref 101–111)
GFR calc non Af Amer: >60 mL/min (ref >60)
Glucose, Bld: 101 mg/dL — ABNORMAL HIGH (ref 65–99)
Potassium: 3.8 mmol/L (ref 3.5–5.1)
Sodium: 135 mmol/L (ref 135–145)
TOTAL PROTEIN: 6.4 g/dL — AB (ref 6.5–8.1)

## 2015-08-01 LAB — RAPID URINE DRUG SCREEN, HOSP PERFORMED
Amphetamines: NOT DETECTED
Barbiturates: POSITIVE — AB
Benzodiazepines: POSITIVE — AB
Cocaine: NOT DETECTED
OPIATES: NOT DETECTED
Tetrahydrocannabinol: NOT DETECTED

## 2015-08-01 LAB — SALICYLATE LEVEL

## 2015-08-01 LAB — POC URINE PREG, ED: PREG TEST UR: NEGATIVE

## 2015-08-01 LAB — ACETAMINOPHEN LEVEL: Acetaminophen (Tylenol), Serum: <10 ug/mL — ABNORMAL LOW (ref 10–30)

## 2015-08-01 LAB — ETHANOL

## 2015-08-01 MED ORDER — IBUPROFEN 200 MG PO TABS: 600.0000 mg | ORAL_TABLET | Freq: Three times a day (TID) | ORAL | Status: DC | PRN

## 2015-08-01 MED ORDER — TRAZODONE HCL 150 MG PO TABS
150.0000 mg | ORAL_TABLET | Freq: Every day | ORAL | Status: DC
  Administered 2015-08-02 – 2015-08-08 (×8): 150 mg via ORAL
  Filled 2015-08-01 (×11): qty 1

## 2015-08-01 MED ORDER — GABAPENTIN 100 MG PO CAPS
100.0000 mg | ORAL_CAPSULE | Freq: Three times a day (TID) | ORAL | Status: DC
  Administered 2015-08-01 (×2): 100 mg via ORAL
  Filled 2015-08-01 (×2): qty 1

## 2015-08-01 MED ORDER — CYCLOBENZAPRINE HCL 10 MG PO TABS: 5.0000 mg | ORAL_TABLET | Freq: Three times a day (TID) | ORAL | Status: DC | PRN

## 2015-08-01 MED ORDER — MAGNESIUM HYDROXIDE 400 MG/5ML PO SUSP: 30.0000 mL | Freq: Every day | ORAL | Status: DC | PRN

## 2015-08-01 MED ORDER — ONDANSETRON HCL 4 MG PO TABS
4.0000 mg | ORAL_TABLET | Freq: Three times a day (TID) | ORAL | Status: DC | PRN
Start: 2015-08-01 — End: 2015-08-01

## 2015-08-01 MED ORDER — GABAPENTIN 100 MG PO CAPS
100.0000 mg | ORAL_CAPSULE | Freq: Three times a day (TID) | ORAL | Status: DC
  Administered 2015-08-02 – 2015-08-09 (×23): 100 mg via ORAL
  Filled 2015-08-01 (×30): qty 1

## 2015-08-01 MED ORDER — TRAZODONE HCL 50 MG PO TABS: 150.0000 mg | ORAL_TABLET | Freq: Every day | ORAL | Status: DC

## 2015-08-01 MED ORDER — LORAZEPAM 1 MG PO TABS: 1.0000 mg | ORAL_TABLET | Freq: Three times a day (TID) | ORAL | Status: DC | PRN

## 2015-08-01 MED ORDER — ACETAMINOPHEN 325 MG PO TABS: 650.0000 mg | ORAL_TABLET | ORAL | Status: DC | PRN

## 2015-08-01 MED ORDER — ACETAMINOPHEN 325 MG PO TABS: 650.0000 mg | ORAL_TABLET | Freq: Four times a day (QID) | ORAL | Status: DC | PRN

## 2015-08-01 MED ORDER — HYDROXYZINE HCL 25 MG PO TABS: 50.0000 mg | ORAL_TABLET | Freq: Four times a day (QID) | ORAL | Status: DC | PRN

## 2015-08-01 MED ORDER — DIVALPROEX SODIUM ER 250 MG PO TB24
750.0000 mg | ORAL_TABLET | Freq: Two times a day (BID) | ORAL | Status: DC
  Administered 2015-08-01: 750 mg via ORAL
  Filled 2015-08-01 (×3): qty 1

## 2015-08-01 MED ORDER — ALUM & MAG HYDROXIDE-SIMETH 200-200-20 MG/5ML PO SUSP: 30.0000 mL | ORAL | Status: DC | PRN

## 2015-08-01 MED ORDER — OLANZAPINE 10 MG PO TABS
10.0000 mg | ORAL_TABLET | Freq: Every day | ORAL | Status: DC
  Administered 2015-08-02 – 2015-08-08 (×8): 10 mg via ORAL
  Filled 2015-08-01 (×11): qty 1

## 2015-08-01 MED ORDER — HYDROXYZINE HCL 50 MG PO TABS
50.0000 mg | ORAL_TABLET | Freq: Four times a day (QID) | ORAL | Status: DC | PRN
  Administered 2015-08-02 – 2015-08-04 (×4): 50 mg via ORAL
  Filled 2015-08-01 (×4): qty 1

## 2015-08-01 MED ORDER — DIVALPROEX SODIUM ER 500 MG PO TB24
750.0000 mg | ORAL_TABLET | Freq: Two times a day (BID) | ORAL | Status: DC
  Filled 2015-08-01 (×3): qty 1

## 2015-08-01 MED ORDER — BUPROPION HCL 75 MG PO TABS
75.0000 mg | ORAL_TABLET | Freq: Two times a day (BID) | ORAL | Status: DC
  Administered 2015-08-02 – 2015-08-04 (×6): 75 mg via ORAL
  Filled 2015-08-01 (×10): qty 1

## 2015-08-01 NOTE — ED Notes (Signed)
Pt transported to [facility] by [facility] transportation service for continuation of specialized care. Belongings given to driver after patient signed for them. Pt left in no acute distress. 

## 2015-08-01 NOTE — ED Notes (Addendum)
Pt hx bipolar, depression, and alcoholism, pt has a mental health guardian. Pt brought into ED by ACT team, [facility]. Pt reports SI thoughts for 3 days, ideas of "slashing wrist, OD on pills, walking in front of a truck". Pt has attempted self harm in the past by OD, strangling herself, and slashing wrist. Last attempt in Aug 2016. Pt reports she last drank ETOH 2 days ago. Pt contracts for safety. Pt denies pain.   Denies HI, AH/VH  ACT team member, Jens SomJohn Brown, [phone], crisis line [phone]

## 2015-08-01 NOTE — ED Notes (Signed)
Attempted to call report unable to do so at this time. Receiving nurse passing medication per unit secretary.

## 2015-08-01 NOTE — ED Notes (Signed)
Patient tearful on approach.  States she was discharged from [facility] earlier today.  States he counselors did not feel she was ready to be discharged and brought her here.  States she was sober for about 2 weeks after last hospital discharge.  Has been to some sporadic AA meetings.  States she wants help with depression and wants to stop drinking.  TTS assessment completed on arrival.  Call from [name] ACT team.  They can be reached at [phone] or the after hours number of [phone].  Patient is currently resting comfortably.
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
## 2015-08-01 NOTE — ED Provider Notes (Signed)
CSN: [id number]     Arrival date & time 08/01/15  1557 History   First MD Initiated Contact with Patient 08/01/15 1708     Chief Complaint  Patient presents with  . Suicidal     (Consider location/radiation/quality/duration/timing/severity/associated sxs/prior Treatment) HPI Comments: Mary Harper is a 49 y.o. female with a PMHx of depression, bipolar 1 disorder, alcoholism, PTSD, and anxiety with a PSHx of gastric bypass surgery, who presents to the ED with complaints of suicidal ideations with a plan to "slashing her wrists, OD on pills, strangling herself, or walking in front of a truck". Patient states that these thoughts have been ongoing for about 3 days. She states that she had a good Christmas with family, but upon returning home she felt lonely, and drink alcohol 2 days ago which seemed to worsen her symptoms. She states she drank "a lot of vodka", unsure of exact amount. Endorses associated anxiety. She is compliant with her psychiatric medications, last took them at 1 PM. She denies any AVH/HI, illicit drug use, or smoking. She has no medical complaints. She is accompanied here today by her ACT team member. +Prior suicide attempts. +Prior admissions for psych issues  Patient is a 49 y.o. female presenting with mental health disorder. The history is provided by the patient. No language interpreter was used.  Mental Health Problem Presenting symptoms: depression and suicidal thoughts   Presenting symptoms: no hallucinations and no homicidal ideas   Patient accompanied by: ACT team member. Onset quality:  Gradual Duration:  3 days Timing:  Constant Progression:  Unchanged Chronicity:  Recurrent Context: alcohol use   Treatment compliance:  All of the time Time since last psychoactive medication taken:  4 hours Relieved by:  None tried Worsened by:  Alcohol Ineffective treatments:  None tried Associated symptoms: anxiety   Associated symptoms: no abdominal pain and no chest pain    Risk factors: hx of mental illness and hx of suicide attempts     Past Medical History  Diagnosis Date  . Depression   . Bipolar 1 disorder   . Alcoholism   . PTSD (post-traumatic stress disorder)   . Carpal tunnel syndrome on both sides   . Gastric bypass status for obesity   . Anxiety    Past Surgical History  Procedure Laterality Date  . Gastric bypass     Family History  Problem Relation Age of Onset  . Alcoholism Other     many relatives on mother side with alcohol abuse issues   Social History  Substance Use Topics  . Smoking status: Never Smoker   . Smokeless tobacco: Never Used  . Alcohol Use: Yes     Comment: Binge Drinking    OB History    Gravida Para Term Preterm AB TAB SAB Ectopic Multiple Living   0 0 0 0 0 0 0 0       Review of Systems  Constitutional: Negative for fever and chills.  Respiratory: Negative for shortness of breath.   Cardiovascular: Negative for chest pain.  Gastrointestinal: Negative for nausea, vomiting, abdominal pain, diarrhea and constipation.  Genitourinary: Negative for dysuria and hematuria.  Musculoskeletal: Negative for myalgias and arthralgias.  Skin: Negative for color change.  Allergic/Immunologic: Negative for immunocompromised state.  Neurological: Negative for weakness and numbness.  Psychiatric/Behavioral: Positive for suicidal ideas. Negative for homicidal ideas, hallucinations and confusion. The patient is nervous/anxious.    10 Systems reviewed and are negative for acute change except as noted in the HPI.  Allergies  Horse-derived products and Lactose intolerance (gi)  Home Medications   Prior to Admission medications   Medication Sig Start Date End Date Taking? Authorizing Provider  citalopram (CELEXA) 10 MG tablet Take 10 mg by mouth 2 (two) times daily. Not yet started 04/17/15 05/17/15  Historical Provider, MD  cyanocobalamin (,VITAMIN B-12,) 1000 MCG/ML injection Inject 1 mL (1,000 mcg total) into the  muscle once a week. 12/05/14   Jimmy Footman, MD  cyclobenzaprine (FLEXERIL) 5 MG tablet Take 1 tablet (5 mg total) by mouth 3 (three) times daily as needed for muscle spasms. 12/05/14   Jimmy Footman, MD  divalproex (DEPAKOTE ER) 250 MG 24 hr tablet Take 3 tablets (750 mg total) by mouth 2 (two) times daily. 03/21/15   Adonis Brook, NP  eszopiclone 3 MG TABS Take 1 tablet (3 mg total) by mouth at bedtime. Take immediately before bedtime 03/21/15   Adonis Brook, NP  gabapentin (NEURONTIN) 100 MG capsule Take 1 capsule (100 mg total) by mouth 3 (three) times daily. 03/21/15   Adonis Brook, NP  hydrOXYzine (ATARAX/VISTARIL) 50 MG tablet Take 1 tablet (50 mg total) by mouth every 6 (six) hours as needed for anxiety. 03/21/15   Adonis Brook, NP  LORazepam (ATIVAN) 1 MG tablet Take 1 mg (one tablet) Twice a day for four days then Take 1 mg ( one tablet) once a day for the next four days. 03/21/15   Adonis Brook, NP  prazosin (MINIPRESS) 1 MG capsule Take 1 capsule (1 mg total) by mouth at bedtime. 12/05/14   Jimmy Footman, MD  primidone (MYSOLINE) 50 MG tablet Take 50 mg by mouth 3 (three) times daily. Not yet started 04/17/15 05/17/15  Historical Provider, MD  traZODone (DESYREL) 150 MG tablet Take 1 tablet (150 mg total) by mouth at bedtime. 03/21/15   Adonis Brook, NP   BP 126/99 mmHg  Pulse 77  Temp(Src) 98.1 F (36.7 C) (Oral)  Resp 16  SpO2 100%  LMP 07/18/2015 (Approximate) Physical Exam  Constitutional: She is oriented to person, place, and time. Vital signs are normal. She appears well-developed and well-nourished.  Non-toxic appearance. No distress.  Afebrile, nontoxic, NAD  HENT:  Head: Normocephalic and atraumatic.  Mouth/Throat: Oropharynx is clear and moist and mucous membranes are normal.  Eyes: Conjunctivae and EOM are normal. Right eye exhibits no discharge. Left eye exhibits no discharge.  Neck: Normal range of motion. Neck supple.   Cardiovascular: Normal rate, regular rhythm, normal heart sounds and intact distal pulses.  Exam reveals no gallop and no friction rub.   No murmur heard. Pulmonary/Chest: Effort normal and breath sounds normal. No respiratory distress. She has no decreased breath sounds. She has no wheezes. She has no rhonchi. She has no rales.  Abdominal: Soft. Normal appearance and bowel sounds are normal. She exhibits no distension. There is no tenderness. There is no rigidity, no rebound, no guarding and no CVA tenderness.  Musculoskeletal: Normal range of motion.  Neurological: She is alert and oriented to person, place, and time. She has normal strength. No sensory deficit.  Skin: Skin is warm, dry and intact. No rash noted.  Psychiatric: Her mood appears anxious. She is not actively hallucinating. She exhibits a depressed mood. She expresses suicidal ideation. She expresses no homicidal ideation. She expresses suicidal plans. She expresses no homicidal plans.  Anxious appearing, depressed affect, endorsing SI with a plan, no HI/AVH  Nursing note and vitals reviewed.   ED Course  Procedures (including critical care time)  Labs Review Labs Reviewed  COMPREHENSIVE METABOLIC PANEL - Abnormal; Notable for the following:    Chloride 97 (*)    Glucose, Bld 101 (*)    Calcium 8.7 (*)    Total Protein 6.4 (*)    All other components within normal limits  ACETAMINOPHEN LEVEL - Abnormal; Notable for the following:    Acetaminophen (Tylenol), Serum <10 (*)    All other components within normal limits  URINE RAPID DRUG SCREEN, HOSP PERFORMED - Abnormal; Notable for the following:    Benzodiazepines POSITIVE (*)    Barbiturates POSITIVE (*)    All other components within normal limits  ETHANOL  SALICYLATE LEVEL  CBC  POC URINE PREG, ED    Imaging Review No results found. I have personally reviewed and evaluated these images and lab results as part of my medical decision-making.   EKG  Interpretation None      MDM   Final diagnoses:  Suicidal ideations  Alcohol dependence, binge pattern (HCC)  Anxiety    49 y.o. female here with SI with a plan, some anxiety. EtOH use 2 days ago. No other complaints. No HI/AVH. Will get clearance labs and TTS consult. Upreg neg. Psych hold orders placed, home meds reordered based on what's in the system but pt states she has some with her that need to be put into computer, will have med rec done. Will reassess after labs.  5:53 PM Labs unremarkable aside from UDS showing benzos and barbituates. Pt medically cleared. [facility] of care. Holding orders placed.  BP 126/99 mmHg  Pulse 77  Temp(Src) 98.1 F (36.7 C) (Oral)  Resp 16  SpO2 100%  LMP 07/18/2015 (Approximate)  Meds ordered this encounter  Medications  . cyclobenzaprine (FLEXERIL) tablet 5 mg    Sig:   . divalproex (DEPAKOTE ER) 24 hr tablet 750 mg    Sig:   . gabapentin (NEURONTIN) capsule 100 mg    Sig:   . hydrOXYzine (ATARAX/VISTARIL) tablet 50 mg    Sig:   . traZODone (DESYREL) tablet 150 mg    Sig:   . alum & mag hydroxide-simeth (MAALOX/MYLANTA) 200-200-20 MG/5ML suspension 30 mL    Sig:   . ondansetron (ZOFRAN) tablet 4 mg    Sig:   . ibuprofen (ADVIL,MOTRIN) tablet 600 mg    Sig:   . acetaminophen (TYLENOL) tablet 650 mg    Sig:   . LORazepam (ATIVAN) tablet 1 mg    Sig:      Allen DerryMercedes Camprubi-Soms, PA-C 08/01/15 1754  Lavera Guiseana Duo Liu, MD 08/02/15 [phone]

## 2015-08-01 NOTE — [facility] Counselor (Signed)
[facility] Assessment Progress Note  [facility] She can come at 2100. Pt's nurse, Dawnaly, notified.   Johny ShockSamantha M. Ladona Ridgelaylor, MS, NCC, LPCA Counselor

## 2015-08-01 NOTE — ED Notes (Signed)
[facility]. Patient denies HI and AVH at this time. Encouragement and support provided and safety maintain. Q 15 min safety checks remain in place.

## 2015-08-01 NOTE — Progress Notes (Signed)
Patient ID: Mary Harper Basic, female   DOB: 07-30-1966, 49 y.o.   MRN: [id number] Per State regulations 482.30 this chart was reviewed for medical necessity with respect to the patient's admission/duration of stay.    Next review date: 08/06/15  Thurman CoyerEric Lindsay Soulliere, BSN, RN-BC  Case Manager

## 2015-08-02 ENCOUNTER — Encounter (HOSPITAL_COMMUNITY): Payer: Self-pay | Admitting: Psychiatry

## 2015-08-02 DIAGNOSIS — F314 Bipolar disorder, current episode depressed, severe, without psychotic features: Principal | ICD-10-CM

## 2015-08-02 DIAGNOSIS — R45851 Suicidal ideations: Secondary | ICD-10-CM

## 2015-08-02 MED ORDER — IBUPROFEN 600 MG PO TABS
600.0000 mg | ORAL_TABLET | Freq: Four times a day (QID) | ORAL | Status: DC | PRN
  Administered 2015-08-03 – 2015-08-09 (×6): 600 mg via ORAL
  Filled 2015-08-02 (×7): qty 1

## 2015-08-02 MED ORDER — DIVALPROEX SODIUM ER 500 MG PO TB24
750.0000 mg | ORAL_TABLET | Freq: Two times a day (BID) | ORAL | Status: DC
  Administered 2015-08-03 – 2015-08-09 (×13): 750 mg via ORAL
  Filled 2015-08-02 (×17): qty 1

## 2015-08-02 MED ORDER — LORAZEPAM 1 MG PO TABS
1.0000 mg | ORAL_TABLET | ORAL | Status: DC | PRN
  Administered 2015-08-02 – 2015-08-09 (×17): 1 mg via ORAL
  Filled 2015-08-02 (×18): qty 1

## 2015-08-02 NOTE — Progress Notes (Signed)
Admission Note  Mary Harper is 49y/o female admitted to the I/P 300 adult complaining of severe depression and anxiety. Pt also endorses substance abuse and SI. She states, "My depression has increased my alcohol use; the more I drank the more I felt like doing something to myself; I am very anxious." Pt also endorses moderate headache. Pt is very flat and makes minimal eye contact. Pt has multiple scars, mostly from several falls in the past year (high fall risk). Pt stated goals are "to get help with my anxiety" and "I want to stop wanting to kill myself." Pt denies HI and AVH. Pt was cooperative and appropriate through the assessment. Support, encouragement, and safe environment provided. 15-minute safety checks initiated and continued.

## 2015-08-02 NOTE — Plan of Care (Signed)
Problem: Alteration in mood Goal: STG-Patient reports thoughts of self-harm to staff Outcome: Progressing Mary Harper reports thoughts of self harm to staff, able to contract for safety.
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
## 2015-08-02 NOTE — Tx Team (Signed)
Initial Interdisciplinary Treatment Plan   PATIENT STRESSORS: Medication change or noncompliance Substance abuse   PATIENT STRENGTHS: Communication skills Special hobby/interest Supportive family/friends   PROBLEM LIST: Problem List/Patient Goals Date to be addressed Date deferred Reason deferred Estimated date of resolution  "Help with my anxiety" 08/01/15     "I want to stop wanting to kill myself" 08/01/15     Risk for suicide 08/01/15     depression 08/01/15     Anxiety 08/01/15     Substance Abuse 08/01/15                        DISCHARGE CRITERIA:  Ability to meet basic life and health needs Adequate post-discharge living arrangements Verbal commitment to aftercare and medication compliance Withdrawal symptoms are absent or subacute and managed without 24-hour nursing intervention  PRELIMINARY DISCHARGE PLAN: Outpatient therapy Return to previous living arrangement  PATIENT/FAMIILY INVOLVEMENT: This treatment plan has been presented to and reviewed with the patient, Mary Harper, and/or family member.  The patient and family have been given the opportunity to ask questions and make suggestions.  Daryana Whirley T Cheryal Salas 08/02/2015, 12:49 AM

## 2015-08-02 NOTE — Progress Notes (Signed)
Patient ID: Mary Harper, female   DOB: 04-Apr-1966, 49 y.o.   MRN: [id number]  DAR: Pt. Denies HI and A/V Hallucinations. She does report passive SI but is able to contract for safety. Patient does report a headache this morning however reports later on that it had gone away. She reports sleep was poor last night, appetite is poor, energy level is low, and concentration is poor. She rates depression, anxiety, and hopelessness 9/10. PRN Ativan administered to patient in order to decrease anxiety. Support and encouragement provided to the patient. Scheduled medications administered to patient per physician's orders. Patient remains isolative mostly to her room. She was able to take a shower this afternoon and now appears well groomed. She does report withdrawal symptoms including tremors, anxiety, and malaise. Q15 minute checks are maintained for safety.

## 2015-08-02 NOTE — BHH Group Notes (Signed)
[place] LCSW Group Therapy  08/02/2015 1:06 PM  Type of Therapy:  Group Therapy  Participation Level:  Active  Participation Quality:  Attentive  Affect:  Anxious, Depressed and Tearful  Cognitive:  Oriented  Insight:  Improving  Engagement in Therapy:  Improving  Modes of Intervention:  Confrontation, Discussion, Education, Exploration, Problem-solving, Rapport Building, Socialization and Support  Summary of Progress/Problems: Feelings around Relapse. Group members discussed the meaning of relapse and shared personal stories of relapse, how it affected them and others, and how they perceived themselves during this time. Group members were encouraged to identify triggers, warning signs and coping skills used when facing the possibility of relapse. Social supports were discussed and explored in detail. Post Acute Withdrawal Syndrome (handout provided) was introduced and examined. Pt's were encouraged to ask questions, talk about key points associated with PAWS, and process this information in terms of relapse prevention. Mary Harper was attentive and engaged during group. She was tearful throughout group, stating that she recently moved into apartment and relapsed due to main trigger of "extreme loneliness and anxiety." Mary Harper states that she struggles with persistent thoughts of SI. She shared that this month is hard for her due to the holidays. "I need more to do outside of my apt." Mary Harper was able to identify AA as a source of "excellent support. I've met some female friends in my home group that take to me to meetings."   Smart, Mary Conn LCSW 08/02/2015, 1:06 PM
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
## 2015-08-02 NOTE — [facility] Suicide Risk Assessment (Signed)
[facility] INPATIENT:  Family/Significant Other Suicide Prevention Education  Suicide Prevention Education:  Patient Refusal for Family/Significant Other Suicide Prevention Education: The patient Mary Harper has refused to provide written consent for family/significant other to be provided Family/Significant Other Suicide Prevention Education during admission and/or prior to discharge.  Physician notified.  SPE completed with pt, as pt refused to consent to family contact. SPI pamphlet provided to pt and pt was encouraged to share information with support network, ask questions, and talk about any concerns relating to SPE. Pt denies access to guns/firearms and verbalized understanding of information provided. Mobile Crisis information also provided to pt.   Smart, Monifa Blanchette LCSW 08/02/2015, 3:34 PM

## 2015-08-02 NOTE — Progress Notes (Signed)
CSW left message for Archie Pattenonya ([employer]) case worker at [phone] in attempt to notify her of pt's hospitalization and discuss aftercare plan. CSW requested call back at her earliest convenience.  Trula SladeHeather Smart, MSW, LCSW Clinical Social Worker 08/02/2015 3:28 PM

## 2015-08-03 NOTE — [facility] Group Notes (Signed)
Adult Psychoeducational Group Note  Date:  08/03/2015 Time:  11:50 PM  Group Topic/Focus:  Wrap-Up Group:   The focus of this group is to help patients review their daily goal of treatment and discuss progress on daily workbooks.  Participation Level:  Minimal  Participation Quality:  Appropriate  Affect:  Flat  Cognitive:  Appropriate  Insight: Good  Engagement in Group:  Engaged  Modes of Intervention:  Discussion  Additional Comments:  Pt attended with minimal participation during wrap-up group.  Kylin Genna T Cyndie Woodbeck 08/03/2015, 11:50 PM

## 2015-08-03 NOTE — BHH Group Notes (Addendum)
B[facility] Group Notes:  (Clinical Social Work)   08/03/2015      10:00-11:00AM  Summary of Progress/Problems:   In today's process group a decisional balance exercise was used to explore in depth the perceived benefits and costs of unhealthy coping techniques, as well as the  benefits and costs of replacing these with healthy coping skills.  Among the unhealthy coping techniques listed by the group were drinking, isolating, overtaking pain medications, directing anger inward, trying to control everything, stopping medications, denying grief, rumination, and anger outbursts.  Motivational Interviewing and the whiteboard were utilized for the exercises.  The patient displayed flat affect and had very little interaction during the group, no eye contact with CSW who is known to her.  She did state her downfall after her last discharge from the hospital was isolating herself and not filling her life with a schedule of activities.  She stated she therefore became very lonely and depressed.  Type of Therapy:  Group Therapy - Process   Participation Level:  Active  Participation Quality:  Attentive  Affect:  Depressed and Flat  Cognitive:  Oriented  Insight:  Improving  Engagement in Therapy:  Engaged  Modes of Intervention:  Education, Motivational Interviewing  Ambrose MantleMareida Grossman-Orr, LCSW 08/03/2015, 11:51 AM

## 2015-08-03 NOTE — Progress Notes (Signed)
Patient ID: Mary Harper, female   DOB: Dec 23, 1965, 49 y.o.   MRN: [id number]   D: Pt has been very flat and depressed on the unit. Pt has also been very anxious and has required medication every six hours for anxiety. Pt reported that her depression was a 9, her hopelessness was a 9, and her anxiety was a 9. Pt reported that she was positive SI, but could contract for safety. Pt reported being negative HI, no AH/VH noted. Pt reported that her goal for today was to stop the anxiety and SI. A: 15 min checks continued for patient safety. R: Pt safety maintained.

## 2015-08-03 NOTE — Progress Notes (Signed)
Patient ID: Mary Harper, female   DOB: 20-Mar-1966, 49 y.o.   MRN: [id number] D: Client spent most of the shift in bed, reports upon waking she was admitted due to ongoing suicidal thoughts and anxiety, did not elaborate. Client does contract for safety.  A: Writer provided emotional support, reviewed and administered medication as ordered. Staff will monitor q5415min for safety. R: Client is safe on the unit, did not attend group.

## 2015-08-03 NOTE — Progress Notes (Signed)
[facility] MD Progress Note  08/03/2015 3:48 PM Mary Harper  MRN:  [id number] Subjective:  Mary Harper continues to have a hard time. Still very depressed anxious with suicidal ruminations. A sense of hopelessness helplessness. Admits she is getting some relief from the Ativan. States she cant stop thinking of how is she going to deal with herself when she goes back home Principal Problem: Bipolar affective disorder, depressed, severe ([place]) Diagnosis:   Patient Active Problem List   Diagnosis Date Noted  . Suicidal ideations [R45.851] 10/03/2014    Priority: High  . Bipolar affective disorder, depressed, severe ([place]) [F31.4] 07/13/2014    Priority: High  . PTSD (post-traumatic stress disorder) [F43.10] 03/12/2015  . Alcohol use disorder, severe, dependence ([place]) [F10.20] 12/02/2014  . Bipolar I disorder, most recent episode depressed ([place]) [F31.30] 12/02/2014  . Alcohol dependence, binge pattern ([place]) [F10.20] 07/14/2014  . Alcohol intoxication ([place]) [F10.129]    Total Time spent with patient: 20 minutes  Past Psychiatric History: see admission H and P  Past Medical History:  Past Medical History  Diagnosis Date  . Depression   . Bipolar 1 disorder ([place])   . Alcoholism ([place])   . PTSD (post-traumatic stress disorder)   . Carpal tunnel syndrome on both sides   . Gastric bypass status for obesity   . Anxiety     Past Surgical History  Procedure Laterality Date  . Gastric bypass     Family History:  Family History  Problem Relation Age of Onset  . Alcoholism Other     many relatives on mother side with alcohol abuse issues   Family Psychiatric  History: see Admission H and P Social History:  History  Alcohol Use  . Yes    Comment: Binge Drinking      History  Drug Use No    Comment: binge drink about every 3 weeks     Social History   Social History  . Marital Status: Single    Spouse Name: N/A  . Number of Children: N/A  . Years of Education: N/A   Social History Main  Topics  . Smoking status: Never Smoker   . Smokeless tobacco: Never Used  . Alcohol Use: Yes     Comment: Binge Drinking   . Drug Use: No     Comment: binge drink about every 3 weeks   . Sexual Activity: Not Currently   Other Topics Concern  . None   Social History Narrative   ** Merged History Encounter **       Additional Social History:                         Sleep: Poor  Appetite:  Poor  Current Medications: Current Facility-Administered Medications  Medication Dose Route Frequency Provider Last Rate Last Dose  . acetaminophen (TYLENOL) tablet 650 mg  650 mg Oral Q6H PRN Harriet Butte, NP      . alum & mag hydroxide-simeth (MAALOX/MYLANTA) 200-200-20 MG/5ML suspension 30 mL  30 mL Oral Q4H PRN Harriet Butte, NP      . buPROPion [facility]) tablet 75 mg  75 mg Oral BID Harriet Butte, NP   75 mg at 08/03/15 1008  . divalproex (DEPAKOTE ER) 24 hr tablet 750 mg  750 mg Oral BID Harriet Butte, NP   750 mg at 08/03/15 1008  . gabapentin (NEURONTIN) capsule 100 mg  100 mg Oral TID Harriet Butte, NP   100  mg at 08/03/15 1156  . hydrOXYzine (ATARAX/VISTARIL) tablet 50 mg  50 mg Oral Q6H PRN Harriet Butte, NP   50 mg at 08/03/15 1008  . ibuprofen (ADVIL,MOTRIN) tablet 600 mg  600 mg Oral Q6H PRN Encarnacion Slates, NP   600 mg at 08/03/15 1518  . LORazepam (ATIVAN) tablet 1 mg  1 mg Oral Q4H PRN Nicholaus Bloom, MD   1 mg at 08/03/15 1518  . magnesium hydroxide (MILK OF MAGNESIA) suspension 30 mL  30 mL Oral Daily PRN Harriet Butte, NP      . OLANZapine (ZYPREXA) tablet 10 mg  10 mg Oral QHS Harriet Butte, NP   10 mg at 08/02/15 2333  . traZODone (DESYREL) tablet 150 mg  150 mg Oral QHS Harriet Butte, NP   150 mg at 08/02/15 2333    Lab Results:  Results for orders placed or performed during the hospital encounter of 08/01/15 (from the past 48 hour(s))  Urine rapid drug screen (hosp performed) (Not at [facility])     Status: Abnormal   Collection Time: 08/01/15   5:09 PM  Result Value Ref Range   Opiates NONE DETECTED NONE DETECTED   Cocaine NONE DETECTED NONE DETECTED   Benzodiazepines POSITIVE (A) NONE DETECTED   Amphetamines NONE DETECTED NONE DETECTED   Tetrahydrocannabinol NONE DETECTED NONE DETECTED   Barbiturates POSITIVE (A) NONE DETECTED    Comment:        DRUG SCREEN FOR MEDICAL PURPOSES ONLY.  IF CONFIRMATION IS NEEDED FOR ANY PURPOSE, NOTIFY LAB WITHIN 5 DAYS.        LOWEST DETECTABLE LIMITS FOR URINE DRUG SCREEN Drug Class       Cutoff (ng/mL) Amphetamine      1000 Barbiturate      200 Benzodiazepine   456 Tricyclics       256 Opiates          300 Cocaine          300 THC              50   Comprehensive metabolic panel     Status: Abnormal   Collection Time: 08/01/15  5:10 PM  Result Value Ref Range   Sodium 135 135 - 145 mmol/L   Potassium 3.8 3.5 - 5.1 mmol/L   Chloride 97 (L) 101 - 111 mmol/L   CO2 24 22 - 32 mmol/L   Glucose, Bld 101 (H) 65 - 99 mg/dL   BUN 13 6 - 20 mg/dL   Creatinine, Ser 0.52 0.44 - 1.00 mg/dL   Calcium 8.7 (L) 8.9 - 10.3 mg/dL   Total Protein 6.4 (L) 6.5 - 8.1 g/dL   Albumin 3.5 3.5 - 5.0 g/dL   AST 26 15 - 41 U/L   ALT 14 14 - 54 U/L   Alkaline Phosphatase 64 38 - 126 U/L   Total Bilirubin 0.9 0.3 - 1.2 mg/dL   GFR calc non Af Amer >60 >60 mL/min   GFR calc Af Amer >60 >60 mL/min    Comment: (NOTE) The eGFR has been calculated using the CKD EPI equation. This calculation has not been validated in all clinical situations. eGFR's persistently <60 mL/min signify possible Chronic Kidney Disease.    Anion gap 14 5 - 15  Ethanol (ETOH)     Status: None   Collection Time: 08/01/15  5:10 PM  Result Value Ref Range   Alcohol, Ethyl (B) <5 <5 mg/dL    Comment:  LOWEST DETECTABLE LIMIT FOR SERUM ALCOHOL IS 5 mg/dL FOR MEDICAL PURPOSES ONLY   Salicylate level     Status: None   Collection Time: 08/01/15  5:10 PM  Result Value Ref Range   Salicylate Lvl <2.2 2.8 - 30.0 mg/dL   Acetaminophen level     Status: Abnormal   Collection Time: 08/01/15  5:10 PM  Result Value Ref Range   Acetaminophen (Tylenol), Serum <10 (L) 10 - 30 ug/mL    Comment:        THERAPEUTIC CONCENTRATIONS VARY SIGNIFICANTLY. A RANGE OF 10-30 ug/mL MAY BE AN EFFECTIVE CONCENTRATION FOR MANY PATIENTS. HOWEVER, SOME ARE BEST TREATED AT CONCENTRATIONS OUTSIDE THIS RANGE. ACETAMINOPHEN CONCENTRATIONS >150 ug/mL AT 4 HOURS AFTER INGESTION AND >50 ug/mL AT 12 HOURS AFTER INGESTION ARE OFTEN ASSOCIATED WITH TOXIC REACTIONS.   CBC     Status: None   Collection Time: 08/01/15  5:10 PM  Result Value Ref Range   WBC 8.1 4.0 - 10.5 K/uL   RBC 4.15 3.87 - 5.11 MIL/uL   Hemoglobin 13.3 12.0 - 15.0 g/dL   HCT 40.2 36.0 - 46.0 %   MCV 96.9 78.0 - 100.0 fL   MCH 32.0 26.0 - 34.0 pg   MCHC 33.1 30.0 - 36.0 g/dL   RDW 13.6 11.5 - 15.5 %   Platelets 276 150 - 400 K/uL  POC urine preg, ED (not at [facility])     Status: None   Collection Time: 08/01/15  5:16 PM  Result Value Ref Range   Preg Test, Ur NEGATIVE NEGATIVE    Comment:        THE SENSITIVITY OF THIS METHODOLOGY IS >24 mIU/mL     Physical Findings: AIMS: Facial and Oral Movements Muscles of Facial Expression: None, normal Lips and Perioral Area: None, normal Jaw: None, normal Tongue: None, normal,Extremity Movements Upper (arms, wrists, hands, fingers): None, normal Lower (legs, knees, ankles, toes): None, normal, Trunk Movements Neck, shoulders, hips: None, normal, Overall Severity Severity of abnormal movements (highest score from questions above): None, normal Incapacitation due to abnormal movements: None, normal Patient's awareness of abnormal movements (rate only patient's report): No Awareness, Dental Status Current problems with teeth and/or dentures?: No Does patient usually wear dentures?: No  CIWA:  CIWA-Ar Total: 1 COWS:     Musculoskeletal: Strength & Muscle Tone: within normal limits Gait & Station:  normal Patient leans: normal  Psychiatric Specialty Exam: Review of Systems  Constitutional: Positive for malaise/fatigue.  HENT: Negative.   Eyes: Negative.   Respiratory: Negative.   Cardiovascular: Negative.   Gastrointestinal: Negative.   Genitourinary: Negative.   Musculoskeletal: Positive for myalgias.  Skin: Negative.   Neurological: Positive for weakness.  Endo/Heme/Allergies: Negative.   Psychiatric/Behavioral: Positive for depression, suicidal ideas and substance abuse. The patient is nervous/anxious and has insomnia.     Blood pressure 106/66, pulse 117, temperature 97.9 F (36.6 C), temperature source Oral, resp. rate 16, height _0  (1.651 m), weight 84.823 kg (187 lb), last menstrual period 07/18/2015, SpO2 100 %.Body mass index is 31.12 kg/(m^2).  General Appearance: Fairly Groomed  Engineer, water::  Fair  Speech:  Clear and Coherent  Volume:  Decreased  Mood:  Anxious and Depressed  Affect:  Depressed  Thought Process:  Coherent and Goal Directed  Orientation:  Full (Time, Place, and Person)  Thought Content:  symptoms events worries concerns  Suicidal Thoughts:  Yes.  without intent/plan  Homicidal Thoughts:  No  Memory:  Immediate;   Fair Recent;  Fair Remote;   Fair  Judgement:  Fair  Insight:  Present and Shallow  Psychomotor Activity:  Restlessness  Concentration:  Fair  Recall:  [place]  Language: Fair  Akathisia:  No  Handed:  Right  AIMS (if indicated):     Assets:  Desire for Improvement Housing  ADL's:  Intact  Cognition: WNL  Sleep:  Number of Hours: 5.5   Treatment Plan Summary: Daily contact with patient to assess and evaluate symptoms and progress in treatment and Medication management Supportive approach/coping skills Alcohol dependence; continue to monitor for withdrawal Depression; continue the Wellbutrin and increase to 150 mg XL Mood instability; will continue the Depakote and the Zyprexa Acute anxiety;  will continue to use the Ativan to help with the acute anxiety Use CBT/mindfulness She is able and willing to contract for safety Emory Gallentine A 08/03/2015, 3:48 PM

## 2015-08-03 NOTE — [facility] Group Notes (Signed)
Pt attended and participated during wrap-up group.

## 2015-08-03 NOTE — Plan of Care (Signed)
Problem: Diagnosis: Increased Risk For Suicide Attempt Goal: STG-Patient Will Report Suicidal Feelings to Staff Outcome: Progressing Client reports suicidal ideation, with no plan. Client is safe on the unit AEB q3915min safety checks and contracts for safety.

## 2015-08-03 NOTE — Progress Notes (Signed)
D.  Pt flat but pleasant on approach, denies complaints at this time.  Positive for evening wrap up group, minimal interaction on unit.  Continues to endorse passive suicidal ideation but contracts for safety on the unit.  Denies HI/hallucinations at this time.  A.  Support and encouragement offered, medication given as ordered  R.  Pt remains safe on the unit, will continue to monitor.

## 2015-08-03 NOTE — B[facility] Group Notes (Signed)
[facility] Group Notes:  (Nursing/MHT/Case Management/Adjunct)  Date:  08/03/2015  Time:  2:32 PM  Type of Therapy:  Psychoeducational Skills  Participation Level:  Did Not Attend  Participation Quality:  Did Not Attend  Affect:  Did Not Attend  Cognitive:  Did Not Attend  Insight:  None  Engagement in Group:  Did Not Attend  Modes of Intervention:  Did Not Attend  Summary of Progress/Problems: Pt did not attend patient self inventory group.   Keiera Strathman Shanta 08/03/2015, 2:32 PM 

## 2015-08-04 MED ORDER — BUPROPION HCL 100 MG PO TABS
100.0000 mg | ORAL_TABLET | Freq: Two times a day (BID) | ORAL | Status: DC
  Administered 2015-08-05 – 2015-08-09 (×9): 100 mg via ORAL
  Filled 2015-08-04 (×15): qty 1

## 2015-08-04 NOTE — Progress Notes (Signed)
Nursing Note : Nursing Progress Note: 7-7p  D- Mood is depressed and anxious,rates anxiety at 5/10. Affect is blunted and appropriate. Pt is able to contract for safety. Reports appetite is fair. Goal for today is lessen thoughts of suicide and her anxiety. Reports passive S/I.  A - Observed pt minmally interacting in group and in the milieu. Pt tends to self isolate in room.Support and encouragement offered, safety maintained with q 15 minutes.Marland Kitchen.  R-Contracts for safety and continues to follow treatment plan, working on learning new coping skills.

## 2015-08-04 NOTE — B[facility] Group Notes (Signed)
[facility] Group Notes:  (Clinical Social Work)  08/04/2015  10:00-11:00AM  Summary of Progress/Problems:   The main focus of today's process group was to   1)  discuss possible resolutions for the new year  2)  Identify supports that would be needed   An emphasis was placed on using professional supports and 12-step groups.  The patient expressed that her goal for the new year is to get more activities into her life so that she does not become bored and depressed.  She also wants to work with a sponsor.  She is working with an Teacher, adult educationACT Team and states that she is not getting the needed therapy, so we discussed ways for her to be proactive about letting the team know her need for weekly therapy.  We discussed how she can become involved in volunteer activity, and she stated she has applied for volunteer work [facility] [employer].  She was drowsy and depressed throughout group, very noticeably.  Type of Therapy:  Process Group with Motivational Interviewing  Participation Level:  Active  Participation Quality:  Attentive, Drowsy and Sharing  Affect:  Depressed and Flat  Cognitive:  Appropriate  Insight:  Developing/Improving  Engagement in Therapy:  Engaged  Modes of Intervention:   Education, Support and Processing, Activity  Ambrose MantleMareida Grossman-Orr, LCSW 08/04/2015

## 2015-08-04 NOTE — Progress Notes (Signed)
[facility]  08/04/2015 8:32 PM Mary Harper  MRN:  [id number] Subjective:  Mary Harper states that she is still having the suicidal ideas but they are not coming as strongly. States that the Ativan is helping greatly. She is still feels depressed with a sense of hopelessness helplessness not sure how things will get better for her Principal Problem: Bipolar affective disorder, depressed, severe (HCC) Diagnosis:   Patient Active Problem List   Diagnosis Date Noted  . Suicidal ideations [R45.851] 10/03/2014    Priority: High  . Bipolar affective disorder, depressed, severe (HCC) [F31.4] 07/13/2014    Priority: High  . PTSD (post-traumatic stress disorder) [F43.10] 03/12/2015  . Alcohol use disorder, severe, dependence (HCC) [F10.20] 12/02/2014  . Bipolar I disorder, most recent episode depressed (HCC) [F31.30] 12/02/2014  . Alcohol dependence, binge pattern (HCC) [F10.20] 07/14/2014  . Alcohol intoxication (HCC) [F10.129]    Total Time spent with patient: 20 minutes  Past Psychiatric History: see admission H and P  Past Medical History:  Past Medical History  Diagnosis Date  . Depression   . Bipolar 1 disorder (HCC)   . Alcoholism (HCC)   . PTSD (post-traumatic stress disorder)   . Carpal tunnel syndrome on both sides   . Gastric bypass status for obesity   . Anxiety     Past Surgical History  Procedure Laterality Date  . Gastric bypass     Family History:  Family History  Problem Relation Age of Onset  . Alcoholism Other     many relatives on mother side with alcohol abuse issues   Family Psychiatric  History: see Admission H and P Social History:  History  Alcohol Use  . Yes    Comment: Binge Drinking      History  Drug Use No    Comment: binge drink about every 3 weeks     Social History   Social History  . Marital Status: Single    Spouse Name: N/A  . Number of Children: N/A  . Years of Education: N/A   Social History Main Topics  . Smoking status:  Never Smoker   . Smokeless tobacco: Never Used  . Alcohol Use: Yes     Comment: Binge Drinking   . Drug Use: No     Comment: binge drink about every 3 weeks   . Sexual Activity: Not Currently   Other Topics Concern  . None   Social History Narrative   ** Merged History Encounter **       Additional Social History:                         Sleep: Nightmares  Appetite:  Poor  Current Medications: Current Facility-Administered Medications  Medication Dose Route Frequency Provider Last Rate Last Dose  . acetaminophen (TYLENOL) tablet 650 mg  650 mg Oral Q6H PRN Worthy FlankIjeoma E Nwaeze, NP      . alum & mag hydroxide-simeth (MAALOX/MYLANTA) 200-200-20 MG/5ML suspension 30 mL  30 mL Oral Q4H PRN Worthy FlankIjeoma E Nwaeze, NP      . buPROPion [facility](WELLBUTRIN) tablet 75 mg  75 mg Oral BID Worthy FlankIjeoma E Nwaeze, NP   75 mg at 08/04/15 1712  . divalproex (DEPAKOTE ER) 24 hr tablet 750 mg  750 mg Oral BID Worthy FlankIjeoma E Nwaeze, NP   750 mg at 08/04/15 0802  . gabapentin (NEURONTIN) capsule 100 mg  100 mg Oral TID Worthy FlankIjeoma E Nwaeze, NP   100 mg at 08/04/15  1712  . hydrOXYzine (ATARAX/VISTARIL) tablet 50 mg  50 mg Oral Q6H PRN Worthy Flank, NP   50 mg at 08/04/15 1712  . ibuprofen (ADVIL,MOTRIN) tablet 600 mg  600 mg Oral Q6H PRN Sanjuana Kava, NP   600 mg at 08/04/15 1357  . LORazepam (ATIVAN) tablet 1 mg  1 mg Oral Q4H PRN Rachael Fee, MD   1 mg at 08/04/15 0805  . magnesium hydroxide (MILK OF MAGNESIA) suspension 30 mL  30 mL Oral Daily PRN Worthy Flank, NP      . OLANZapine (ZYPREXA) tablet 10 mg  10 mg Oral QHS Worthy Flank, NP   10 mg at 08/03/15 2113  . traZODone (DESYREL) tablet 150 mg  150 mg Oral QHS Worthy Flank, NP   150 mg at 08/03/15 2113    Lab Results: No results found for this or any previous visit (from the past 48 hour(s)).  Physical Findings: AIMS: Facial and Oral Movements Muscles of Facial Expression: None, normal Lips and Perioral Area: None, normal Jaw: None, normal Tongue:  None, normal,Extremity Movements Upper (arms, wrists, hands, fingers): None, normal Lower (legs, knees, ankles, toes): None, normal, Trunk Movements Neck, shoulders, hips: None, normal, Overall Severity Severity of abnormal movements (highest score from questions above): None, normal Incapacitation due to abnormal movements: None, normal Patient's awareness of abnormal movements (rate only patient's report): No Awareness, Dental Status Current problems with teeth and/or dentures?: No Does patient usually wear dentures?: No  CIWA:  CIWA-Ar Total: 1 COWS:     Musculoskeletal: Strength & Muscle Tone: within normal limits Gait & Station: normal Patient leans: normal  Psychiatric Specialty Exam: Review of Systems  Constitutional: Positive for malaise/fatigue.  Eyes: Negative.   Respiratory: Negative.   Cardiovascular: Negative.   Gastrointestinal: Negative.   Genitourinary: Negative.   Musculoskeletal: Negative.   Skin: Negative.   Neurological: Positive for weakness and headaches.  Endo/Heme/Allergies: Negative.   Psychiatric/Behavioral: Positive for depression, suicidal ideas and substance abuse. The patient is nervous/anxious.     Blood pressure 94/68, pulse 107, temperature 98.6 F (37 C), temperature source Oral, resp. rate 17, height 5\' 5"  (1.651 m), weight 84.823 kg (187 lb), last menstrual period 07/18/2015, SpO2 100 %.Body mass index is 31.12 kg/(m^2).  General Appearance: Fairly Groomed  Patent attorney::  Fair  Speech:  Clear and Coherent  Volume:  Normal  Mood:  Anxious and Depressed  Affect:  Depressed  Thought Process:  Coherent and Goal Directed  Orientation:  Full (Time, Place, and Person)  Thought Content:  symptoms events worries concerns  Suicidal Thoughts:  Yes.  without intent/plan  Homicidal Thoughts:  No  Memory:  Immediate;   Fair Recent;   Fair Remote;   Fair  Judgement:  Fair  Insight:  Present and Shallow  Psychomotor Activity:  Restlessness   Concentration:  Fair  Recall:  [name] of Knowledge:Fair  Language: Fair  Akathisia:  No  Handed:  Right  AIMS (if indicated):     Assets:  Desire for Improvement Housing  ADL's:  Intact  Cognition: WNL  Sleep:  Number of Hours: 6.25   Treatment Plan Summary: Daily contact with patient to assess and evaluate symptoms and progress in treatment and Medication management  Supportive approach/coping skills Alcohol abuse; continue to work the relapse prevention plan Depression; will increase the Wellbutrin to 100 mg BID (SR) will avoid the XL due to possible absorption issues given her gastric by pass Will continue the  Ativan 1 mg PRN to decrease the overwhelming anxiety Mood instability; will continue the Depakote Will work with CBT.mindfulness Jazzma Neidhardt A 08/04/2015, 8:32 PM

## 2015-08-05 NOTE — Progress Notes (Signed)
[facility]  08/05/2015 8:09 PM Mary Harper  MRN:  [id number] Subjective:  States she is starting to feel a little better. Still endorses the suicidal ruminations but they are not coming as strongly. States the Ativan is helping with the anxiety. She is sleeping better now. She is still worried about when she goes back home. States that the loneliness gets to her.  Principal Problem: Bipolar affective disorder, depressed, severe (HCC) Diagnosis:   Patient Active Problem List   Diagnosis Date Noted  . Suicidal ideations [R45.851] 10/03/2014    Priority: High  . Bipolar affective disorder, depressed, severe (HCC) [F31.4] 07/13/2014    Priority: High  . PTSD (post-traumatic stress disorder) [F43.10] 03/12/2015  . Alcohol use disorder, severe, dependence (HCC) [F10.20] 12/02/2014  . Bipolar I disorder, most recent episode depressed (HCC) [F31.30] 12/02/2014  . Alcohol dependence, binge pattern (HCC) [F10.20] 07/14/2014  . Alcohol intoxication (HCC) [F10.129]    Total Time spent with patient: 20 minutes  Past Psychiatric History: see admission H and P  Past Medical History:  Past Medical History  Diagnosis Date  . Depression   . Bipolar 1 disorder (HCC)   . Alcoholism (HCC)   . PTSD (post-traumatic stress disorder)   . Carpal tunnel syndrome on both sides   . Gastric bypass status for obesity   . Anxiety     Past Surgical History  Procedure Laterality Date  . Gastric bypass     Family History:  Family History  Problem Relation Age of Onset  . Alcoholism Other     many relatives on mother side with alcohol abuse issues   Family Psychiatric  History: see admission H and P Social History:  History  Alcohol Use  . Yes    Comment: Binge Drinking      History  Drug Use No    Comment: binge drink about every 3 weeks     Social History   Social History  . Marital Status: Single    Spouse Name: N/A  . Number of Children: N/A  . Years of Education: N/A   Social  History Main Topics  . Smoking status: Never Smoker   . Smokeless tobacco: Never Used  . Alcohol Use: Yes     Comment: Binge Drinking   . Drug Use: No     Comment: binge drink about every 3 weeks   . Sexual Activity: Not Currently   Other Topics Concern  . None   Social History Narrative   ** Merged History Encounter **       Additional Social History:                         Sleep: Fair  Appetite:  Fair  Current Medications: Current Facility-Administered Medications  Medication Dose Route Frequency Provider Last Rate Last Dose  . acetaminophen (TYLENOL) tablet 650 mg  650 mg Oral Q6H PRN Worthy FlankIjeoma E Nwaeze, NP      . alum & mag hydroxide-simeth (MAALOX/MYLANTA) 200-200-20 MG/5ML suspension 30 mL  30 mL Oral Q4H PRN Worthy FlankIjeoma E Nwaeze, NP      . buPROPion [facility](WELLBUTRIN) tablet 100 mg  100 mg Oral BID Rachael FeeIrving A Elston Aldape, MD   100 mg at 08/05/15 1707  . divalproex (DEPAKOTE ER) 24 hr tablet 750 mg  750 mg Oral BID Worthy FlankIjeoma E Nwaeze, NP   750 mg at 08/05/15 1059  . gabapentin (NEURONTIN) capsule 100 mg  100 mg Oral TID Ijeoma E  Doran Stabler, NP   100 mg at 08/05/15 1707  . hydrOXYzine (ATARAX/VISTARIL) tablet 50 mg  50 mg Oral Q6H PRN Worthy Flank, NP   50 mg at 08/04/15 1712  . ibuprofen (ADVIL,MOTRIN) tablet 600 mg  600 mg Oral Q6H PRN Sanjuana Kava, NP   600 mg at 08/04/15 1357  . LORazepam (ATIVAN) tablet 1 mg  1 mg Oral Q4H PRN Rachael Fee, MD   1 mg at 08/05/15 1059  . magnesium hydroxide (MILK OF MAGNESIA) suspension 30 mL  30 mL Oral Daily PRN Worthy Flank, NP      . OLANZapine (ZYPREXA) tablet 10 mg  10 mg Oral QHS Worthy Flank, NP   10 mg at 08/04/15 2109  . traZODone (DESYREL) tablet 150 mg  150 mg Oral QHS Worthy Flank, NP   150 mg at 08/04/15 2109    Lab Results: No results found for this or any previous visit (from the past 48 hour(s)).  Physical Findings: AIMS: Facial and Oral Movements Muscles of Facial Expression: None, normal Lips and Perioral Area: None,  normal Jaw: None, normal Tongue: None, normal,Extremity Movements Upper (arms, wrists, hands, fingers): None, normal Lower (legs, knees, ankles, toes): None, normal, Trunk Movements Neck, shoulders, hips: None, normal, Overall Severity Severity of abnormal movements (highest score from questions above): None, normal Incapacitation due to abnormal movements: None, normal Patient's awareness of abnormal movements (rate only patient's report): No Awareness, Dental Status Current problems with teeth and/or dentures?: No Does patient usually wear dentures?: No  CIWA:  CIWA-Ar Total: 1 COWS:     Musculoskeletal: Strength & Muscle Tone: within normal limits Gait & Station: normal Patient leans: normal  Psychiatric Specialty Exam: Review of Systems  Constitutional: Negative.   HENT: Negative.   Eyes: Negative.   Respiratory: Negative.   Cardiovascular: Negative.   Gastrointestinal: Negative.   Genitourinary: Negative.   Musculoskeletal: Negative.   Skin: Negative.   Neurological: Negative.   Endo/Heme/Allergies: Negative.   Psychiatric/Behavioral: Positive for depression, suicidal ideas and substance abuse. The patient is nervous/anxious.     Blood pressure 90/73, pulse 97, temperature 97.7 F (36.5 C), temperature source Oral, resp. rate 16, height 5\' 5"  (1.651 m), weight 84.823 kg (187 lb), last menstrual period 07/18/2015, SpO2 100 %.Body mass index is 31.12 kg/(m^2).  General Appearance: Fairly Groomed  Patent attorney::  Fair  Speech:  Clear and Coherent  Volume:  Decreased  Mood:  Anxious and Depressed  Affect:  Restricted  Thought Process:  Coherent and Goal Directed  Orientation:  Full (Time, Place, and Person)  Thought Content:  symptoms events worries concerns  Suicidal Thoughts:  Yes.  without intent/plan  Homicidal Thoughts:  No  Memory:  Immediate;   Fair Recent;   Fair Remote;   Fair  Judgement:  Fair  Insight:  Present and Shallow  Psychomotor Activity:   Decreased  Concentration:  Fair  Recall:  [name] of Knowledge:Fair  Language: Fair  Akathisia:  No  Handed:  Right  AIMS (if indicated):     Assets:  Desire for Improvement Housing  ADL's:  Intact  Cognition: WNL  Sleep:  Number of Hours: 6.25   Treatment Plan Summary: Daily contact with patient to assess and evaluate symptoms and progress in treatment and Medication management Supportive approach/coping skills Alcohol dependence; continue to work a relapse prevention plan Depression; continue to work with Wellbutrin SR 100 mg BID (Has tolerated the increase well) Anxiety; will continue to work  with the Ativan, she is getting a lot of benefit from the Ativan. Will have to decide if she would benefit from longer term benzodiazepine use Mood instability; will continue the Depakote Work with CBT/mindfulness Loraine Freid A 08/05/2015, 8:09 PM

## 2015-08-05 NOTE — Progress Notes (Signed)
Patient ID: Charmayne Sheernn Enslin, female   DOB: 03-18-1966, 50 y.o.   MRN: [id number] D-Completed self inventory and rated self a 7 on how she is feeling in regards to her depression and feelings of hopelessness and an 8 on how she is feeling regarding her anxiety.. She reports almost constant racing thoughts and endorses thoughts of fusicide but is able to contract for safety.  Reports shoulder and neck pain of a 5 and her goal for today is to stop suicidal thoughts. States historically she feels more depressed in Dec because she has had a lot of loss in Dec and the holidays are difficult for her. A-Support offered and medications as ordered and continues to be monitored for safety. Gave her word searches and some magazines to help her stay busy. R-States she is in need of distraction while here because she continues to have thoughts to hurt self. She is able to contract for safety.

## 2015-08-05 NOTE — Progress Notes (Signed)
Recreation Therapy Notes  Date: 01.02.2017 Time: 9:30am Location: 300 Group Room   Group Topic: Stress Management  Goal Area(s) Addresses:  Patient will actively participate in stress management techniques presented during session.   Behavioral Response: Did not attend.   Marykay Lexenise L Nicholaus Steinke, LRT/CTRS        Nadiah Corbit L 08/05/2015 10:16 AM

## 2015-08-05 NOTE — [facility] Group Notes (Signed)
[facility] LCSW Group Therapy  08/05/2015 4:01 PM  Type of Therapy:  Group Therapy  Participation Level:  Active  Participation Quality:  Attentive  Affect:  Depressed and Flat  Cognitive:  Oriented  Insight:  Limited  Engagement in Therapy:  Improving  Modes of Intervention:  Confrontation, Discussion, Education, Problem-solving, Rapport Building, Socialization and Support  Summary of Progress/Problems: Today's Topic: Overcoming Obstacles. Patients identified one short term goal and potential obstacles in reaching this goal. Patients processed barriers involved in overcoming these obstacles. Patients identified steps necessary for overcoming these obstacles and explored motivation (internal and external) for facing these difficulties head on. [name] was attentive and engaged during today's processing group. She stated that her biggest obstacle involves "getting motivated to get out of my empty apt and do things." [name] reports that being lonely, relapsing on alcohol, and not getting enough community/family support were triggers for her latest mental health relapse. "I have the numbers of some ladies that I can call to take me to groups and I need to get out of the apt more." She continues to show progress in the group setting with improving insight.   Smart, Kenwood Rosiak LCSW 08/05/2015, 4:01 PM

## 2015-08-05 NOTE — Progress Notes (Signed)
Patient ID: Mary Harper, female   DOB: May 29, 1966, 50 y.o.   MRN: [id number]   D: Pt has been very flat and depressed on the unit. Pt has also been very anxious and has required medication every six hours for anxiety. Pt slept most of the morning, she reported that she did not sleep well last night due to nightmares. Pt reported that she was positive SI, but could contract for safety. Pt reported being negative HI, no AH/VH noted. Pt reported that her goal for today was to stop the anxiety and SI. A: 15 min checks continued for patient safety. R: Pt safety maintained.

## 2015-08-05 NOTE — Progress Notes (Signed)
  D: Patient pleasant and cooperative with care, but does endorse depression with a dull flat affect. Pt states she has passive SI but no plans to act on them or to harm herself. Pt out in the milieu and attended group session. A: Q 15 minute safety checks, encourage staff/peer interaction, group participation, administer medications as ordered. R: Pt compliant with HS medications; no s/s of distress noted this shift.

## 2015-08-06 MED ORDER — TOPIRAMATE 25 MG PO TABS
25.0000 mg | ORAL_TABLET | Freq: Every day | ORAL | Status: DC
  Administered 2015-08-06 – 2015-08-08 (×3): 25 mg via ORAL
  Filled 2015-08-06 (×5): qty 1

## 2015-08-06 NOTE — Progress Notes (Signed)
D: Mary Harper is somber this evening. Minimally interactive with a flat affect. She rates Anxiety 8/10 Depression 8/10 Hopelessness 8/10. Denies AVH. Endorses passive SI however contracts for safety at this time. She states her goal today was to " lessen my anxiety by distracting myself and not being in the dayroom all day". She attended the evening meeting on the unit.  A: Encouragement and support given. Medications administered as prescribed. Q15 minute checks for patient safety.  R: Continue to monitor for patient safety and medication effectiveness.
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
## 2015-08-06 NOTE — [facility] Group Notes (Signed)
[facility] Group Notes:  (Nursing/MHT/Case Management/Adjunct)  Date:  08/06/2015  Time:  1:42 PM   Type of Therapy:  Psychoeducational Skills  Participation Level:  Did Not Attend  Participation Quality:  DID NOT ATTEND  Affect:  DID NOT ATTEND  Cognitive:  DID NOT ATTEND  Insight:  None  Engagement in Group:  DID NOT ATTEND  Modes of Intervention:  DID NOT ATTEND  Summary of Progress/Problems: Pt did not attend patient self inventory group.   Bethann PunchesJane O Evalina Tabak 08/06/2015, 1:42 PM

## 2015-08-06 NOTE — Progress Notes (Signed)
Patient ID: Mary Harper, female   DOB: Feb 14, 1966, 50 y.o.   MRN: [id number] PER STATE REGULATIONS 482.30  THIS CHART WAS REVIEWED FOR MEDICAL NECESSITY WITH RESPECT TO THE PATIENT'S ADMISSION/ DURATION OF STAY.  NEXT REVIEW DATE: 08/09/2015  Willa RoughJENNIFER JONES Sedonia Kitner, RN, BSN CASE MANAGER

## 2015-08-06 NOTE — [facility] Group Notes (Signed)
[facility] LCSW Group Therapy  08/06/2015 1:41 PM  Type of Therapy:  Group Therapy  Participation Level:  Active  Participation Quality:  Attentive  Affect:  Depressed  Cognitive:  Alert and Oriented  Insight:  Improving  Engagement in Therapy:  Improving  Modes of Intervention:  Discussion, Education, Exploration, Problem-solving, Rapport Building, Socialization and Support  Summary of Progress/Problems: MHA Speaker came to talk about his personal journey with substance abuse and addiction. The pt processed ways by which to relate to the speaker. MHA speaker provided handouts and educational information pertaining to groups and services offered by [facility].   Smart, Graceson Nichelson LCSW 08/06/2015, 1:41 PM

## 2015-08-06 NOTE — Progress Notes (Signed)
D: Isa was asleep initially upon the start of shift. She awoke shortly thereafter. Rates Anxiety 7/10, Depression 6/10 Headache 4-5/10. Denies SI/HI/AVH. Goal today was to "Try to lessen my anxiety by staying in the dayroom but that didn't work".  A: Q 15 checks for patient safety. Encouragement and support given.  R: Continue to monitor for patient safety and medication effectiveness.

## 2015-08-06 NOTE — Progress Notes (Signed)
DAR: Pt present with depressed mood and flat affect on the unit. Pt has been present in the milieu but has not been interacting much. As per the inventory, pt had a fair sleep, poor appetite, low energy, and poor concentration. Pt rates her depresion at 7, hopelessness at a 6, and anxiety at a 8. Pt did not attend groups. Pt's safety ensured with 15 minute and environmental checks. Pt currently denies HI and A/V hallucinations but having passive SI. Pt verbally agrees to seek staff if SI/HI or A/VH occurs and to consult with staff before acting on these thoughts. Will continue POC.

## 2015-08-06 NOTE — Progress Notes (Signed)
Recreation Therapy Notes  Animal-Assisted Activity (AAA) Program Checklist/Progress Notes Patient Eligibility Criteria Checklist & Daily Group note for Rec Tx Intervention  Date: 01.03.2017 Time: 2:45pm [address]    AAA/T Program Assumption of Risk Form signed by Patient/ or Parent Legal Guardian yes  Patient is free of allergies or sever asthma yes  Patient reports no fear of animals yes  Patient reports no history of cruelty to animals yes  Patient understands his/her participation is voluntary yes  Behavioral Response: Did not attend.  Marykay Lexenise L Derionna Salvador, LRT/CTRS  Jearl KlinefelterBlanchfield, Rasheda Ledger L 08/06/2015 1:59 PM

## 2015-08-07 DIAGNOSIS — F1020 Alcohol dependence, uncomplicated: Secondary | ICD-10-CM

## 2015-08-07 NOTE — Progress Notes (Signed)
Recreation Therapy Notes  Date: 01.04.2017 Time: 9:30am Location: [address] Group Room   Group Topic: Stress Management  Goal Area(s) Addresses:  Patient will actively participate in stress management techniques presented during session.   Behavioral Response: Did not attend.   Marykay Lexenise L Dmarius Reeder, LRT/CTRS  Jearl KlinefelterBlanchfield, Kayhan Boardley L 08/07/2015 12:42 PM

## 2015-08-07 NOTE — Progress Notes (Signed)
Patient ID: Mary Harper, female   DOB: 02/12/1966, 50 y.o.   MRN: [id number] D: Patient calm and cooperative. Pt in room resting c/o headache. Pt rated pain as 6 on a 0-10 scale. mood and affect appeared depressed and flat. Pt reports she is tolerating medication well. Pt denies SI/HI/AVH.  A: Met with pt 1:1. Medications administered as prescribed. Support and encouragement provided to attend groups and engage in milieu. Pt encouraged to discuss feelings and come to staff with any question or concerns.  R: Patient remains safe and complaint with medications.

## 2015-08-07 NOTE — Progress Notes (Signed)
[facility]  08/07/2015 5:50 PM Mary Harper  MRN:  [id number] Subjective:  Mary Harper states that she is having Harper HA and that she is avoiding the lights the noise. Did not have nightmares last night. States the Ativan has help enormously to ease the anxiety. She is still having some suicidal ruminations but they are not as strong as they were before. States that having the anxiety under better control is helping to make any SI more manageable Principal Problem: Bipolar affective disorder, depressed, severe (HCC) Diagnosis:   Patient Active Problem List   Diagnosis Date Noted  . Suicidal ideations [R45.851] 10/03/2014    Priority: High  . Bipolar affective disorder, depressed, severe (HCC) [F31.4] 07/13/2014    Priority: High  . PTSD (post-traumatic stress disorder) [F43.10] 03/12/2015  . Alcohol use disorder, severe, dependence (HCC) [F10.20] 12/02/2014  . Bipolar I disorder, most recent episode depressed (HCC) [F31.30] 12/02/2014  . Alcohol dependence, binge pattern (HCC) [F10.20] 07/14/2014  . Alcohol intoxication (HCC) [F10.129]    Total Time spent with patient: 20 minutes  Past Psychiatric History: See admission H and P  Past Medical History:  Past Medical History  Diagnosis Date  . Depression   . Bipolar 1 disorder (HCC)   . Alcoholism (HCC)   . PTSD (post-traumatic stress disorder)   . Carpal tunnel syndrome on both sides   . Gastric bypass status for obesity   . Anxiety     Past Surgical History  Procedure Laterality Date  . Gastric bypass     Family History:  Family History  Problem Relation Age of Onset  . Alcoholism Other     many relatives on mother side with alcohol abuse issues   Family Psychiatric  History: see admission H and P Social History:  History  Alcohol Use  . Yes    Comment: Binge Drinking      History  Drug Use No    Comment: binge drink about every 3 weeks     Social History   Social History  . Marital Status: Single    Spouse Name:  N/Harper  . Number of Children: N/Harper  . Years of Education: N/Harper   Social History Main Topics  . Smoking status: Never Smoker   . Smokeless tobacco: Never Used  . Alcohol Use: Yes     Comment: Binge Drinking   . Drug Use: No     Comment: binge drink about every 3 weeks   . Sexual Activity: Not Currently   Other Topics Concern  . None   Social History Narrative   ** Merged History Encounter **       Additional Social History:                         Sleep: Fair  Appetite:  Fair  Current Medications: Current Facility-Administered Medications  Medication Dose Route Frequency Provider Last Rate Last Dose  . acetaminophen (TYLENOL) tablet 650 mg  650 mg Oral Q6H PRN Worthy FlankIjeoma E Nwaeze, NP      . alum & mag hydroxide-simeth (MAALOX/MYLANTA) 200-200-20 MG/5ML suspension 30 mL  30 mL Oral Q4H PRN Worthy FlankIjeoma E Nwaeze, NP      . buPROPion [facility](WELLBUTRIN) tablet 100 mg  100 mg Oral BID Rachael FeeIrving Harper Cheryle Dark, MD   100 mg at 08/07/15 1728  . divalproex (DEPAKOTE ER) 24 hr tablet 750 mg  750 mg Oral BID Worthy FlankIjeoma E Nwaeze, NP   750 mg at 08/07/15 0801  .  gabapentin (NEURONTIN) capsule 100 mg  100 mg Oral TID Worthy Flank, NP   100 mg at 08/07/15 1727  . hydrOXYzine (ATARAX/VISTARIL) tablet 50 mg  50 mg Oral Q6H PRN Worthy Flank, NP   50 mg at 08/04/15 1712  . ibuprofen (ADVIL,MOTRIN) tablet 600 mg  600 mg Oral Q6H PRN Sanjuana Kava, NP   600 mg at 08/07/15 1322  . LORazepam (ATIVAN) tablet 1 mg  1 mg Oral Q4H PRN Rachael Fee, MD   1 mg at 08/07/15 1322  . magnesium hydroxide (MILK OF MAGNESIA) suspension 30 mL  30 mL Oral Daily PRN Worthy Flank, NP      . OLANZapine (ZYPREXA) tablet 10 mg  10 mg Oral QHS Worthy Flank, NP   10 mg at 08/06/15 2109  . topiramate (TOPAMAX) tablet 25 mg  25 mg Oral QHS Rachael Fee, MD   25 mg at 08/06/15 2109  . traZODone (DESYREL) tablet 150 mg  150 mg Oral QHS Worthy Flank, NP   150 mg at 08/06/15 2108    Lab Results: No results found for this or any  previous visit (from the past 48 hour(s)).  Physical Findings: AIMS: Facial and Oral Movements Muscles of Facial Expression: None, normal Lips and Perioral Area: None, normal Jaw: None, normal Tongue: None, normal,Extremity Movements Upper (arms, wrists, hands, fingers): None, normal Lower (legs, knees, ankles, toes): None, normal, Trunk Movements Neck, shoulders, hips: None, normal, Overall Severity Severity of abnormal movements (highest score from questions above): None, normal Incapacitation due to abnormal movements: None, normal Patient's awareness of abnormal movements (rate only patient's report): No Awareness, Dental Status Current problems with teeth and/or dentures?: No Does patient usually wear dentures?: No  CIWA:  CIWA-Ar Total: 1 COWS:     Musculoskeletal: Strength & Muscle Tone: within normal limits Gait & Station: normal Patient leans: normal  Psychiatric Specialty Exam: Review of Systems  Constitutional: Negative.   Eyes: Negative.   Respiratory: Negative.   Cardiovascular: Negative.   Gastrointestinal: Negative.   Genitourinary: Negative.   Musculoskeletal: Negative.   Skin: Negative.   Neurological: Positive for headaches.  Endo/Heme/Allergies: Negative.   Psychiatric/Behavioral: Positive for depression and substance abuse. The patient is nervous/anxious.     Blood pressure 90/73, pulse 111, temperature 97.8 F (36.6 C), temperature source Oral, resp. rate 16, height 5\' 5"  (1.651 m), weight 84.823 kg (187 lb), last menstrual period 07/18/2015, SpO2 100 %.Body mass index is 31.12 kg/(m^2).  General Appearance: Fairly Groomed  Patent attorney::  Fair  Speech:  Clear and Coherent  Volume:  Normal  Mood:  Anxious and sad  Affect:  Restricted  Thought Process:  Coherent and Goal Directed  Orientation:  Full (Time, Place, and Person)  Thought Content:  symptoms events worries concerns  Suicidal Thoughts:  Yes.  without intent/plan, less often less intense   Homicidal Thoughts:  No  Memory:  Immediate;   Fair Recent;   Fair Remote;   Fair  Judgement:  Fair  Insight:  Present  Psychomotor Activity:  Decreased  Concentration:  Fair  Recall:  [name] of Knowledge:Fair  Language: Fair  Akathisia:  No  Handed:  Right  AIMS (if indicated):     Assets:  Desire for Improvement  ADL's:  Intact  Cognition: WNL  Sleep:  Number of Hours: 7   Treatment Plan Summary: Daily contact with patient to assess and evaluate symptoms and progress in treatment and Medication management Supportive  approach/coping skills Alcohol dependence; continue to work Harper relapse prevention plan Depression; continue the Wellbutrin  Anxiety; continue the Ativan 1 mg TID  Nightmares continue the Topamax at 25 mg and consider increasing to 50 mg if needed Work with CBT/mindfulness Mary Harper 08/07/2015, 5:50 PM

## 2015-08-07 NOTE — [facility] Group Notes (Signed)
[facility] LCSW Group Therapy  08/07/2015 3:50 PM  Type of Therapy:  Group Therapy  Participation Level:  Did Not Attend-pt chose to remain in bed.   Summary of Progress/Problems: Emotion Regulation: This group focused on both positive and negative emotion identification and allowed group members to process ways to identify feelings, regulate negative emotions, and find healthy ways to manage internal/external emotions. Group members were asked to reflect on a time when their reaction to an emotion led to a negative outcome and explored how alternative responses using emotion regulation would have benefited them. Group members were also asked to discuss a time when emotion regulation was utilized when a negative emotion was experienced.   Smart, Ebrahim Deremer LCSW 08/07/2015, 3:50 PM

## 2015-08-07 NOTE — Plan of Care (Signed)
Problem: Diagnosis: Increased Risk For Suicide Attempt Goal: LTG-Patient Will Report Improved Mood and Deny Suicidal LTG (by discharge) Patient will report improved mood and deny suicidal ideation.  Outcome: Progressing Pt stated she still having a passive SI which comes and go, but feeling better today.

## 2015-08-07 NOTE — [facility] Group Notes (Signed)
[facility] LCSW Aftercare Discharge Planning Group Note   08/07/2015 11:10 AM  Participation Quality:  Invited. DID NOT ATTEND. Pt chose to remain in bed.   Smart, Kyheem Bathgate LCSW   

## 2015-08-07 NOTE — Plan of Care (Signed)
Problem: Alteration in mood & ability to function due to Goal: STG-Patient will comply with prescribed medication regimen (Patient will comply with prescribed medication regimen)  Outcome: Progressing Pt compliant with medication regime     

## 2015-08-07 NOTE — Progress Notes (Signed)
D. Pt present with depressed mood and flat affect on the unit. Pt also isolate herself due to be over whelmed with everytthing that going on in the unit. As per self inventory, pt had a good sleep, fair appetite, low energy and poor concentration. Pt rate her depression at a 5, hopelessness at a 5, and anxiety at a 6. Pt state her goal for today is "develop a plan to go home." and intend to do this by " speak with social worker and Dr." Pt's safety ensured with 15 minute and environmental checks. Pt currently denies SI/HI and A/V hallucinations. Pt verbally agrees to seek staff if SI/HI or A/VH occurs and to consult with staff before acting on these thoughts. Will continue POC.

## 2015-08-08 NOTE — Plan of Care (Signed)
Problem: Diagnosis: Increased Risk For Suicide Attempt Goal: STG-Patient Will Attend All Groups On The Unit Outcome: Progressing Pt attended evening wrap up group     

## 2015-08-08 NOTE — Progress Notes (Signed)
[facility] Group Notes:  (Nursing/MHT/Case Management/Adjunct)  Date:  08/08/2015  Time: 2030 Type of Therapy:  wrap up group  Participation Level:  Active  Participation Quality:  Appropriate, Attentive, Sharing and Supportive  Affect:  Tearful  Cognitive:  Appropriate  Insight:  Good  Engagement in Group:  Engaged  Modes of Intervention:  Clarification, Education and Support  Summary of Progress/Problems: Pt reported being happy about going home tomorrow but when she spoke of her anxiety became very tearful. Pt lives alone but is happy with her accommodations and plans to connect with a group of AA ladies and shares that she has other supports. Pt also shared that she has a go to list that she keeps of activities to do to help her with her anxiety.    Shelah LewandowskySquires, Cadince Hilscher Carol 08/08/2015, 10:10 PM

## 2015-08-08 NOTE — Progress Notes (Signed)
Patient ID: Mary Harper, female   DOB: 04/16/1966, 49 y.o.   MRN: [id number] D: Patient calm and cooperative sitting in dayroom. Pt affect a lot brighter than previous night but still flat. Mood appeared depressed. Pt reports she has been able to go to groups and tolerating medication well. Pt attended evening    wrap up group. Pt denies HI/AVH.  A: Met with pt 1:1. Medications administered as prescribed. Support and encouragement provided to continue attending groups and engaging in milieu. Pt encouraged to discuss feelings and come to staff with any question or           concerns.  R: Patient remains safe and complaint with medications. Pt contracts for safety.  

## 2015-08-08 NOTE — [facility] Group Notes (Signed)
[facility] Group Notes:  (Nursing/MHT/Case Management/Adjunct)  Date:  08/08/2015  Time:  10:10 AM  Type of Therapy:  Nurse Education  Participation Level:  Active  Participation Quality:  Appropriate and Attentive  Affect:  Appropriate  Cognitive:  Alert and Appropriate  Insight:  Appropriate  Engagement in Group:  Engaged  Modes of Intervention:  Clarification and Discussion  Summary of Progress/Problems: Was able to give a goal and talked about meeting up with social worker to make final discharge plans  Andres Egeritchett, Jolyne Laye Hundley 08/08/2015, 10:10 AM

## 2015-08-08 NOTE — [facility] Group Notes (Signed)
[facility] LCSW Group Therapy  08/08/2015 1:07 PM  Type of Therapy:  Group Therapy  Participation Level:  Active  Participation Quality:  Attentive  Affect:  Appropriate  Cognitive:  Alert  Insight:  Improving  Engagement in Therapy:  Improving  Modes of Intervention:  Confrontation, Discussion, Education, Exploration, Problem-solving, Rapport Building, Socialization and Support  Summary of Progress/Problems:  Finding Balance in Life. Today's group focused on defining balance in one's own words, identifying things that can knock one off balance, and exploring healthy ways to maintain balance in life. Group members were asked to provide an example of a time when they felt off balance, describe how they handled that situation,and process healthier ways to regain balance in the future. Group members were asked to share the most important tool for maintaining balance that they [facility] and how they plan to apply this method after discharge.  Mary Harper was attentive and engaged during today's processing group. She shared that s3e was starting to feel more balanced "emotionally and mentally." Mary Harper was happy to learn that [facility] offers groups daily and gives [place]. She reports that she feels ready to d/c in the morning and is feeling hopeful about her future.   Smart, Trace Wirick LCSW 08/08/2015, 1:07 PM

## 2015-08-08 NOTE — Progress Notes (Signed)
D-  Patient is depressed and emotional this shift.  Patient reports "fleeting" suicidal thoughts without a plan.  She contracts for safety. Patient currently denies HI and AVH.  Patient has complaints of anxiety.  Patient reports anti-anxiety medication effective in decreasing anxiety.  Patient had concerns on transportation home at discharge.  Patient's concerns were discussed with Child psychotherapistsocial worker. On patient's self-inventory, she rates her depression "5", feelings of hopelessness "5", and anxiety "6" with "10" being the worst. A- Scheduled medications and PRN medications administered to patient, per MD orders. Support and encouragement provided.  Routine safety checks conducted every 15 minutes.  Patient informed to notify staff with problems or concerns. R- No adverse drug reactions noted. Patient contracts for safety at this time. Patient compliant with medications and treatment plan. Patient receptive, calm, and cooperative. Patient remains safe at this time.

## 2015-08-08 NOTE — Progress Notes (Signed)
[facility] MD Progress Note  08/08/2015 5:09 PM Mary Harper  MRN:  [id number] Subjective:  Mary Harper is denying SI today. States the Ativan continues to help with the anxiety. Feels the anxiety triggers the SI. Having the anxiety under better control has help the SI. She states she is trying to identify resources she can use in [facility] to get herself busy, decrease the isolation.  Principal Problem: Bipolar affective disorder, depressed, severe (HCC) Diagnosis:   Patient Active Problem List   Diagnosis Date Noted  . Suicidal ideations [R45.851] 10/03/2014    Priority: High  . Bipolar affective disorder, depressed, severe (HCC) [F31.4] 07/13/2014    Priority: High  . PTSD (post-traumatic stress disorder) [F43.10] 03/12/2015  . Alcohol use disorder, severe, dependence (HCC) [F10.20] 12/02/2014  . Bipolar I disorder, most recent episode depressed (HCC) [F31.30] 12/02/2014  . Alcohol dependence, binge pattern (HCC) [F10.20] 07/14/2014  . Alcohol intoxication (HCC) [F10.129]    Total Time spent with patient: 20 minutes  Past Psychiatric History: see admission H and P  Past Medical History:  Past Medical History  Diagnosis Date  . Depression   . Bipolar 1 disorder (HCC)   . Alcoholism (HCC)   . PTSD (post-traumatic stress disorder)   . Carpal tunnel syndrome on both sides   . Gastric bypass status for obesity   . Anxiety     Past Surgical History  Procedure Laterality Date  . Gastric bypass     Family History:  Family History  Problem Relation Age of Onset  . Alcoholism Other     many relatives on mother side with alcohol abuse issues   Family Psychiatric  History: see admission H and P Social History:  History  Alcohol Use  . Yes    Comment: Binge Drinking      History  Drug Use No    Comment: binge drink about every 3 weeks     Social History   Social History  . Marital Status: Single    Spouse Name: N/A  . Number of Children: N/A  . Years of Education: N/A   Social  History Main Topics  . Smoking status: Never Smoker   . Smokeless tobacco: Never Used  . Alcohol Use: Yes     Comment: Binge Drinking   . Drug Use: No     Comment: binge drink about every 3 weeks   . Sexual Activity: Not Currently   Other Topics Concern  . None   Social History Narrative   ** Merged History Encounter **       Additional Social History:                         Sleep: Fair  Appetite:  Fair  Current Medications: Current Facility-Administered Medications  Medication Dose Route Frequency Provider Last Rate Last Dose  . acetaminophen (TYLENOL) tablet 650 mg  650 mg Oral Q6H PRN Worthy Flank, NP      . alum & mag hydroxide-simeth (MAALOX/MYLANTA) 200-200-20 MG/5ML suspension 30 mL  30 mL Oral Q4H PRN Worthy Flank, NP      . buPROPion [facility]) tablet 100 mg  100 mg Oral BID Rachael Fee, MD   100 mg at 08/08/15 0853  . divalproex (DEPAKOTE ER) 24 hr tablet 750 mg  750 mg Oral BID Worthy Flank, NP   750 mg at 08/08/15 0849  . gabapentin (NEURONTIN) capsule 100 mg  100 mg Oral TID Ijeoma E  Doran Stabler, NP   100 mg at 08/08/15 1308  . hydrOXYzine (ATARAX/VISTARIL) tablet 50 mg  50 mg Oral Q6H PRN Worthy Flank, NP   50 mg at 08/04/15 1712  . ibuprofen (ADVIL,MOTRIN) tablet 600 mg  600 mg Oral Q6H PRN Sanjuana Kava, NP   600 mg at 08/07/15 1322  . LORazepam (ATIVAN) tablet 1 mg  1 mg Oral Q4H PRN Rachael Fee, MD   1 mg at 08/08/15 0853  . magnesium hydroxide (MILK OF MAGNESIA) suspension 30 mL  30 mL Oral Daily PRN Worthy Flank, NP      . OLANZapine (ZYPREXA) tablet 10 mg  10 mg Oral QHS Worthy Flank, NP   10 mg at 08/07/15 2203  . topiramate (TOPAMAX) tablet 25 mg  25 mg Oral QHS Rachael Fee, MD   25 mg at 08/07/15 2203  . traZODone (DESYREL) tablet 150 mg  150 mg Oral QHS Worthy Flank, NP   150 mg at 08/07/15 2203    Lab Results: No results found for this or any previous visit (from the past 48 hour(s)).  Physical Findings: AIMS:  Facial and Oral Movements Muscles of Facial Expression: None, normal Lips and Perioral Area: None, normal Jaw: None, normal Tongue: None, normal,Extremity Movements Upper (arms, wrists, hands, fingers): None, normal Lower (legs, knees, ankles, toes): None, normal, Trunk Movements Neck, shoulders, hips: None, normal, Overall Severity Severity of abnormal movements (highest score from questions above): None, normal Incapacitation due to abnormal movements: None, normal Patient's awareness of abnormal movements (rate only patient's report): No Awareness, Dental Status Current problems with teeth and/or dentures?: No Does patient usually wear dentures?: No  CIWA:  CIWA-Ar Total: 1 COWS:     Musculoskeletal: Strength & Muscle Tone: within normal limits Gait & Station: normal Patient leans: normal  Psychiatric Specialty Exam: Review of Systems  Constitutional: Negative.   HENT: Negative.   Eyes: Negative.   Respiratory: Negative.   Cardiovascular: Negative.   Gastrointestinal: Negative.   Genitourinary: Negative.   Musculoskeletal: Negative.   Skin: Negative.   Neurological: Negative.   Endo/Heme/Allergies: Negative.   Psychiatric/Behavioral: Positive for depression and substance abuse. The patient is nervous/anxious.     Blood pressure 97/69, pulse 103, temperature 98.5 F (36.9 C), temperature source Oral, resp. rate 20, height 5\' 5"  (1.651 m), weight 84.823 kg (187 lb), last menstrual period 07/18/2015, SpO2 100 %.Body mass index is 31.12 kg/(m^2).  General Appearance: Fairly Groomed  Patent attorney::  Fair  Speech:  Clear and Coherent  Volume:  Decreased  Mood:  Anxious and but "a little better"  Affect:  Restricted  Thought Process:  Coherent and Goal Directed  Orientation:  Full (Time, Place, and Person)  Thought Content:  symptoms events worries concerns  Suicidal Thoughts:  No  Homicidal Thoughts:  No  Memory:  Immediate;   Fair Recent;   Fair Remote;   Fair   Judgement:  Fair  Insight:  Present and Shallow  Psychomotor Activity:  Decreased  Concentration:  Fair  Recall:  [name] of Knowledge:Fair  Language: Fair  Akathisia:  No  Handed:  Right  AIMS (if indicated):     Assets:  Desire for Improvement Housing Social Support  ADL's:  Intact  Cognition: WNL  Sleep:  Number of Hours: 6.25   Treatment Plan Summary: Daily contact with patient to assess and evaluate symptoms and progress in treatment and Medication management Supportive approach/coping skills Alcohol dependence; continue to work a  relapse prevention plan Depression; continue to work with the Wellbutrin SR 100 mg BID Nightmares; continue the Topamax 25 mg HS Mood instability; continue to work with the Depakote Will get a Depakote level in the AM Work with CBT/mindfulness Lalitha Ilyas A 08/08/2015, 5:09 PM

## 2015-08-08 NOTE — Plan of Care (Signed)
Problem: Diagnosis: Increased Risk For Suicide Attempt Goal: STG-Patient Will Comply With Medication Regime Outcome: Progressing Pt compliant with medication regime     

## 2015-08-08 NOTE — Progress Notes (Signed)
CSW contacted pt's ACT team ([name]) to arrange transportation for 1:30PM or later on Frdaiy 08/09/15.  Trula SladeHeather Smart, MSW, LCSW Clinical Social Worker 08/08/2015 10:45 AM

## 2015-08-09 LAB — VALPROIC ACID LEVEL: VALPROIC ACID LVL: 55 ug/mL (ref 50.0–100.0)

## 2015-08-09 MED ORDER — LORAZEPAM 1 MG PO TABS: ORAL_TABLET | ORAL | Status: DC

## 2015-08-09 MED ORDER — HYDROXYZINE HCL 50 MG PO TABS: 50.0000 mg | ORAL_TABLET | Freq: Four times a day (QID) | ORAL | Status: DC | PRN

## 2015-08-09 MED ORDER — TOPIRAMATE 25 MG PO TABS: 25.0000 mg | ORAL_TABLET | Freq: Every day | ORAL | Status: DC

## 2015-08-09 MED ORDER — OLANZAPINE 10 MG PO TABS: 10.0000 mg | ORAL_TABLET | Freq: Every day | ORAL | Status: DC

## 2015-08-09 MED ORDER — DIVALPROEX SODIUM ER 250 MG PO TB24: 750.0000 mg | ORAL_TABLET | Freq: Two times a day (BID) | ORAL | Status: DC

## 2015-08-09 MED ORDER — TRAZODONE HCL 150 MG PO TABS: 150.0000 mg | ORAL_TABLET | Freq: Every day | ORAL | Status: DC

## 2015-08-09 MED ORDER — GABAPENTIN 100 MG PO CAPS: 100.0000 mg | ORAL_CAPSULE | Freq: Three times a day (TID) | ORAL | Status: DC

## 2015-08-09 MED ORDER — BUPROPION HCL 100 MG PO TABS: 100.0000 mg | ORAL_TABLET | Freq: Two times a day (BID) | ORAL | Status: DC
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
## 2015-08-09 NOTE — Progress Notes (Addendum)
D:  Patient's self inventory sheet, patient sleeps good, sleep medication is helpful.  Fair appetite, low energy level, poor concentration.  Rated depression and hopeless 3.  Anxiety 5-6.  SI sometimes, contracts for safety.  Physical problems, dizzy headaches.  Worst pain #5 in past 24 hours, headache.  No pain medication.  "Getting home safely, continue to work on my recovery plan, go to an AA meeting.  Work with case Production designer, theatre/television/filmmanager, [place].  Does have discharge plans.  "Getting a ride home is out of my hands so that makes me nervous/anxious." A:  Medications administered per MD orders.  Emotional support and encouragement given patient. R:  Denied SI and HI while talking to nurse this morning, contracts for safety.  Denied A/V hallucinations.  Safety maintained with 15 minute checks.
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
## 2015-08-09 NOTE — Progress Notes (Signed)
  [facility] Adult Case Management Discharge Plan :  Will you be returning to the same living situation after discharge:  Yes,  home At discharge, do you have transportation home?: Yes,  [name] Do you have the ability to pay for your medications: Yes,  Medicare  Release of information consent forms completed and submitted to medical records by CSW.  Patient to Follow up at: Follow-up Information    Follow up with [name] On 08/09/2015.   Why:  Norma from ACT team will pick you up at discharge (2pm) and will transport you home.    Contact information:   [address] [place], [place] [address] Phone: [phone] Fax: [phone]      Next level of care provider has access to [facility] Link:no  Safety Planning and Suicide Prevention discussed: Yes,  SPE completed with pt, as she refused to consent to family contact. SPI pamphlet and mobile crisis information provided to pt.   Have you used any form of tobacco in the last 30 days? (Cigarettes, Smokeless Tobacco, Cigars, and/or Pipes): No  Has patient been referred to the Quitline?: N/A patient is not a smoker  Patient has been referred for addiction treatment: Pt. refused referral-pt given [facility] calender and encouraged to go to S/A and MH Groups (free).   Smart, Yeng Frankie LCSW 08/09/2015, 11:17 AM

## 2015-08-09 NOTE — Progress Notes (Signed)
Discharge Note:   Patient discharged with Act Team.  Denied SI and HI.  Denied A/V hallucinations.  Denied pain.  Patient stated she received all her belongings, prescriptions, medications, clothing, toiletries, misc items, phone, charger, tote bag, razor, med bottles, purse, wallet, keys cards, $31, red box, necklace, charm, etc.  Suicide prevention information given and discussed with patient who stated she understood and had no questions.  Patient stated she appreciated all assistance received from [facility] staff.

## 2015-09-07 ENCOUNTER — Emergency Department (HOSPITAL_COMMUNITY)
Admission: EM | Admit: 2015-09-07 | Discharge: 2015-09-08 | Disposition: A | Discharge status: Other Acute Inpatient Hospital | Payer: Medicare Other | Attending: Emergency Medicine | Admitting: Emergency Medicine

## 2015-09-07 DIAGNOSIS — F419 Anxiety disorder, unspecified: Secondary | ICD-10-CM | POA: Diagnosis not present

## 2015-09-07 DIAGNOSIS — Z9884 Bariatric surgery status: Secondary | ICD-10-CM | POA: Insufficient documentation

## 2015-09-07 DIAGNOSIS — Z3202 Encounter for pregnancy test, result negative: Secondary | ICD-10-CM | POA: Insufficient documentation

## 2015-09-07 DIAGNOSIS — F319 Bipolar disorder, unspecified: Secondary | ICD-10-CM | POA: Diagnosis not present

## 2015-09-07 DIAGNOSIS — R45851 Suicidal ideations: Secondary | ICD-10-CM

## 2015-09-07 DIAGNOSIS — Z79899 Other long term (current) drug therapy: Secondary | ICD-10-CM | POA: Diagnosis not present

## 2015-09-07 DIAGNOSIS — Z8669 Personal history of other diseases of the nervous system and sense organs: Secondary | ICD-10-CM | POA: Diagnosis not present

## 2015-09-07 LAB — URINALYSIS, ROUTINE W REFLEX MICROSCOPIC
Bilirubin Urine: NEGATIVE
GLUCOSE, UA: NEGATIVE mg/dL
HGB URINE DIPSTICK: NEGATIVE
Ketones, ur: NEGATIVE mg/dL
Leukocytes, UA: NEGATIVE
Nitrite: NEGATIVE
PROTEIN: NEGATIVE mg/dL
Specific Gravity, Urine: 1.021 (ref 1.005–1.030)
pH: 5.5 (ref 5.0–8.0)

## 2015-09-07 LAB — COMPREHENSIVE METABOLIC PANEL
ALBUMIN: 3.9 g/dL (ref 3.5–5.0)
ALT: 26 U/L (ref 14–54)
ANION GAP: 16 — AB (ref 5–15)
AST: 54 U/L — ABNORMAL HIGH (ref 15–41)
Alkaline Phosphatase: 80 U/L (ref 38–126)
BILIRUBIN TOTAL: 0.2 mg/dL — AB (ref 0.3–1.2)
BUN: 5 mg/dL — ABNORMAL LOW (ref 6–20)
CO2: 27 mmol/L (ref 22–32)
Calcium: 8.3 mg/dL — ABNORMAL LOW (ref 8.9–10.3)
Chloride: 101 mmol/L (ref 101–111)
Creatinine, Ser: 0.59 mg/dL (ref 0.44–1.00)
GFR calc Af Amer: >60 mL/min (ref >60)
Glucose, Bld: 101 mg/dL — ABNORMAL HIGH (ref 65–99)
POTASSIUM: 4.3 mmol/L (ref 3.5–5.1)
Sodium: 144 mmol/L (ref 135–145)
TOTAL PROTEIN: 7 g/dL (ref 6.5–8.1)

## 2015-09-07 LAB — CBC WITH DIFFERENTIAL/PLATELET
BASOS ABS: 0 10*3/uL (ref 0.0–0.1)
Basophils Relative: 0 %
Eosinophils Absolute: 0 10*3/uL (ref 0.0–0.7)
Eosinophils Relative: 0 %
HEMATOCRIT: 42.9 % (ref 36.0–46.0)
Hemoglobin: 14.6 g/dL (ref 12.0–15.0)
LYMPHS PCT: 46 %
Lymphs Abs: 2.5 10*3/uL (ref 0.7–4.0)
MCH: 32.9 pg (ref 26.0–34.0)
MCHC: 34 g/dL (ref 30.0–36.0)
MCV: 96.6 fL (ref 78.0–100.0)
Monocytes Absolute: 0.3 10*3/uL (ref 0.1–1.0)
Monocytes Relative: 6 %
NEUTROS ABS: 2.5 10*3/uL (ref 1.7–7.7)
NEUTROS PCT: 48 %
PLATELETS: 164 10*3/uL (ref 150–400)
RBC: 4.44 MIL/uL (ref 3.87–5.11)
RDW: 13.4 % (ref 11.5–15.5)
WBC: 5.3 10*3/uL (ref 4.0–10.5)

## 2015-09-07 LAB — RAPID URINE DRUG SCREEN, HOSP PERFORMED
Amphetamines: NOT DETECTED
BARBITURATES: NOT DETECTED
BENZODIAZEPINES: NOT DETECTED
COCAINE: NOT DETECTED
Opiates: NOT DETECTED
TETRAHYDROCANNABINOL: NOT DETECTED

## 2015-09-07 LAB — PREGNANCY, URINE: Preg Test, Ur: NEGATIVE

## 2015-09-07 LAB — ETHANOL: Alcohol, Ethyl (B): 324 mg/dL (ref <5)

## 2015-09-07 LAB — VALPROIC ACID LEVEL: Valproic Acid Lvl: <10 ug/mL — ABNORMAL LOW (ref 50.0–100.0)

## 2015-09-07 MED ORDER — HYDROXYZINE HCL 25 MG PO TABS: 50.0000 mg | ORAL_TABLET | Freq: Four times a day (QID) | ORAL | Status: DC | PRN

## 2015-09-07 MED ORDER — BUPROPION HCL 100 MG PO TABS
100.0000 mg | ORAL_TABLET | Freq: Two times a day (BID) | ORAL | Status: DC
  Administered 2015-09-07: 100 mg via ORAL
  Filled 2015-09-07 (×3): qty 1

## 2015-09-07 MED ORDER — DIVALPROEX SODIUM ER 500 MG PO TB24
750.0000 mg | ORAL_TABLET | Freq: Two times a day (BID) | ORAL | Status: DC
  Administered 2015-09-07: 750 mg via ORAL
  Filled 2015-09-07 (×4): qty 1

## 2015-09-07 MED ORDER — ONDANSETRON HCL 4 MG PO TABS: 4.0000 mg | ORAL_TABLET | Freq: Three times a day (TID) | ORAL | Status: DC | PRN

## 2015-09-07 MED ORDER — OLANZAPINE 10 MG PO TABS
10.0000 mg | ORAL_TABLET | Freq: Every day | ORAL | Status: DC
  Administered 2015-09-07: 10 mg via ORAL
  Filled 2015-09-07: qty 1

## 2015-09-07 MED ORDER — GABAPENTIN 100 MG PO CAPS
100.0000 mg | ORAL_CAPSULE | Freq: Three times a day (TID) | ORAL | Status: DC
  Administered 2015-09-07 (×2): 100 mg via ORAL
  Filled 2015-09-07 (×2): qty 1

## 2015-09-07 MED ORDER — IBUPROFEN 200 MG PO TABS
600.0000 mg | ORAL_TABLET | Freq: Three times a day (TID) | ORAL | Status: DC | PRN
  Administered 2015-09-07: 600 mg via ORAL
  Filled 2015-09-07: qty 3

## 2015-09-07 MED ORDER — VITAMIN B-1 100 MG PO TABS
100.0000 mg | ORAL_TABLET | Freq: Every day | ORAL | Status: DC
  Administered 2015-09-07: 100 mg via ORAL
  Filled 2015-09-07: qty 1

## 2015-09-07 MED ORDER — TOPIRAMATE 25 MG PO TABS
25.0000 mg | ORAL_TABLET | Freq: Every day | ORAL | Status: DC
  Administered 2015-09-07: 25 mg via ORAL
  Filled 2015-09-07: qty 1

## 2015-09-07 MED ORDER — ACETAMINOPHEN 325 MG PO TABS
650.0000 mg | ORAL_TABLET | ORAL | Status: DC | PRN
  Administered 2015-09-07: 650 mg via ORAL
  Filled 2015-09-07: qty 2

## 2015-09-07 MED ORDER — THIAMINE HCL 100 MG/ML IJ SOLN: 100.0000 mg | Freq: Every day | INTRAMUSCULAR | Status: DC

## 2015-09-07 MED ORDER — LORAZEPAM 1 MG PO TABS
0.0000 mg | ORAL_TABLET | Freq: Four times a day (QID) | ORAL | Status: DC
  Administered 2015-09-07: 1 mg via ORAL
  Filled 2015-09-07: qty 1

## 2015-09-07 MED ORDER — TRAZODONE HCL 50 MG PO TABS
150.0000 mg | ORAL_TABLET | Freq: Every day | ORAL | Status: DC
  Administered 2015-09-07: 150 mg via ORAL
  Filled 2015-09-07: qty 1

## 2015-09-07 MED ORDER — LORAZEPAM 1 MG PO TABS
0.0000 mg | ORAL_TABLET | Freq: Two times a day (BID) | ORAL | Status: DC
Start: 2015-09-09 — End: 2015-09-08
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
## 2015-09-07 NOTE — ED Notes (Signed)
Pt. To [facility] from TCU ambulatory without difficulty, to room 34 . Report from [facility]. Pt. Is alert and oriented, warm and dry in no distress. Pt. Denies HI, and AVH. Pt. States she still has SI without a plan. Pt. Calm and cooperative. Pt. Made aware of security cameras and Q15 minute rounds. Pt. Encouraged to let Nursing staff know of any concerns or needs.

## 2015-09-07 NOTE — ED Notes (Signed)
Pt from home.  Recently started living in her own apartment and hasn't lived alone before.  Tearful when asked about her situation.  States she would kill herself if she had a good plan but doesn't have one.  States she's thought through all kinds of ways to kill herself but hasn't been able to follow through.  Hasn't had her meds x last 3 days per EMS report.

## 2015-09-07 NOTE — BH Assessment (Signed)
Assessment completed. Consulted Alberteen Sam, NP who recommended inpatient treatment. TTS to seek placement when pt's BAL is below 200. Informed Dr. Rubin Payor of the recommendation.

## 2015-09-07 NOTE — ED Provider Notes (Signed)
CSN: [id number]     Arrival date & time 09/07/15  1534 History   First MD Initiated Contact with Patient 09/07/15 1600     No chief complaint on file.   The history is provided by the patient.   patient presents with suicidal thoughts and depression. History of same. History of bipolar and alcoholism. States she is more depressed because she is living by herself. She's been living with supper last 2 months states she cannot do it. She has been seen by psychiatry a couple times for the same thing recently. Previous history of suicide attempts. Also states she's been drinking alcohol. States she drinks about 2 bottles of wine a day.  Past Medical History  Diagnosis Date  . Depression   . Bipolar 1 disorder (HCC)   . Alcoholism (HCC)   . PTSD (post-traumatic stress disorder)   . Carpal tunnel syndrome on both sides   . Gastric bypass status for obesity   . Anxiety    Past Surgical History  Procedure Laterality Date  . Gastric bypass     Family History  Problem Relation Age of Onset  . Alcoholism Other     many relatives on mother side with alcohol abuse issues   Social History  Substance Use Topics  . Smoking status: Never Smoker   . Smokeless tobacco: Never Used  . Alcohol Use: Yes     Comment: Binge Drinking    OB History    Gravida Para Term Preterm AB TAB SAB Ectopic Multiple Living   0 0 0 0 0 0 0 0       Review of Systems  Constitutional: Negative for appetite change.  Respiratory: Negative for shortness of breath.   Cardiovascular: Negative for chest pain.  Gastrointestinal: Negative for nausea and abdominal pain.  Genitourinary: Negative for flank pain.  Musculoskeletal: Negative for back pain and joint swelling.  Neurological: Negative for seizures.  Psychiatric/Behavioral: Positive for suicidal ideas and dysphoric mood.      Allergies  Horse-derived products and Lactose intolerance (gi)  Home Medications   Prior to Admission medications   Medication Sig  Start Date End Date Taking? Authorizing Provider  buPROPion (WELLBUTRIN) 100 MG tablet Take 1 tablet (100 mg total) by mouth 2 (two) times daily. For depression 08/09/15  Yes Sanjuana Kava, NP  divalproex (DEPAKOTE ER) 250 MG 24 hr tablet Take 3 tablets (750 mg total) by mouth 2 (two) times daily. For mood stabilization 08/09/15  Yes Sanjuana Kava, NP  gabapentin (NEURONTIN) 100 MG capsule Take 1 capsule (100 mg total) by mouth 3 (three) times daily. For agitation 08/09/15  Yes Sanjuana Kava, NP  hydrOXYzine (ATARAX/VISTARIL) 50 MG tablet Take 1 tablet (50 mg total) by mouth every 6 (six) hours as needed for anxiety. 08/09/15  Yes Sanjuana Kava, NP  LORazepam (ATIVAN) 1 MG tablet Take 1 tablet (1 mg) three times daily: For severe anxiety 08/09/15  Yes Sanjuana Kava, NP  OLANZapine (ZYPREXA) 10 MG tablet Take 1 tablet (10 mg total) by mouth at bedtime. For mood control 08/09/15  Yes Sanjuana Kava, NP  topiramate (TOPAMAX) 25 MG tablet Take 1 tablet (25 mg total) by mouth at bedtime. For mood stabilization 08/09/15  Yes Sanjuana Kava, NP  traZODone (DESYREL) 150 MG tablet Take 1 tablet (150 mg total) by mouth at bedtime. For sleep 08/09/15  Yes Sanjuana Kava, NP   BP 113/55 mmHg  Pulse 78  Temp(Src) 98.5 F (36.9  C) (Oral)  Resp 22  SpO2 95%  LMP 08/24/2015 Physical Exam  Constitutional: She appears well-developed.  HENT:  Head: Normocephalic.  Neck: Neck supple.  Cardiovascular: Regular rhythm.   Pulmonary/Chest: Effort normal.  Abdominal: Soft. There is no tenderness.  Musculoskeletal: Normal range of motion.  Psychiatric:  Patient is tearful.    ED Course  Procedures (including critical care time) Labs Review Labs Reviewed  COMPREHENSIVE METABOLIC PANEL - Abnormal; Notable for the following:    Glucose, Bld 101 (*)    BUN 5 (*)    Calcium 8.3 (*)    AST 54 (*)    Total Bilirubin 0.2 (*)    Anion gap 16 (*)    All other components within normal limits  ETHANOL - Abnormal; Notable for the  following:    Alcohol, Ethyl (B) 324 (*)    All other components within normal limits  URINALYSIS, ROUTINE W REFLEX MICROSCOPIC (NOT AT [facility]) - Abnormal; Notable for the following:    APPearance CLOUDY (*)    All other components within normal limits  VALPROIC ACID LEVEL - Abnormal; Notable for the following:    Valproic Acid Lvl <10 (*)    All other components within normal limits  CBC WITH DIFFERENTIAL/PLATELET  URINE RAPID DRUG SCREEN, HOSP PERFORMED  PREGNANCY, URINE    Imaging Review No results found. I have personally reviewed and evaluated these images and lab results as part of my medical decision-making.   EKG Interpretation None      MDM   Final diagnoses:  Suicidal ideation    Patient is depressed and suicidal. History of same. States she cannot handle living by herself. she is medically cleared and has been seen by TTS and recommended inpatient treatment.  Benjiman Core, MD 09/08/15 [phone]

## 2015-09-07 NOTE — ED Notes (Signed)
Unable to move pt to [facility] due to elevated ETOH 326, pt moved to [facility].

## 2015-09-07 NOTE — ED Notes (Signed)
Bed: ZO10 Expected date:  Expected time:  Means of arrival:  Comments: SI

## 2015-09-07 NOTE — ED Notes (Addendum)
Pt reports SI without plan and denies A/VH at present time.

## 2015-09-08 ENCOUNTER — Inpatient Hospital Stay (HOSPITAL_COMMUNITY)
Admission: AD | Admit: 2015-09-08 | Discharge: 2015-09-13 | DRG: 897 | Disposition: A | Discharge status: Home / Self Care | Payer: Medicare Other | Source: Intra-hospital | Attending: Psychiatry | Admitting: Psychiatry

## 2015-09-08 ENCOUNTER — Encounter (HOSPITAL_COMMUNITY): Payer: Self-pay | Admitting: *Deleted

## 2015-09-08 DIAGNOSIS — G4700 Insomnia, unspecified: Secondary | ICD-10-CM | POA: Diagnosis present

## 2015-09-08 DIAGNOSIS — F10229 Alcohol dependence with intoxication, unspecified: Principal | ICD-10-CM | POA: Diagnosis present

## 2015-09-08 DIAGNOSIS — F1020 Alcohol dependence, uncomplicated: Secondary | ICD-10-CM | POA: Diagnosis present

## 2015-09-08 DIAGNOSIS — R45851 Suicidal ideations: Secondary | ICD-10-CM

## 2015-09-08 DIAGNOSIS — F3130 Bipolar disorder, current episode depressed, mild or moderate severity, unspecified: Secondary | ICD-10-CM | POA: Diagnosis present

## 2015-09-08 DIAGNOSIS — F319 Bipolar disorder, unspecified: Secondary | ICD-10-CM | POA: Diagnosis present

## 2015-09-08 DIAGNOSIS — Z9884 Bariatric surgery status: Secondary | ICD-10-CM

## 2015-09-08 LAB — LIPID PANEL
CHOL/HDL RATIO: 1.5 ratio
Cholesterol: 197 mg/dL (ref 0–200)
HDL: 134 mg/dL (ref >40)
LDL CALC: 58 mg/dL (ref 0–99)
TRIGLYCERIDES: 24 mg/dL (ref <150)
VLDL: 5 mg/dL (ref 0–40)

## 2015-09-08 MED ORDER — LOPERAMIDE HCL 2 MG PO CAPS: 2.0000 mg | ORAL_CAPSULE | ORAL | Status: AC | PRN

## 2015-09-08 MED ORDER — ADULT MULTIVITAMIN W/MINERALS CH
1.0000 | ORAL_TABLET | Freq: Every day | ORAL | Status: DC
  Administered 2015-09-08 – 2015-09-13 (×6): 1 via ORAL
  Filled 2015-09-08 (×7): qty 1

## 2015-09-08 MED ORDER — LORAZEPAM 1 MG PO TABS
1.0000 mg | ORAL_TABLET | Freq: Four times a day (QID) | ORAL | Status: AC
Start: 2015-09-08 — End: 2015-09-08
  Administered 2015-09-08 (×4): 1 mg via ORAL
  Filled 2015-09-08 (×3): qty 1

## 2015-09-08 MED ORDER — GABAPENTIN 100 MG PO CAPS
100.0000 mg | ORAL_CAPSULE | Freq: Three times a day (TID) | ORAL | Status: DC
  Administered 2015-09-08 – 2015-09-13 (×16): 100 mg via ORAL
  Filled 2015-09-08 (×21): qty 1

## 2015-09-08 MED ORDER — TRAZODONE HCL 150 MG PO TABS
150.0000 mg | ORAL_TABLET | Freq: Every day | ORAL | Status: DC
  Administered 2015-09-08 – 2015-09-12 (×5): 150 mg via ORAL
  Filled 2015-09-08 (×6): qty 1

## 2015-09-08 MED ORDER — BUPROPION HCL 100 MG PO TABS
100.0000 mg | ORAL_TABLET | Freq: Two times a day (BID) | ORAL | Status: DC
  Administered 2015-09-08 – 2015-09-10 (×6): 100 mg via ORAL
  Filled 2015-09-08 (×9): qty 1

## 2015-09-08 MED ORDER — ONDANSETRON 4 MG PO TBDP: 4.0000 mg | ORAL_TABLET | Freq: Four times a day (QID) | ORAL | Status: AC | PRN

## 2015-09-08 MED ORDER — LORAZEPAM 1 MG PO TABS
1.0000 mg | ORAL_TABLET | Freq: Two times a day (BID) | ORAL | Status: AC
  Administered 2015-09-10 (×2): 1 mg via ORAL
  Filled 2015-09-08 (×2): qty 1

## 2015-09-08 MED ORDER — TOPIRAMATE 25 MG PO TABS
25.0000 mg | ORAL_TABLET | Freq: Every day | ORAL | Status: DC
  Administered 2015-09-08 – 2015-09-09 (×2): 25 mg via ORAL
  Filled 2015-09-08 (×4): qty 1

## 2015-09-08 MED ORDER — ALUM & MAG HYDROXIDE-SIMETH 200-200-20 MG/5ML PO SUSP: 30.0000 mL | ORAL | Status: DC | PRN

## 2015-09-08 MED ORDER — VITAMIN B-1 100 MG PO TABS
100.0000 mg | ORAL_TABLET | Freq: Every day | ORAL | Status: DC
  Administered 2015-09-08 – 2015-09-13 (×6): 100 mg via ORAL
  Filled 2015-09-08 (×6): qty 1

## 2015-09-08 MED ORDER — MAGNESIUM HYDROXIDE 400 MG/5ML PO SUSP: 30.0000 mL | Freq: Every day | ORAL | Status: DC | PRN

## 2015-09-08 MED ORDER — OLANZAPINE 10 MG PO TABS
10.0000 mg | ORAL_TABLET | Freq: Every day | ORAL | Status: DC
  Administered 2015-09-08 – 2015-09-09 (×2): 10 mg via ORAL
  Filled 2015-09-08 (×4): qty 1

## 2015-09-08 MED ORDER — HYDROXYZINE HCL 25 MG PO TABS
25.0000 mg | ORAL_TABLET | Freq: Four times a day (QID) | ORAL | Status: AC | PRN
  Administered 2015-09-08 – 2015-09-09 (×2): 25 mg via ORAL
  Filled 2015-09-08 (×2): qty 1

## 2015-09-08 MED ORDER — LORAZEPAM 1 MG PO TABS: 1.0000 mg | ORAL_TABLET | Freq: Four times a day (QID) | ORAL | Status: AC | PRN

## 2015-09-08 MED ORDER — LORAZEPAM 1 MG PO TABS
1.0000 mg | ORAL_TABLET | Freq: Three times a day (TID) | ORAL | Status: AC
  Administered 2015-09-09 (×3): 1 mg via ORAL
  Filled 2015-09-08 (×4): qty 1

## 2015-09-08 MED ORDER — DIVALPROEX SODIUM ER 500 MG PO TB24
750.0000 mg | ORAL_TABLET | Freq: Two times a day (BID) | ORAL | Status: DC
  Administered 2015-09-08: 250 mg via ORAL
  Administered 2015-09-08 – 2015-09-13 (×10): 750 mg via ORAL
  Filled 2015-09-08 (×14): qty 1

## 2015-09-08 MED ORDER — LORAZEPAM 1 MG PO TABS
1.0000 mg | ORAL_TABLET | Freq: Every day | ORAL | Status: AC
  Administered 2015-09-11: 1 mg via ORAL
  Filled 2015-09-08: qty 1

## 2015-09-08 MED ORDER — ACETAMINOPHEN 325 MG PO TABS
650.0000 mg | ORAL_TABLET | Freq: Four times a day (QID) | ORAL | Status: DC | PRN
  Administered 2015-09-08 – 2015-09-09 (×3): 650 mg via ORAL
  Filled 2015-09-08 (×3): qty 2
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
## 2015-09-08 NOTE — [facility] Group Notes (Signed)
[facility] Group Notes: (Clinical Social Work)   09/08/2015      Type of Therapy:  Group Therapy   Participation Level:  Did Not Attend despite MHT prompting   Ambrose Mantle, LCSW 09/08/2015, 12:24 PM

## 2015-09-08 NOTE — Progress Notes (Signed)
Patient did attend the evening speaker AA meeting.  

## 2015-09-08 NOTE — Progress Notes (Signed)
D.  Pt in bed on assessment, remains very depressed and tearful.  No acute distress or physical complaints voiced.  Pt has not felt well enough to attend groups yet, has mostly isolated to room today, but did have a very early admission this morning.  Pt continues to endorse passive SI but contracts for safety on unit.  No HI or hallucinations voiced.  A.  Support and encouragement offered, medication given as ordered  R.  Pt remains safe on unit, will continue to monitor.

## 2015-09-08 NOTE — ED Notes (Signed)
Pt. Noted sleeping in room. No complaints or concerns voiced. No distress or abnormal behavior noted. Will continue to monitor with security cameras. Q 15 minute rounds continue. 

## 2015-09-08 NOTE — Progress Notes (Addendum)
Pt is very tired and she has a blunted affect. Pt did contract for safety and does at times have passive SI. Pt was given her scheduled ativan for withdrawals. Pt stated her goal today is not to want to  die. CIWA is a 5. (2pm) Pt was given a [place] sandwich and was much appreciated. Pt was given 2 tylenol for a headache 7/10. She does have tremors and stated Friday nite she drank three bottles of wine. Pt rates her depression a 10/10 and her hopelessness a 10/10. Anxiety is a 10/10 too. 6:30PM - Pt took a shower and stated she was feeling much better. Pt was given a pitcher of gatorade. 7pm _Report given to [place].

## 2015-09-08 NOTE — BH Assessment (Signed)
Pt has been accepted to [facility] Room 300 Bed 1 to the services of Dr. Dub Mikes. Pt can be transported after 4:30 am. Informed Dr. Nicanor Alcon of the disposition.
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
## 2015-09-08 NOTE — Progress Notes (Signed)
Patient ID: Mary Harper, female   DOB: 10/23/1965, 50 y.o.   MRN: [id number] Per State regulations 482.30 this chart was reviewed for medical necessity with respect to the patient's admission/duration of stay.    Next review date: 09/12/15  Thurman Coyer, BSN, RN-BC  Case Manager

## 2015-09-08 NOTE — [facility] Group Notes (Signed)
[facility] Group Notes:  Healthy Coping Skills  Date:  09/08/2015  Time: 1300  Type of Therapy:  Nurse Education  Participation Level:  Did Not Attend  Participation Quality:  Inattentive  Affect:  Flat  Cognitive:  Lacking  Insight:  None  Engagement in Group:  None  Modes of Intervention:  Discussion  Summary of Progress/Problems:Pt did not attend. She arrived at [facility] at 4:30am and stated she needed to sleep.  Rodman Key [facility] 09/08/2015, 1:59 PM

## 2015-09-08 NOTE — Tx Team (Signed)
Initial Interdisciplinary Treatment Plan   PATIENT STRESSORS: Marital or family conflict Substance abuse   PATIENT STRENGTHS: Average or above average intelligence Motivation for treatment/growth   PROBLEM LIST: Problem List/Patient Goals Date to be addressed Date deferred Reason deferred Estimated date of resolution  Suicidal Ideation 09/08/15     Depression 09/08/15     Substance Abuse 09/08/15     "Stop wanting to die"                                     DISCHARGE CRITERIA:  Improved stabilization in mood, thinking, and/or behavior Motivation to continue treatment in a less acute level of care Need for constant or close observation no longer present Withdrawal symptoms are absent or subacute and managed without 24-hour nursing intervention  PRELIMINARY DISCHARGE PLAN: Attend 12-step recovery group Outpatient therapy Return to previous living arrangement  PATIENT/FAMIILY INVOLVEMENT: This treatment plan has been presented to and reviewed with the patient, Mary Harper.  The patient and family have been given the opportunity to ask questions and make suggestions.  Juliann Pares 09/08/2015, 5:16 AM

## 2015-09-09 LAB — HEMOGLOBIN A1C
HEMOGLOBIN A1C: 5 % (ref 4.8–5.6)
MEAN PLASMA GLUCOSE: 97 mg/dL

## 2015-09-09 NOTE — Progress Notes (Signed)
Patient ID: Mary Harper, female   DOB: [birth year], 50 y.o.   MRN: [id number]  DAR: Pt. Denies HI and A/V Hallucinations. She reports passive SI but is able to contract for safety. Patient reports a headache and received PRN Tylenol this morning which patient reports later that it did help. She reports mild withdrawal symptoms including tremors, nausea, and anxiety. She reports sleep is fair, appetite is poor, energy level is low, and concentration is poor. She rates depression, anxiety, and hopelessness 9/10. Support and encouragement provided to the patient. Scheduled medications administered to patient per physician's orders. Patient is minimal and stays in her room for the majority of the day. Q15 minute checks are maintained for safety.

## 2015-09-09 NOTE — [facility] Group Notes (Signed)
[facility] LCSW Aftercare Discharge Planning Group Note   09/09/2015 10:51 AM  Participation Quality:  Minimal   Mood/Affect:  Anxious and Depressed  Depression Rating:  9  Anxiety Rating:  9  Thoughts of Suicide:  No Will you contract for safety?   NA  Current AVH:  No  Plan for Discharge/Comments:  Pt reports that she slept "better than before" last night. Reports moderate withdrawals including "pins an needles and shakiness." Pt sees [name] and reports that she lives in apt alone.   Transportation Means: bus or ACT team   Supports: ACT team. Pt has guardian but states that he may not want to continue as her guardian. Mary Harper  Smart, Mary Koble LCSW

## 2015-09-09 NOTE — [facility] Group Notes (Signed)
[facility] LCSW Group Therapy  09/09/2015 1:01 PM  Type of Therapy:  Group Therapy  Participation Level:  Active  Participation Quality:  Attentive  Affect:  Depressed and Tearful  Cognitive:  Oriented  Insight:  Limited  Engagement in Therapy:  Improving  Modes of Intervention:  Confrontation, Discussion, Education, Exploration, Problem-solving, Rapport Building, Socialization and Support  Summary of Progress/Problems: Today's Topic: Overcoming Obstacles. Patients identified one short term goal and potential obstacles in reaching this goal. Patients processed barriers involved in overcoming these obstacles. Patients identified steps necessary for overcoming these obstacles and explored motivation (internal and external) for facing these difficulties head on. Mary Harper shared that she is continuing to struggle with depression and subsequent relapse on alcohol. "I know that's not a smart move but I got so down I didn't care." Mary Harper shared that she believes she needs a higher level of care "maybe long term treatment." She was open to a [facility] referral.   Smart, [name] LCSW 09/09/2015, 1:01 PM

## 2015-09-09 NOTE — [facility] Notes (Signed)
Referral sent to: [facility] per patient request.   Trula Slade, MSW, LCSW Clinical Social Worker 09/09/2015 4:08 PM

## 2015-09-09 NOTE — [facility] Group Notes (Signed)
Pt attended AA group.  Maricus Tanzi, MHT 
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
## 2015-09-09 NOTE — Progress Notes (Signed)
[facility] MD Progress Note  09/09/2015 7:48 PM Mary Harper  MRN:  [id number] Subjective:  Mary Harper continues to endorse that she feels severely depressed. States the depression led her to relapse. She continues to endorse that living by herself, the loneliness triggers the depression. She gets hopeless helpless and starts thinking about killing herself Principal Problem: Alcohol dependence, binge pattern (HCC) Diagnosis:   Patient Active Problem List   Diagnosis Date Noted  . Suicidal ideations [R45.851] 10/03/2014    Priority: High  . Bipolar affective disorder, depressed, severe (HCC) [F31.4] 07/13/2014    Priority: High  . PTSD (post-traumatic stress disorder) [F43.10] 03/12/2015  . Alcohol use disorder, severe, dependence (HCC) [F10.20] 12/02/2014  . Bipolar I disorder, most recent episode depressed (HCC) [F31.30] 12/02/2014  . Alcohol dependence, binge pattern (HCC) [F10.20] 07/14/2014  . Alcohol intoxication (HCC) [F10.129]    Total Time spent with patient: 20 minutes  Past Psychiatric History: see admission H and P  Past Medical History:  Past Medical History  Diagnosis Date  . Depression   . Bipolar 1 disorder (HCC)   . Alcoholism (HCC)   . PTSD (post-traumatic stress disorder)   . Carpal tunnel syndrome on both sides   . Gastric bypass status for obesity   . Anxiety     Past Surgical History  Procedure Laterality Date  . Gastric bypass     Family History:  Family History  Problem Relation Age of Onset  . Alcoholism Other     many relatives on mother side with alcohol abuse issues   Family Psychiatric  History: see admission H and P Social History:  History  Alcohol Use  . Yes    Comment: Binge Drinking      History  Drug Use No    Comment: binge drink about every 3 weeks     Social History   Social History  . Marital Status: Single    Spouse Name: N/A  . Number of Children: N/A  . Years of Education: N/A   Social History Main Topics  . Smoking status: Never  Smoker   . Smokeless tobacco: Never Used  . Alcohol Use: Yes     Comment: Binge Drinking   . Drug Use: No     Comment: binge drink about every 3 weeks   . Sexual Activity: Not Currently   Other Topics Concern  . None   Social History Narrative   ** Merged History Encounter **       Additional Social History:                         Sleep: Fair  Appetite:  Poor  Current Medications: Current Facility-Administered Medications  Medication Dose Route Frequency Provider Last Rate Last Dose  . acetaminophen (TYLENOL) tablet 650 mg  650 mg Oral Q6H PRN Kristeen Mans, NP   650 mg at 09/09/15 0846  . alum & mag hydroxide-simeth (MAALOX/MYLANTA) 200-200-20 MG/5ML suspension 30 mL  30 mL Oral Q4H PRN Kristeen Mans, NP      . buPROPion [facility]) tablet 100 mg  100 mg Oral BID Kristeen Mans, NP   100 mg at 09/09/15 1724  . divalproex (DEPAKOTE ER) 24 hr tablet 750 mg  750 mg Oral BID Kristeen Mans, NP   750 mg at 09/09/15 0845  . gabapentin (NEURONTIN) capsule 100 mg  100 mg Oral TID Kristeen Mans, NP   100 mg at 09/09/15 1724  .  hydrOXYzine (ATARAX/VISTARIL) tablet 25 mg  25 mg Oral Q6H PRN Kristeen Mans, NP   25 mg at 09/08/15 0841  . loperamide (IMODIUM) capsule 2-4 mg  2-4 mg Oral PRN Kristeen Mans, NP      . LORazepam (ATIVAN) tablet 1 mg  1 mg Oral Q6H PRN Kristeen Mans, NP      . Melene Muller ON 09/10/2015] LORazepam (ATIVAN) tablet 1 mg  1 mg Oral BID Kristeen Mans, NP       Followed by  . [START ON 09/11/2015] LORazepam (ATIVAN) tablet 1 mg  1 mg Oral Daily Kristeen Mans, NP      . magnesium hydroxide (MILK OF MAGNESIA) suspension 30 mL  30 mL Oral Daily PRN Kristeen Mans, NP      . multivitamin with minerals tablet 1 tablet  1 tablet Oral Daily Kristeen Mans, NP   1 tablet at 09/09/15 0845  . OLANZapine (ZYPREXA) tablet 10 mg  10 mg Oral QHS Kristeen Mans, NP   10 mg at 09/08/15 2123  . ondansetron (ZOFRAN-ODT) disintegrating tablet 4 mg  4 mg Oral Q6H PRN Kristeen Mans, NP       . thiamine (VITAMIN B-1) tablet 100 mg  100 mg Oral Daily Kristeen Mans, NP   100 mg at 09/09/15 0845  . topiramate (TOPAMAX) tablet 25 mg  25 mg Oral QHS Kristeen Mans, NP   25 mg at 09/08/15 2123  . traZODone (DESYREL) tablet 150 mg  150 mg Oral QHS Kristeen Mans, NP   150 mg at 09/08/15 2123    Lab Results:  Results for orders placed or performed during the hospital encounter of 09/08/15 (from the past 48 hour(s))  Lipid panel     Status: None   Collection Time: 09/08/15  6:46 AM  Result Value Ref Range   Cholesterol 197 0 - 200 mg/dL   Triglycerides 24 <130 mg/dL   HDL 865 >78 mg/dL   Total CHOL/HDL Ratio 1.5 RATIO   VLDL 5 0 - 40 mg/dL   LDL Cholesterol 58 0 - 99 mg/dL    Comment:        Total Cholesterol/HDL:CHD Risk Coronary Heart Disease Risk Table                     Men   Women  1/2 Average Risk   3.4   3.3  Average Risk       5.0   4.4  2 X Average Risk   9.6   7.1  3 X Average Risk  23.4   11.0        Use the calculated Patient Ratio above and the CHD Risk Table to determine the patient's CHD Risk.        ATP III CLASSIFICATION (LDL):  <100     mg/dL   Optimal  469-629  mg/dL   Near or Above                    Optimal  130-159  mg/dL   Borderline  528-413  mg/dL   High  >244     mg/dL   Very High Performed at [facility]   Hemoglobin A1c     Status: None   Collection Time: 09/08/15  6:46 AM  Result Value Ref Range   Hgb A1c MFr Bld 5.0 4.8 - 5.6 %    Comment: (NOTE)  Pre-diabetes: 5.7 - 6.4         Diabetes: >6.4         Glycemic control for adults with diabetes: <7.0    Mean Plasma Glucose 97 mg/dL    Comment: (NOTE) Performed At: [facility] [address] [place], [place] [id number] Mila Homer MD ON:[id number] Performed at [facility]     Physical Findings: AIMS: Facial and Oral Movements Muscles of Facial Expression: None, normal Lips and Perioral Area: None, normal Jaw: None,  normal Tongue: None, normal,Extremity Movements Upper (arms, wrists, hands, fingers): None, normal Lower (legs, knees, ankles, toes): None, normal, Trunk Movements Neck, shoulders, hips: None, normal, Overall Severity Severity of abnormal movements (highest score from questions above): None, normal Incapacitation due to abnormal movements: None, normal Patient's awareness of abnormal movements (rate only patient's report): No Awareness, Dental Status Current problems with teeth and/or dentures?: No Does patient usually wear dentures?: No  CIWA:  CIWA-Ar Total: 4 COWS:     Musculoskeletal: Strength & Muscle Tone: within normal limits Gait & Station: normal Patient leans: normal  Psychiatric Specialty Exam: Review of Systems  Constitutional: Positive for malaise/fatigue.  HENT: Negative.   Eyes: Negative.   Respiratory: Negative.   Cardiovascular: Negative.   Gastrointestinal: Negative.   Genitourinary: Negative.   Musculoskeletal: Negative.   Skin: Negative.   Neurological: Positive for weakness.  Endo/Heme/Allergies: Negative.   Psychiatric/Behavioral: Positive for depression and substance abuse. The patient is nervous/anxious.     Blood pressure 113/80, pulse 86, temperature 97.9 F (36.6 C), temperature source Oral, resp. rate 16, height  (1.651 m), weight 84.823 kg (187 lb), last menstrual period 08/24/2015.Body mass index is 31.12 kg/(m^2).  General Appearance: Disheveled  Eye Solicitor::  Fair  Speech:  Clear and Coherent and Slow  Volume:  Decreased  Mood:  Anxious, Depressed and Dysphoric  Affect:  Depressed, Restricted and Tearful  Thought Process:  Coherent and Goal Directed  Orientation:  Full (Time, Place, and Person)  Thought Content:  symptoms events worries concerns  Suicidal Thoughts:  Yes.  without intent/plan  Homicidal Thoughts:  No  Memory:  Immediate;   Fair Recent;   Fair Remote;   Fair  Judgement:  Fair  Insight:  Present and Shallow   Psychomotor Activity:  Decreased  Concentration:  Fair  Recall:  [name] of Knowledge:Fair  Language: Fair  Akathisia:  No  Handed:  Right  AIMS (if indicated):     Assets:  Desire for Improvement Housing Social Support  ADL's:  Intact  Cognition: WNL  Sleep:  Number of Hours: 6.5   Treatment Plan Summary: Daily contact with patient to assess and evaluate symptoms and progress in treatment and Medication management Supportive approach/coping skills Alcohol dependence; continue the Ativan detox protocol/work a relapse prevention plan Depression; resume the Wellbutrin optimize dose-response Mood instability; continue the Depakote Insomnia/mood instability; continue the Prozac  Insomnia; continue the Trazodone  Nightmares continue the Topamax ( cant take Prazosin) Work with CBT/mindfulness Explore residential treatment options Randalyn Ahmed A, MD 09/09/2015, 7:48 PM

## 2015-09-09 NOTE — Plan of Care (Signed)
Problem: Alteration in mood Goal: STG-Patient reports thoughts of self-harm to staff Outcome: Progressing Patient reports SI, able to contract for safety. Has a roommate.

## 2015-09-10 MED ORDER — TOPIRAMATE 25 MG PO TABS
50.0000 mg | ORAL_TABLET | Freq: Every day | ORAL | Status: DC
  Administered 2015-09-10 – 2015-09-12 (×3): 50 mg via ORAL
  Filled 2015-09-10 (×5): qty 2

## 2015-09-10 MED ORDER — BUPROPION HCL 100 MG PO TABS
100.0000 mg | ORAL_TABLET | Freq: Two times a day (BID) | ORAL | Status: DC
  Administered 2015-09-11 – 2015-09-13 (×5): 100 mg via ORAL
  Filled 2015-09-10 (×8): qty 1

## 2015-09-10 NOTE — Progress Notes (Signed)
Patient ID: Mary Harper, female   DOB: Oct 24, 1965, 50 y.o.   MRN: [id number]  DAR: Pt. Denies HI and A/V Hallucinations. Patient reports SI but is able to contract for safety. She reports sleep is fair, patient was encouraged to discuss options for medications for sleep with MD if the current is not working. She reports appetite is poor, energy level is low, and concentration is poor. She rates depression 8/10, hopelessness 8/10, and anxiety 9/10. Patient does reports discomfort in chest and EKG was performed. Support and encouragement provided to the patient. Scheduled medications administered to patient per physician's orders. Patient remains anxious and depressed. She reports she needs help finding her own apartment. Q15 minute checks are maintained for safety.

## 2015-09-10 NOTE — Progress Notes (Signed)
Patient ID: Mary Harper, female   DOB: November 07, 1965, 50 y.o.   MRN: [id number]  Patient reported dull chest pain, MD [facility] notified. EKG done and MD [place] shown, EKG print out placed on patient's chart. Patient tolerated well.

## 2015-09-10 NOTE — Progress Notes (Signed)
D. Pt had been up and visible in milieu this evening, did attend evening group activity. Pt did endorse depression and anxiety and appears disheveled and in scrubs. Pt had minimal interaction with fellow peers and after group pt received medications and spoke about how she wanted to go to bed to get some sleep as she was tired. A. Support and encouragement provided, medication education given. R. Pt verbalized understanding, safety maintained.

## 2015-09-10 NOTE — [facility] Group Notes (Signed)
[facility] LCSW Group Therapy  09/10/2015 1:18 PM  Type of Therapy:  Group Therapy  Participation Level:  Did Not Attend-pt chose to remain in bed.  Modes of Intervention:  Confrontation, Discussion, Education, Problem-solving, Rapport Building, Socialization and Support  Summary of Progress/Problems: MHA Speaker came to talk about his personal journey with substance abuse and addiction. The pt processed ways by which to relate to the speaker. MHA speaker provided handouts and educational information pertaining to groups and services offered by the [facility].   Harper, Mary Massar LCSW 09/10/2015, 1:18 PM

## 2015-09-10 NOTE — Progress Notes (Signed)
Message left for [employer] (pt's case worker at [employer]). [phone]. CSW requested call back to discuss potential inpatient treatment options.   Trula Slade, MSW, LCSW Clinical Social Worker 09/10/2015 2:44 PM    Sherren Kerns scheduled visit with pt this afternoon at 3:45PM to discuss housing options if pt decides to go to treatment. Front desk/reception notified.  Trula Slade, MSW, LCSW Clinical Social Worker 09/10/2015 2:45 PM

## 2015-09-10 NOTE — [facility] Group Notes (Signed)
[facility] Group Notes:  (Nursing/MHT/Case Management/Adjunct)  Date:  09/10/2015  Time:  9:30 AM  Type of Therapy:  Psychoeducational Skills  Participation Level:  Minimal  Participation Quality:  Appropriate  Affect:  Anxious  Cognitive:  Appropriate  Insight:  None  Engagement in Group:  None  Modes of Intervention:  Discussion and Education  Summary of Progress/Problems: Patient attended group on the 400 hall today, presumably by mistake however topic of Recovery was discussed as it would be on 300 hall. Patient anxious throughout.   Aunika Kirsten E 09/10/2015, 9:30 AM

## 2015-09-10 NOTE — Progress Notes (Signed)
Message left for [employer] (pt's case worker at [employer]). [phone]. CSW requested call back to discuss potential inpatient treatment options.   Trula Slade, MSW, LCSW Clinical Social Worker 09/10/2015 9:05 AM

## 2015-09-10 NOTE — Plan of Care (Signed)
Problem: Ineffective individual coping Goal: STG-Increase in ability to manage activities of daily living Outcome: Progressing Patient has changed from scrubs to regular clothes. Appears a little less disheveled today.

## 2015-09-10 NOTE — Progress Notes (Signed)
Recreation Therapy Notes  Animal-Assisted Activity (AAA) Program Checklist/Progress Notes Patient Eligibility Criteria Checklist & Daily Group note for Rec Tx Intervention  Date: 02.07.2017 Time: 2:45pm Location: [address]    AAA/T Program Assumption of Risk Form signed by Patient/ or Parent Legal Guardian yes  Patient is free of allergies or sever asthma yes  Patient reports no fear of animals yes  Patient reports no history of cruelty to animals yes  Patient understands his/her participation is voluntary yes  Patient washes hands before animal contact yes  Patient washes hands after animal contact yes  Behavioral Response: Appropriate  Education: Hand Washing, Appropriate Animal Interaction   Education Outcome: Acknowledges education.   Clinical Observations/Feedback: Patient attended session, petting therapy dog and interacting with peers in group appropriately.   Marykay Lex Rilley Poulter, LRT/CTRS  Lucelia Lacey L 09/10/2015 3:05 PM

## 2015-09-10 NOTE — Progress Notes (Signed)
[facility] MD Progress Note  09/10/2015 7:31 PM Mary Harper  MRN:  [id number] Subjective:  Mary Harper is having a hard time. Continues to feel down depressed. She is feeling some pressure on her chest (EKG no evidence of acute pathology other than prolonged QT) she admits to feeling anxious unsure of where to go from here. States that staying in one of these places for a week is not doing it for her. States she beliefs that if she was to go to a longer term residential treatment program she could come back feeling more stable with more tools to deal with her living alone.  Principal Problem: Alcohol dependence, binge pattern (HCC) Diagnosis:   Patient Active Problem List   Diagnosis Date Noted  . Suicidal ideations [R45.851] 10/03/2014    Priority: High  . Bipolar affective disorder, depressed, severe (HCC) [F31.4] 07/13/2014    Priority: High  . PTSD (post-traumatic stress disorder) [F43.10] 03/12/2015  . Alcohol use disorder, severe, dependence (HCC) [F10.20] 12/02/2014  . Bipolar I disorder, most recent episode depressed (HCC) [F31.30] 12/02/2014  . Alcohol dependence, binge pattern (HCC) [F10.20] 07/14/2014  . Alcohol intoxication (HCC) [F10.129]    Total Time spent with patient: 20 minutes  Past Psychiatric History: see admission H and P  Past Medical History:  Past Medical History  Diagnosis Date  . Depression   . Bipolar 1 disorder (HCC)   . Alcoholism (HCC)   . PTSD (post-traumatic stress disorder)   . Carpal tunnel syndrome on both sides   . Gastric bypass status for obesity   . Anxiety     Past Surgical History  Procedure Laterality Date  . Gastric bypass     Family History:  Family History  Problem Relation Age of Onset  . Alcoholism Other     many relatives on mother side with alcohol abuse issues   Family Psychiatric  History: see admission H and P Social History:  History  Alcohol Use  . Yes    Comment: Binge Drinking      History  Drug Use No    Comment: binge drink  about every 3 weeks     Social History   Social History  . Marital Status: Single    Spouse Name: N/A  . Number of Children: N/A  . Years of Education: N/A   Social History Main Topics  . Smoking status: Never Smoker   . Smokeless tobacco: Never Used  . Alcohol Use: Yes     Comment: Binge Drinking   . Drug Use: No     Comment: binge drink about every 3 weeks   . Sexual Activity: Not Currently   Other Topics Concern  . None   Social History Narrative   ** Merged History Encounter **       Additional Social History:                         Sleep: Fair  Appetite:  Fair  Current Medications: Current Facility-Administered Medications  Medication Dose Route Frequency Provider Last Rate Last Dose  . acetaminophen (TYLENOL) tablet 650 mg  650 mg Oral Q6H PRN Kristeen Mans, NP   650 mg at 09/09/15 2123  . alum & mag hydroxide-simeth (MAALOX/MYLANTA) 200-200-20 MG/5ML suspension 30 mL  30 mL Oral Q4H PRN Kristeen Mans, NP      . buPROPion [facility]) tablet 100 mg  100 mg Oral BID Kristeen Mans, NP   100 mg at 09/10/15  1711  . divalproex (DEPAKOTE ER) 24 hr tablet 750 mg  750 mg Oral BID Kristeen Mans, NP   750 mg at 09/10/15 0737  . gabapentin (NEURONTIN) capsule 100 mg  100 mg Oral TID Kristeen Mans, NP   100 mg at 09/10/15 1711  . hydrOXYzine (ATARAX/VISTARIL) tablet 25 mg  25 mg Oral Q6H PRN Kristeen Mans, NP   25 mg at 09/09/15 2123  . loperamide (IMODIUM) capsule 2-4 mg  2-4 mg Oral PRN Kristeen Mans, NP      . LORazepam (ATIVAN) tablet 1 mg  1 mg Oral Q6H PRN Kristeen Mans, NP      . Melene Muller ON 09/11/2015] LORazepam (ATIVAN) tablet 1 mg  1 mg Oral Daily Kristeen Mans, NP      . magnesium hydroxide (MILK OF MAGNESIA) suspension 30 mL  30 mL Oral Daily PRN Kristeen Mans, NP      . multivitamin with minerals tablet 1 tablet  1 tablet Oral Daily Kristeen Mans, NP   1 tablet at 09/10/15 0737  . ondansetron (ZOFRAN-ODT) disintegrating tablet 4 mg  4 mg Oral Q6H PRN Kristeen Mans, NP      . thiamine (VITAMIN B-1) tablet 100 mg  100 mg Oral Daily Kristeen Mans, NP   100 mg at 09/10/15 0737  . topiramate (TOPAMAX) tablet 50 mg  50 mg Oral QHS Rachael Fee, MD      . traZODone (DESYREL) tablet 150 mg  150 mg Oral QHS Kristeen Mans, NP   150 mg at 09/09/15 2120    Lab Results: No results found for this or any previous visit (from the past 48 hour(s)).  Physical Findings: AIMS: Facial and Oral Movements Muscles of Facial Expression: None, normal Lips and Perioral Area: None, normal Jaw: None, normal Tongue: None, normal,Extremity Movements Upper (arms, wrists, hands, fingers): None, normal Lower (legs, knees, ankles, toes): None, normal, Trunk Movements Neck, shoulders, hips: None, normal, Overall Severity Severity of abnormal movements (highest score from questions above): None, normal Incapacitation due to abnormal movements: None, normal Patient's awareness of abnormal movements (rate only patient's report): No Awareness, Dental Status Current problems with teeth and/or dentures?: No Does patient usually wear dentures?: No  CIWA:  CIWA-Ar Total: 5 COWS:     Musculoskeletal: Strength & Muscle Tone: within normal limits Gait & Station: normal Patient leans: normal  Psychiatric Specialty Exam: Review of Systems  Constitutional: Positive for malaise/fatigue.  HENT: Negative.   Eyes: Negative.   Respiratory: Negative.   Cardiovascular: Positive for chest pain.  Gastrointestinal: Negative.   Genitourinary: Negative.   Musculoskeletal: Negative.   Skin: Negative.   Neurological: Positive for weakness.  Endo/Heme/Allergies: Negative.   Psychiatric/Behavioral: Positive for depression and substance abuse. The patient is nervous/anxious.     Blood pressure 102/70, pulse 75, temperature 97.7 F (36.5 C), temperature source Oral, resp. rate 16, height  (1.651 m), weight 84.823 kg (187 lb), last menstrual period 08/24/2015.Body mass index is 31.12  kg/(m^2).  General Appearance: Fairly Groomed  Patent attorney::  Fair  Speech:  Clear and Coherent and Slow  Volume:  Decreased  Mood:  Anxious and Depressed  Affect:  Depressed and Restricted  Thought Process:  Coherent and Goal Directed  Orientation:  Full (Time, Place, and Person)  Thought Content:  symptoms events worries concerns  Suicidal Thoughts:  Yes.  without intent/plan  Homicidal Thoughts:  No  Memory:  Immediate;   Fair  Recent;   Fair Remote;   Fair  Judgement:  Fair  Insight:  Present  Psychomotor Activity:  Decreased  Concentration:  Fair  Recall:  [name] of Knowledge:Fair  Language: Fair  Akathisia:  No  Handed:  Right  AIMS (if indicated):     Assets:  Desire for Improvement Housing Social Support  ADL's:  Intact  Cognition: WNL  Sleep:  Number of Hours: 7   Treatment Plan Summary: Daily contact with patient to assess and evaluate symptoms and progress in treatment and Medication management  Supportive approach/coping skills Alcohol dependence; continue the Ativan detox protocol/work a relapse prevention plan Depression; continue the Wellbutrin 100 mg BID ( consider increasing to 150 BID) Mood instability; continue the Depakote 750 mg daily Nightmares; increase the Topamax to 50 mg HS Anxiety-agitation; continue the Neurontin 100 mg TID Prolonged QT; D/C the Zyprexa and reassess Chest pain; continue to monitor Explore long term residential treatment optiions Shalla Bulluck A, MD 09/10/2015, 7:31 PM

## 2015-09-11 NOTE — Progress Notes (Signed)
D. Pt had been in room for much of the evening, limited interaction or participation in milieu. Pt appears depressed and has endorsed such. Pt did receive medications this evening without incident and did not verbalize any complaints of pain. Pt spoke about not knowing where she will be able to go from here which is a stressor for her. A. Support and encouragement provided. R. Safety maintained, will continue to monitor.

## 2015-09-11 NOTE — Progress Notes (Signed)
Patient ID: Mary Harper, female   DOB: 04-15-1966, 50 y.o.   MRN: [id number]  DAR: Pt. Denies HI and A/V Hallucinations. She does continue to report passive SI but does state that they are less frequent. She is able to contract for safety. She reports sleep was fair, appetite is fair, energy level is low, and concentration is poor. She rates depression 7/10, hopelessness 6/10, and anxiety 7/10. Patient does not report any pain or discomfort at this time. She reports her chest pain subsided from yesterday, of note EKG yesterday showed patient's QT interval was prolonged, MD discontinued Zyprexa. Support and encouragement provided to the patient. Scheduled medications administered to patient per physician's orders. Patient is receptive and cooperative. Affect appears brighter today, patient even smiled. Q15 minute checks are maintained for safety.

## 2015-09-11 NOTE — Progress Notes (Signed)
D. Pt had been up and visible in milieu this evening, minimal interaction or participation. Pt spoke about how she is feeing better, however does appear depressed and endorses feelings of such. Pt spoke about plan to go to treatment facility, spoke about how she was sleeping fair and did not verbalize any complaints of pain and did receive all bedtime medications without incident. A. Support provided. R. Safety maintained, will continue to monitor.

## 2015-09-11 NOTE — Plan of Care (Signed)
Problem: Alteration in mood Goal: LTG-Patient reports reduction in suicidal thoughts (Patient reports reduction in suicidal thoughts and is able to verbalize a safety plan for whenever patient is feeling suicidal)  Outcome: Progressing Patient reports, "they are not as frequent"

## 2015-09-11 NOTE — BHH Group Notes (Signed)
[facility] LCSW Aftercare Discharge Planning Group Note   09/11/2015 9:30 AM  Participation Quality:  Appropriate   Mood/Affect:  Appropriate  Depression Rating:  6  Anxiety Rating:  6  Thoughts of Suicide:  No Will you contract for safety?   NA  Current AVH:  No  Plan for Discharge/Comments:  Pt reports that her [facility] with [facility] went well. "I want to go toward the end of the month so I have time to get my apt packed up and in storage." Pt reports feeling "alittle better this morning." Pt reports good sleep.   Transportation Means: unknown at this time/likely her ACT team.   Supports: Case worker through [employer] and [name].   Smart, Raynaldo Falco LCSW

## 2015-09-11 NOTE — Progress Notes (Signed)
[facility] MD Progress Note  09/11/2015 4:00 PM Mary Harper  MRN:  [id number] Subjective:  Mary Harper is more hopeful today. She is going to be admitted to [facility] (3-6 months program) states that she feels she needs that longer term treatment. She is going to let go of her apartment and when she is back they are going to work on placing her in an assisted living facility. Principal Problem: Alcohol dependence, binge pattern (HCC) Diagnosis:   Patient Active Problem List   Diagnosis Date Noted  . Suicidal ideations [R45.851] 10/03/2014    Priority: High  . Bipolar affective disorder, depressed, severe (HCC) [F31.4] 07/13/2014    Priority: High  . PTSD (post-traumatic stress disorder) [F43.10] 03/12/2015  . Alcohol use disorder, severe, dependence (HCC) [F10.20] 12/02/2014  . Bipolar I disorder, most recent episode depressed (HCC) [F31.30] 12/02/2014  . Alcohol dependence, binge pattern (HCC) [F10.20] 07/14/2014  . Alcohol intoxication (HCC) [F10.129]    Total Time spent with patient: 20 minutes  Past Psychiatric History: see admission H and P  Past Medical History:  Past Medical History  Diagnosis Date  . Depression   . Bipolar 1 disorder (HCC)   . Alcoholism (HCC)   . PTSD (post-traumatic stress disorder)   . Carpal tunnel syndrome on both sides   . Gastric bypass status for obesity   . Anxiety     Past Surgical History  Procedure Laterality Date  . Gastric bypass     Family History:  Family History  Problem Relation Age of Onset  . Alcoholism Other     many relatives on mother side with alcohol abuse issues   Family Psychiatric  History: see admission H and P Social History:  History  Alcohol Use  . Yes    Comment: Binge Drinking      History  Drug Use No    Comment: binge drink about every 3 weeks     Social History   Social History  . Marital Status: Single    Spouse Name: N/A  . Number of Children: N/A  . Years of Education: N/A   Social History Main Topics  .  Smoking status: Never Smoker   . Smokeless tobacco: Never Used  . Alcohol Use: Yes     Comment: Binge Drinking   . Drug Use: No     Comment: binge drink about every 3 weeks   . Sexual Activity: Not Currently   Other Topics Concern  . None   Social History Narrative   ** Merged History Encounter **       Additional Social History:                         Sleep: Fair  Appetite:  Fair  Current Medications: Current Facility-Administered Medications  Medication Dose Route Frequency Provider Last Rate Last Dose  . acetaminophen (TYLENOL) tablet 650 mg  650 mg Oral Q6H PRN Kristeen Mans, NP   650 mg at 09/09/15 2123  . alum & mag hydroxide-simeth (MAALOX/MYLANTA) 200-200-20 MG/5ML suspension 30 mL  30 mL Oral Q4H PRN Kristeen Mans, NP      . buPROPion [facility]) tablet 100 mg  100 mg Oral BID Rachael Fee, MD   100 mg at 09/11/15 1450  . divalproex (DEPAKOTE ER) 24 hr tablet 750 mg  750 mg Oral BID Kristeen Mans, NP   750 mg at 09/11/15 0739  . gabapentin (NEURONTIN) capsule 100 mg  100 mg Oral  TID Kristeen Mans, NP   100 mg at 09/11/15 1202  . magnesium hydroxide (MILK OF MAGNESIA) suspension 30 mL  30 mL Oral Daily PRN Kristeen Mans, NP      . multivitamin with minerals tablet 1 tablet  1 tablet Oral Daily Kristeen Mans, NP   1 tablet at 09/11/15 0739  . thiamine (VITAMIN B-1) tablet 100 mg  100 mg Oral Daily Kristeen Mans, NP   100 mg at 09/11/15 0739  . topiramate (TOPAMAX) tablet 50 mg  50 mg Oral QHS Rachael Fee, MD   50 mg at 09/10/15 2052  . traZODone (DESYREL) tablet 150 mg  150 mg Oral QHS Kristeen Mans, NP   150 mg at 09/10/15 2051    Lab Results: No results found for this or any previous visit (from the past 48 hour(s)).  Physical Findings: AIMS: Facial and Oral Movements Muscles of Facial Expression: None, normal Lips and Perioral Area: None, normal Jaw: None, normal Tongue: None, normal,Extremity Movements Upper (arms, wrists, hands, fingers): None,  normal Lower (legs, knees, ankles, toes): None, normal, Trunk Movements Neck, shoulders, hips: None, normal, Overall Severity Severity of abnormal movements (highest score from questions above): None, normal Incapacitation due to abnormal movements: None, normal Patient's awareness of abnormal movements (rate only patient's report): No Awareness, Dental Status Current problems with teeth and/or dentures?: No Does patient usually wear dentures?: No  CIWA:  CIWA-Ar Total: 2 COWS:     Musculoskeletal: Strength & Muscle Tone: within normal limits Gait & Station: normal Patient leans: normal  Psychiatric Specialty Exam: Review of Systems  Constitutional: Negative.   HENT: Negative.   Eyes: Negative.   Respiratory: Negative.   Cardiovascular: Negative.   Gastrointestinal: Negative.   Genitourinary: Negative.   Musculoskeletal: Negative.   Skin: Negative.   Neurological: Negative.   Endo/Heme/Allergies: Negative.   Psychiatric/Behavioral: Positive for depression and substance abuse. The patient is nervous/anxious.     Blood pressure 98/68, pulse 101, temperature 97.8 F (36.6 C), temperature source Oral, resp. rate 15, height  (1.651 m), weight 84.823 kg (187 lb), last menstrual period 08/24/2015.Body mass index is 31.12 kg/(m^2).  General Appearance: Fairly Groomed  Patent attorney::  Fair  Speech:  Clear and Coherent  Volume:  Decreased  Mood:  Anxious and Depressed  Affect:  Restricted  Thought Process:  Coherent and Goal Directed  Orientation:  Full (Time, Place, and Person)  Thought Content:  symptoms events worries concerns   Suicidal Thoughts:  No  Homicidal Thoughts:  No  Memory:  Immediate;   Fair Recent;   Fair Remote;   Fair  Judgement:  Fair  Insight:  Present and Shallow  Psychomotor Activity:  Decreased  Concentration:  Fair  Recall:  [name] of Knowledge:Fair  Language: Fair  Akathisia:  No  Handed:  Right  AIMS (if indicated):     Assets:  Desire  for Improvement  ADL's:  Intact  Cognition: WNL  Sleep:  Number of Hours: 7   Treatment Plan Summary: Daily contact with patient to assess and evaluate symptoms and progress in treatment and Medication management Supportive approach/coping skills Alcohol dependence: Renita Papa a relapse prevention plan Mood instability; continue the Depakote Depression ; continue the Wellbutrin SR 100 mg BID Nightmares; continue the Topamax 50 mg HS Insomnia; Trazodone 150 mg HS Work with CBT/mindfulness Facilitate admission to [facility] Zhaire Locker A, MD 09/11/2015, 4:00 PM

## 2015-09-11 NOTE — Progress Notes (Signed)
Recreation Therapy Notes  Date: 02.08.2017  Time: 9:30am Location: 300 Hall Group Room   Group Topic: Stress Management  Goal Area(s) Addresses:  Patient will actively participate in stress management techniques presented during session.   Behavioral Response: Did not attend.   Marykay Lex Corianna Avallone, LRT/CTRS  Katianna Mcclenney L 09/11/2015 2:35 PM

## 2015-09-11 NOTE — BHH Group Notes (Signed)
[facility] LCSW Group Therapy  09/11/2015 3:44 PM  Type of Therapy:  Group Therapy  Participation Level:  Did Not Attend-pt invited. Was resting in room.   Summary of Progress/Problems: Feelings around Relapse and Recovery  Smart, Windle Huebert LCSW 09/11/2015, 3:44 PM

## 2015-09-12 MED ORDER — HYDROXYZINE HCL 50 MG PO TABS: 50.0000 mg | ORAL_TABLET | Freq: Four times a day (QID) | ORAL | Status: AC | PRN

## 2015-09-12 MED ORDER — BUPROPION HCL 100 MG PO TABS: 100.0000 mg | ORAL_TABLET | Freq: Two times a day (BID) | ORAL | Status: AC

## 2015-09-12 MED ORDER — DIVALPROEX SODIUM ER 250 MG PO TB24: 750.0000 mg | ORAL_TABLET | Freq: Two times a day (BID) | ORAL | Status: AC

## 2015-09-12 MED ORDER — GABAPENTIN 100 MG PO CAPS: 100.0000 mg | ORAL_CAPSULE | Freq: Three times a day (TID) | ORAL | Status: AC

## 2015-09-12 MED ORDER — TOPIRAMATE 50 MG PO TABS: 50.0000 mg | ORAL_TABLET | Freq: Every day | ORAL | Status: AC

## 2015-09-12 MED ORDER — TRAZODONE HCL 150 MG PO TABS: 150.0000 mg | ORAL_TABLET | Freq: Every day | ORAL | Status: AC

## 2015-09-12 NOTE — Progress Notes (Signed)
Patient ID: Mary Harper, female   DOB: [birth year], 50 y.o.   MRN: [id number]   Pt currently presents with a anxious affect and cooperative behavior. Pt to be discharged tomorrow morning per MD. Pt reports that she feels better, brighter affect. Attends all programs on the hall today. Pt anxious about leaving but is taking an active role in her discharge planning.   Pt provided with medications per providers orders. Pt's labs and vitals were monitored throughout the day. Pt supported emotionally and encouraged to express concerns and questions. Pt educated on medications.  Pt's safety ensured with 15 minute and environmental checks. Pt currently denies SI/HI and A/V hallucinations. Pt verbally agrees to seek staff if SI/HI or A/VH occurs and to consult with staff before acting on these thoughts. Will continue POC.

## 2015-09-12 NOTE — BHH Group Notes (Signed)
[facility] Mental Health Association Group Therapy 09/12/2015 1:15pm  Type of Therapy: Mental Health Association Presentation  Participation Level: Active  Participation Quality: Attentive  Affect: Appropriate  Cognitive: Oriented  Insight: Developing/Improving  Engagement in Therapy: Engaged  Modes of Intervention: Discussion, Education and Socialization  Summary of Progress/Problems: Mental Health Association (MHA) Speaker came to talk about his personal journey with substance abuse and addiction. The pt processed ways by which to relate to the speaker. MHA speaker provided handouts and educational information pertaining to groups and services offered by the [facility]. Pt was engaged in speaker's presentation and was receptive to resources provided.    Chad Cordial, LCSWA 09/12/2015 1:53 PM

## 2015-09-12 NOTE — Progress Notes (Signed)
  [facility] Adult Case Management Discharge Plan :  Will you be returning to the same living situation after discharge:  No. Pt accepted to [facility] 2/10.  At discharge, do you have transportation home?: Yes,  Tonya, pt's case worker through [name] will pick up pt at 8:00AM on 09/13/15 Do you have the ability to pay for your medications: Yes,  Medicare  Release of information consent forms completed and submitted to medical records by CSW.  Patient to Follow up at: Follow-up Information    Follow up with [name].   Why:  ACT team is aware that you have been accepted to [facility]. Please follow-up with them prior to returning to [facility]. Thank you!   Contact information:   [address] [place], [place] [address] Phone: [phone] Fax: [phone]      Follow up with [facility]  On 09/13/2015.   Why:  You have been accepted to this facility on this date. Staff member will pick you up from [facility] Please call Shanda Bumps in admissions with any questions [phone]).    Contact information:   [address] [place], [place] [address] Phone: [phone] Fax: [phone]      Next level of care provider has access to [facility] Link:no  Safety Planning and Suicide Prevention discussed: Yes,  SPE completed with pt's cousin/legal guardian  Have you used any form of tobacco in the last 30 days? (Cigarettes, Smokeless Tobacco, Cigars, and/or Pipes): No  Has patient been referred to the Quitline?: N/A patient is not a smoker  Patient has been referred for addiction treatment: Yes  Smart, Dallyn Bergland LCSW 09/12/2015, 3:08 PM

## 2015-09-12 NOTE — Progress Notes (Signed)
Mary Harper states she is apprehensive about discharge. Describes her anxiety as "positive nervous energy". States "I am going to take it one day at a time". She rates her anxiety 6/10. Denies SI/HI/AVH. Encouragement and support given. Medications administered as prescribed. Continue to monitor Q 15 minutes for patient safety and medication effectiveness.

## 2015-09-12 NOTE — Progress Notes (Signed)
Patient ID: Mary Harper, female   DOB: 08/12/1965, 50 y.o.   MRN: [id number]  All papers and prescriptions were given reviewed with patient. Verbal understanding expressed. Pt given opportunity to express concerns and ask questions. Pt signed AVS. All papers placed on patients physical chart, including her debit and ID card.
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
## 2015-09-12 NOTE — Discharge Summary (Signed)
Physician Discharge Summary Note  Patient:  Mary Harper is an 50 y.o., female  MRN:  [id number]  DOB:  1966/06/14  Patient phone:  [phone] (home)   Patient address:   [address] [place] [address],   Total Time spent with patient: Greater than 30 minutes  Date of Admission:  09/08/2015  Date of Discharge: 09-13-15  Reason for Admission:  Worsening symptoms of bipolar disorder  Principal Problem: Alcohol dependence, binge pattern [facility])  Discharge Diagnoses: Patient Active Problem List   Diagnosis Date Noted  . PTSD (post-traumatic stress disorder) [F43.10] 03/12/2015  . Alcohol use disorder, severe, dependence (HCC) [F10.20] 12/02/2014  . Bipolar I disorder, most recent episode depressed (HCC) [F31.30] 12/02/2014  . Suicidal ideations [R45.851] 10/03/2014  . Alcohol dependence, binge pattern (HCC) [F10.20] 07/14/2014  . Bipolar affective disorder, depressed, severe (HCC) [F31.4] 07/13/2014  . Alcohol intoxication (HCC) [F10.129]    Musculoskeletal: Strength & Muscle Tone: within normal limits Gait & Station: normal Patient leans: N/A  Psychiatric Specialty Exam: Physical Exam  Vitals reviewed. Constitutional: She is oriented to person, place, and time. She appears well-developed.  HENT:  Head: Normocephalic.  Eyes: Pupils are equal, round, and reactive to light.  Neck: Normal range of motion.  Cardiovascular: Normal rate.   Respiratory: Effort normal.  GI: Soft.  Genitourinary:  Denies any issues in this area  Musculoskeletal: Normal range of motion.  Neurological: She is alert and oriented to person, place, and time.  Skin: Skin is warm and dry.  Psychiatric: Her mood appears not anxious. She is not slowed. She does not exhibit a depressed mood.    Review of Systems  Constitutional: Negative for fever.  Respiratory: Negative for cough.   Cardiovascular: Negative for chest pain.  Genitourinary: Negative for dysuria.  Skin: Negative  for rash.  Neurological: Negative for dizziness and headaches.  Psychiatric/Behavioral: Positive for depression (Stable) and substance abuse (Alcohol dependence). Negative for suicidal ideas, hallucinations and memory loss. The patient has insomnia (Stable). The patient is not nervous/anxious.     Blood pressure 101/83, pulse 104, temperature 97.5 F (36.4 C), temperature source Oral, resp. rate 16, height  (1.651 m), weight 84.823 kg (187 lb), last menstrual period 08/24/2015.Body mass index is 31.12 kg/(m^2).   See Md's SRA       Have you used any form of tobacco in the last 30 days? (Cigarettes, Smokeless Tobacco, Cigars, and/or Pipes): No  Has this patient used any form of tobacco in the last 30 days? (Cigarettes, Smokeless Tobacco, Cigars, and/or Pipes): N/A  Past Medical History:  Past Medical History  Diagnosis Date  . Depression   . Bipolar 1 disorder (HCC)   . Alcoholism (HCC)   . PTSD (post-traumatic stress disorder)   . Carpal tunnel syndrome on both sides   . Gastric bypass status for obesity   . Anxiety     Past Surgical History  Procedure Laterality Date  . Gastric bypass     Family History:  Family History  Problem Relation Age of Onset  . Alcoholism Other     many relatives on mother side with alcohol abuse issues   Social History:  History  Alcohol Use  . Yes    Comment: Binge Drinking      History  Drug Use No    Comment: binge drink about every 3 weeks     Social History   Social History  . Marital Status: Single    Spouse  Name: N/A  . Number of Children: N/A  . Years of Education: N/A   Social History Main Topics  . Smoking status: Never Smoker   . Smokeless tobacco: Never Used  . Alcohol Use: Yes     Comment: Binge Drinking   . Drug Use: No     Comment: binge drink about every 3 weeks   . Sexual Activity: Not Currently   Other Topics Concern  . None   Social History Narrative   ** Merged History Encounter **       Hospital  Course: Mry is a 50 year old Caucasian female with long history of alcohol use disorder (abuse/dependence), severe & Bipolar 1 disorder. She has been a patient in this hospital x numerous times all related to alcohol intoxications & threats of suicide. She was last discharged from this hospital on 08-09-2015 after receiving mood stabilization treatments. Adlean was discharged to follow-up care with the [facility].  During this current re-admission assessment, Alyda reports, "The ambulance took me to the [facility] ED yesterday. I called them because I was feeling like dying. I have been feeling like this for many years, it was just that the thoughts worsened in the last 4 days. I started drinking heavily again since last Tuesday, 5 days ago. I drank mainly wine. I was not drinking everyday, rather I'm a binge drinker. When I drink, I drink a lot of it. I started drinking again because I was feeling more & more depressed. I drink this much so I can sleep deep & not think about life. My longest sobriety is about 7 months, that was about 6 years ago. I have had substance abuse treatments in [place], several years ago & [facility] 2 years ago. The treatments helped for a while, will relapse due to my bad depression. I was diagnosed with Bipolar, manic depression 12 years ago. It is being treated, I feel better periodically, then, it worsens again. I have attempted suicide x several times in the past, my last one was in March of last year, 2016. I took an overdose of my medicines. I would rather go to a substance abuse treatment center this time after discharge".  Rejoice was discharged from this hospital on 08-09-15 after mood stabilization treatments. She was discharged to follow-up care with the [facility]. She was re-admitted to the hospital for alcohol detoxification treatment. Her blood alcohol level upon admission was 324 per toxicology tests reports. She was intoxicated. However, she  reported on admission that her suicidal symptoms that she has had for years worsened 4 days prior to this re-admission. As a result, she started to binge drink on alcohol.  Gwynevere's lab reports also indicated elevated liver enzymes (AST) possibly from chronic alcoholism. As a result, not a candidate for Librium detox protocols. This is because, Librium is a long acting Benzodiazepine with a long half-life. If used for this particular detox treatment will impose heavily on already compromised liver enzymes. Ativan detox protocols was used instead. By using ativan protocols, Lariyah received a cleaner detoxifcation treatment without the lingering adverse effects of the long acting Librium capsules.  Besides the detoxification treatment, Nahomi was resumed on all her pertinent home medications that she was discharged on her latest discharge. She will remain on; Wellbutrin 100 mg for depression, Depakote ER 250 mg for mood stabilization, Gabapentin 100 for mood stabilization, Hydroxyzine 50 mg for anxiety, Topamax 50 mg for mood stabilization & Trazodone 150  mg for insomnia. She tolerated her treatment regimen without any significant adverse effects and or reactions reported. Shanessa was enrolled & participated in the AA/NA meetings & group counseling sessions being offered and held on this unit. She learned coping skills.  Meredith has completed detox treatment & her mood is stable. She is being discharged to her home to continue treatment on an outpatient basis with her ACT Team. She also has been accepted to continue further substance abuse treatment at the [facility] in [place], [place]. She has been given all the necessary information needed to make this appointments without problems.    Upon discharge, Adriyana was both mentally and medically stable. She denies suicidal/homicidal ideations, auditory/visual/tactile hallucinations, delusional thoughts, paranoia or substance withdrawal symptoms. She received from  the pharmacy, a 7 days worth, supply samples of her [facility] discharge medications. She left [facility] with all belongings in no distress. Transportation per her case worker.  Risk to Self: Is patient at risk for suicide?: Yes Risk to Others: No Prior Inpatient Therapy: Yes Prior Outpatient Therapy: Yes  Level of Care:  OP  .     Consults:  psychiatry  Discharge Vitals:   Blood pressure 101/83, pulse 104, temperature 97.5 F (36.4 C), temperature source Oral, resp. rate 16, height 5\' 5"  (1.651 m), weight 84.823 kg (187 lb), last menstrual period 08/24/2015. Body mass index is 31.12 kg/(m^2).  Lab Results:   No results found for this or any previous visit (from the past 72 hour(s)). Physical Findings: AIMS: Facial and Oral Movements Muscles of Facial Expression: None, normal Lips and Perioral Area: None, normal Jaw: None, normal Tongue: None, normal,Extremity Movements Upper (arms, wrists, hands, fingers): None, normal Lower (legs, knees, ankles, toes): None, normal, Trunk Movements Neck, shoulders, hips: None, normal, Overall Severity Severity of abnormal movements (highest score from questions above): None, normal Incapacitation due to abnormal movements: None, normal Patient's awareness of abnormal movements (rate only patient's report): No Awareness, Dental Status Current problems with teeth and/or dentures?: No Does patient usually wear dentures?: No  CIWA:  CIWA-Ar Total: 3 COWS:     See Psychiatric Specialty Exam and Suicide Risk Assessment completed by Attending Physician prior to discharge.  Discharge destination:  Home  Is patient on multiple antipsychotic therapies at discharge:  No   Has Patient had three or more failed trials of antipsychotic monotherapy by history:  No  Recommended Plan for Multiple Antipsychotic Therapies: NA    Medication List    STOP taking these medications        LORazepam 1 MG tablet  Commonly known as:  ATIVAN     OLANZapine 10 MG tablet   Commonly known as:  ZYPREXA      TAKE these medications      Indication   buPROPion 100 MG tablet  Commonly known as:  WELLBUTRIN  Take 1 tablet (100 mg total) by mouth 2 (two) times daily. For depression   Indication:  Major Depressive Disorder     divalproex 250 MG 24 hr tablet  Commonly known as:  DEPAKOTE ER  Take 3 tablets (750 mg total) by mouth 2 (two) times daily. For mood stabilization   Indication:  Mood Stabilization     gabapentin 100 MG capsule  Commonly known as:  NEURONTIN  Take 1 capsule (100 mg total) by mouth 3 (three) times daily. For agitation   Indication:  Agitation     hydrOXYzine 50 MG tablet  Commonly known as:  ATARAX/VISTARIL  Take 1  tablet (50 mg total) by mouth every 6 (six) hours as needed for anxiety.   Indication:  Anxiety Neurosis     topiramate 50 MG tablet  Commonly known as:  TOPAMAX  Take 1 tablet (50 mg total) by mouth at bedtime. For mood stabilization   Indication:  Mood stabilization     traZODone 150 MG tablet  Commonly known as:  DESYREL  Take 1 tablet (150 mg total) by mouth at bedtime. For sleep   Indication:  Trouble Sleeping       Follow-up Information    Follow up with [name].   Why:  ACT team is aware that you have been accepted to [facility]. Please follow-up with them prior to returning to [facility]. Thank you!   Contact information:   [address] [place], [place] [address] Phone: [phone] Fax: [phone]      Follow up with [facility]  On 09/13/2015.   Why:  You have been accepted to this facility on this date. Staff member will pick you up from [facility]. Please call Shanda Bumps in admissions with any questions [phone]).    Contact information:   [address] [place], [place] [address] Phone: [phone] Fax: [phone]     Follow-up recommendations:  Activity:  As tolerated Diet: As recommended by your primary care  doctor. Keep all scheduled follow-up appointments as recommended.  Comments: Take all your medications as prescribed by your mental healthcare provider. Report any adverse effects and or reactions from your medicines to your outpatient provider promptly. Patient is instructed and cautioned to not engage in alcohol and or illegal drug use while on prescription medicines. In the event of worsening symptoms, patient is instructed to call the crisis hotline, 911 and or go to the nearest ED for appropriate evaluation and treatment of symptoms. Follow-up with your primary care provider for your other medical issues, concerns and or health care needs.    Total Discharge Time: Greater than 30 minutes  Signed: Armandina Stammer I PMHNP, FNP-BC 09/13/2015, 10:00 AM  I personally assessed the patient and formulated the plan Madie Reno A. Dub Mikes, M.D.

## 2015-09-12 NOTE — Progress Notes (Signed)
[facility] MD Progress Note  09/12/2015 5:36 PM Mary Harper  MRN:  [id number] Subjective:  Mary Harper states she is sleeping well without the Zyprexa. She is also not having the nightmares feels the Topamax is helping. She is looking forward to go to a long term program so she can continue to work on self and long term abstinence Principal Problem: Alcohol dependence, binge pattern (HCC) Diagnosis:   Patient Active Problem List   Diagnosis Date Noted  . Suicidal ideations [R45.851] 10/03/2014    Priority: High  . Bipolar affective disorder, depressed, severe (HCC) [F31.4] 07/13/2014    Priority: High  . PTSD (post-traumatic stress disorder) [F43.10] 03/12/2015  . Alcohol use disorder, severe, dependence (HCC) [F10.20] 12/02/2014  . Bipolar I disorder, most recent episode depressed (HCC) [F31.30] 12/02/2014  . Alcohol dependence, binge pattern (HCC) [F10.20] 07/14/2014  . Alcohol intoxication (HCC) [F10.129]    Total Time spent with patient: 15 minutes  Past Psychiatric History: see admission H and P  Past Medical History:  Past Medical History  Diagnosis Date  . Depression   . Bipolar 1 disorder (HCC)   . Alcoholism (HCC)   . PTSD (post-traumatic stress disorder)   . Carpal tunnel syndrome on both sides   . Gastric bypass status for obesity   . Anxiety     Past Surgical History  Procedure Laterality Date  . Gastric bypass     Family History:  Family History  Problem Relation Age of Onset  . Alcoholism Other     many relatives on mother side with alcohol abuse issues   Family Psychiatric  History: see admission H and P Social History:  History  Alcohol Use  . Yes    Comment: Binge Drinking      History  Drug Use No    Comment: binge drink about every 3 weeks     Social History   Social History  . Marital Status: Single    Spouse Name: N/A  . Number of Children: N/A  . Years of Education: N/A   Social History Main Topics  . Smoking status: Never Smoker   . Smokeless  tobacco: Never Used  . Alcohol Use: Yes     Comment: Binge Drinking   . Drug Use: No     Comment: binge drink about every 3 weeks   . Sexual Activity: Not Currently   Other Topics Concern  . None   Social History Narrative   ** Merged History Encounter **       Additional Social History:                         Sleep: Fair  Appetite:  Fair  Current Medications: Current Facility-Administered Medications  Medication Dose Route Frequency Provider Last Rate Last Dose  . acetaminophen (TYLENOL) tablet 650 mg  650 mg Oral Q6H PRN Kristeen Mans, NP   650 mg at 09/09/15 2123  . alum & mag hydroxide-simeth (MAALOX/MYLANTA) 200-200-20 MG/5ML suspension 30 mL  30 mL Oral Q4H PRN Kristeen Mans, NP      . buPROPion [facility]) tablet 100 mg  100 mg Oral BID Rachael Fee, MD   100 mg at 09/12/15 1609  . divalproex (DEPAKOTE ER) 24 hr tablet 750 mg  750 mg Oral BID Kristeen Mans, NP   750 mg at 09/12/15 0804  . gabapentin (NEURONTIN) capsule 100 mg  100 mg Oral TID Kristeen Mans, NP   100 mg  at 09/12/15 1609  . magnesium hydroxide (MILK OF MAGNESIA) suspension 30 mL  30 mL Oral Daily PRN Kristeen Mans, NP      . multivitamin with minerals tablet 1 tablet  1 tablet Oral Daily Kristeen Mans, NP   1 tablet at 09/12/15 0805  . thiamine (VITAMIN B-1) tablet 100 mg  100 mg Oral Daily Kristeen Mans, NP   100 mg at 09/12/15 0805  . topiramate (TOPAMAX) tablet 50 mg  50 mg Oral QHS Rachael Fee, MD   50 mg at 09/11/15 2136  . traZODone (DESYREL) tablet 150 mg  150 mg Oral QHS Kristeen Mans, NP   150 mg at 09/11/15 2136    Lab Results: No results found for this or any previous visit (from the past 48 hour(s)).  Physical Findings: AIMS: Facial and Oral Movements Muscles of Facial Expression: None, normal Lips and Perioral Area: None, normal Jaw: None, normal Tongue: None, normal,Extremity Movements Upper (arms, wrists, hands, fingers): None, normal Lower (legs, knees, ankles, toes):  None, normal, Trunk Movements Neck, shoulders, hips: None, normal, Overall Severity Severity of abnormal movements (highest score from questions above): None, normal Incapacitation due to abnormal movements: None, normal Patient's awareness of abnormal movements (rate only patient's report): No Awareness, Dental Status Current problems with teeth and/or dentures?: No Does patient usually wear dentures?: No  CIWA:  CIWA-Ar Total: 0 COWS:     Musculoskeletal: Strength & Muscle Tone: within normal limits Gait & Station: normal Patient leans: normal  Psychiatric Specialty Exam: Review of Systems  Constitutional: Negative.   HENT: Negative.   Eyes: Negative.   Respiratory: Negative.   Cardiovascular: Negative.   Gastrointestinal: Negative.   Genitourinary: Negative.   Musculoskeletal: Negative.   Skin: Negative.   Endo/Heme/Allergies: Negative.   Psychiatric/Behavioral: Positive for depression and substance abuse. The patient is nervous/anxious.     Blood pressure 98/79, pulse 103, temperature 97.7 F (36.5 C), temperature source Oral, resp. rate 16, height  (1.651 m), weight 84.823 kg (187 lb), last menstrual period 08/24/2015.Body mass index is 31.12 kg/(m^2).  General Appearance: Fairly Groomed  Patent attorney::  Fair  Speech:  Clear and Coherent  Volume:  Normal  Mood:  Anxious  Affect:  Appropriate  Thought Process:  Coherent and Goal Directed  Orientation:  Full (Time, Place, and Person)  Thought Content:  symptoms events   Suicidal Thoughts:  No  Homicidal Thoughts:  No  Memory:  Immediate;   Fair Recent;   Fair Remote;   Fair  Judgement:  Fair  Insight:  Present  Psychomotor Activity:  Normal  Concentration:  Fair  Recall:  [name] of Knowledge:Fair  Language: Fair  Akathisia:  No  Handed:  Right  AIMS (if indicated):     Assets:  Desire for Improvement  ADL's:  Intact  Cognition: WNL  Sleep:  Number of Hours: 7   Treatment Plan Summary: Daily  contact with patient to assess and evaluate symptoms and progress in treatment and Medication management  Supportive approach/coping skills Alcohol dependence; continue to work a relapse prevention plan Depression; continue the Wellbutrin SR 100 mg BID Mood instability; continue to work with the Depakote 750 mg Anxiety; continue to work with the Neurontin ( off label) Nightmares; continue to work with the Topamax (off label) Facilitate [facility] to [facility]  Ayrton Mcvay A, MD 09/12/2015, 5:36 PM

## 2015-09-12 NOTE — Progress Notes (Signed)
Adult Psychoeducational Group Note  Date:  09/12/2015 Time: 09:00am  Group Topic/Focus:  Self Care:   The focus of this group is to help patients understand the importance of self-care in order to improve or restore emotional, physical, spiritual, interpersonal, and financial health.  Participation Level:  Active  Participation Quality:  Appropriate  Affect:  Appropriate  Cognitive:  Alert and Appropriate  Insight: Appropriate  Engagement in Group:  Improving  Modes of Intervention:  Activity, Discussion, Education and Support  Additional Comments:  PT able to identify one aspect of self care to improve upon post discharge.   Aurora Mask 09/12/2015, 1:43 PM

## 2015-09-12 NOTE — [facility] Suicide Risk Assessment (Addendum)
[facility] INPATIENT:  Family/Significant Other Suicide Prevention Education  Suicide Prevention Education:  Contact Attempts: [name] (pt's cousin/guardian) [phone] has been identified by the patient as the family member/significant other with whom the patient will be residing, and identified as the person(s) who will aid the patient in the event of a mental health crisis.  With written consent from the patient, two attempts were made to provide suicide prevention education, prior to and/or following the patient's discharge.  We were unsuccessful in providing suicide prevention education.  A suicide education pamphlet was given to the patient to share with family/significant other.  Date and time of first attempt: 09/10/15 left message requesting call back  Date and time of second attempt: 09/12/15 left message requesting call back (multiple attempts made to reach pt's guardian today).   Smart, Aven Cegielski LCSW 09/12/2015, 3:07 PM   [name] called back. SPE completed. He also gave verbal consent for records to be sent to [name] and [facility] (he is physically unable to come to the hospital to sign). Witnessed by: Lamar Sprinkles -see release.   Trula Slade, MSW, LCSW Clinical Social Worker 09/12/2015 3:15 PM

## 2015-09-13 NOTE — Progress Notes (Signed)
Pt did not attend wrap up group. Pt was notified that group was beginning but remained in bed.

## 2015-09-13 NOTE — Progress Notes (Signed)
Pt was apprehensive about her discharge this am and flying on a plane. She began to cry and was consoled. Pt asked if she took a bendryl before getting on the plane would it be okay. Pt was given back all her belongings ./ her license and her credit card. [facility] picked her up and will sit with her at the airport. Pt denies Si or HI and did contract for safety. She was very pleasant and cooperative this am. All RX given and all am meds were administered.

## 2015-11-28 IMAGING — US US TRANSVAGINAL NON-OB
1 series · 3 of 3 positions shown · non-contrast
Comparison: None

CLINICAL DATA: Cervical mass on exam, vaginal bleeding



[Series 1: us transvaginal non-ob · 0.11mm/px · 3 of 3 slices shown]
[im 1/3]
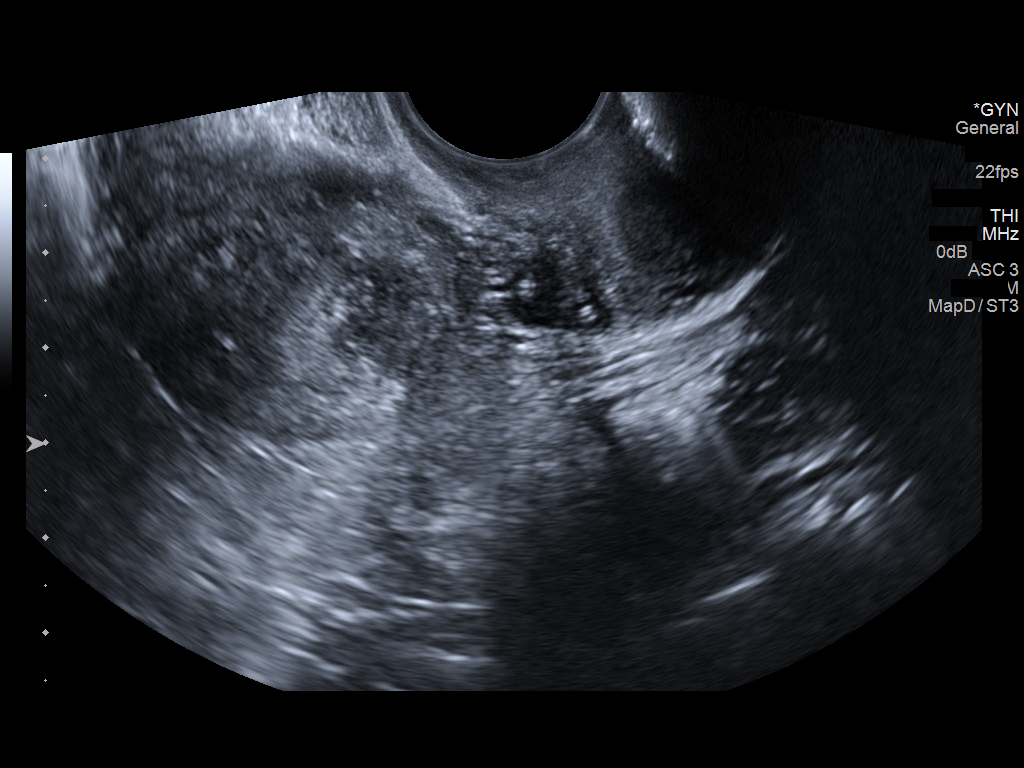
[im 2/3]
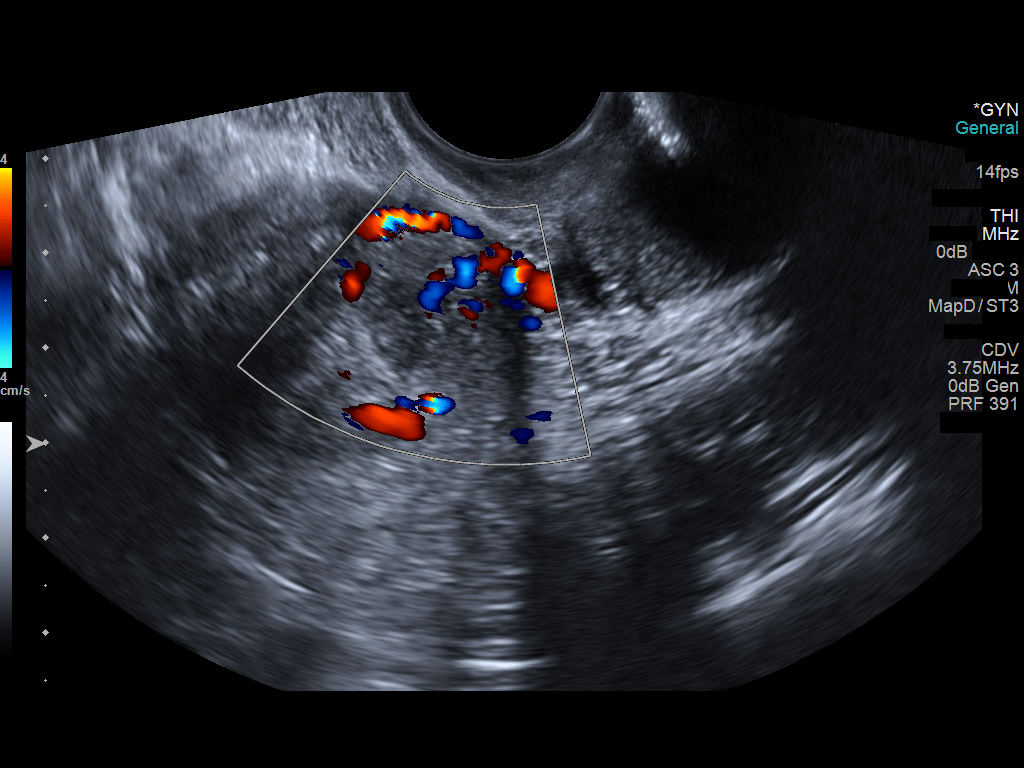
[im 3/3]
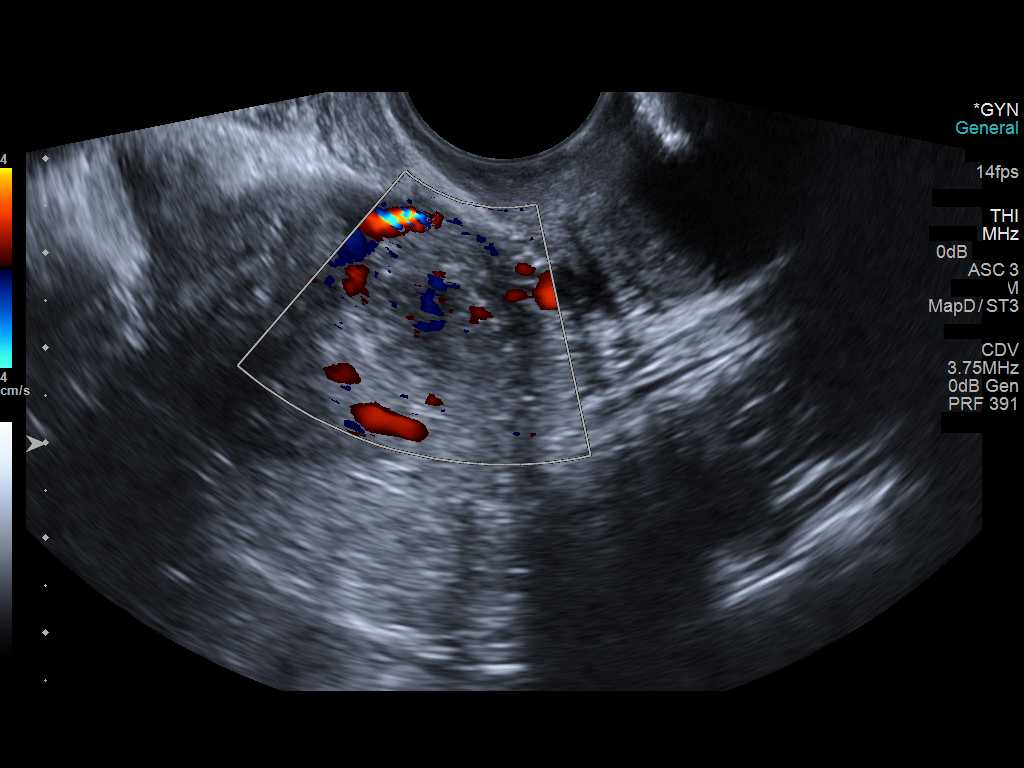

[3 of 3 positions shown; findings below may reference images not displayed]

FINDINGS: Uterus

Measurements: 7.2 x 3.7 x 5.4 cm. Suspected small right-sided mid
uterine leiomyoma 1.7 x 1.1 x 1.8 cm.

Endometrium

Thickness: 3 mm thick.  Tiny nabothian cysts at cervix.

Right ovary

Measurements: 1.8 x 2.4 x 1.8 cm. Normal morphology without mass.

Left ovary

Measurements: 1.4 x 1.8 x 1.4 cm. Normal morphology without mass.

Other findings

No free pelvic fluid or adnexal masses.
IMPRESSION: Probable tiny uterine leiomyoma 1.8 cm greatest size.

Otherwise negative exam.

## 2016-07-13 IMAGING — CT CT HEAD W/O CM
1 series · 16 of 30 positions shown, 20 images · non-contrast
Comparison: Head CT 03/11/2014

CLINICAL DATA: Headaches, 2 weeks duration. Memory deficit over the
last 3 years. Bipolar.

EXAM:
CT HEAD WITHOUT CONTRAST
TECHNIQUE: Contiguous axial images were obtained from the base of the skull
through the vertex without intravenous contrast.

[Series 2: headseq 4.8 h45s · axial · 0.43mm/px · z∈[+1243,+1398]mm · 16 of 36 slices shown, 20 images]
[im 2/36  brain]
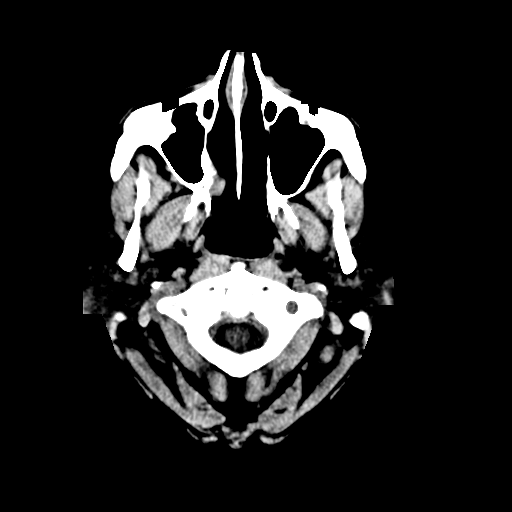
[im 2/36  bone]
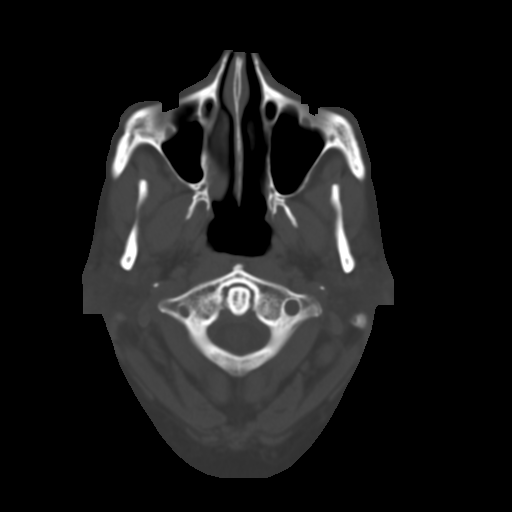
[im 4/36  brain]
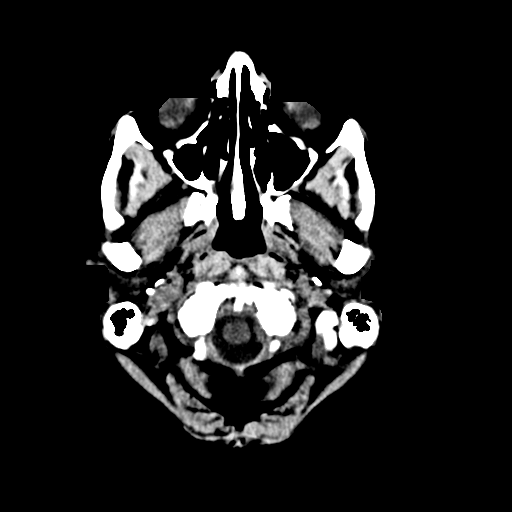
[im 7/36  brain]
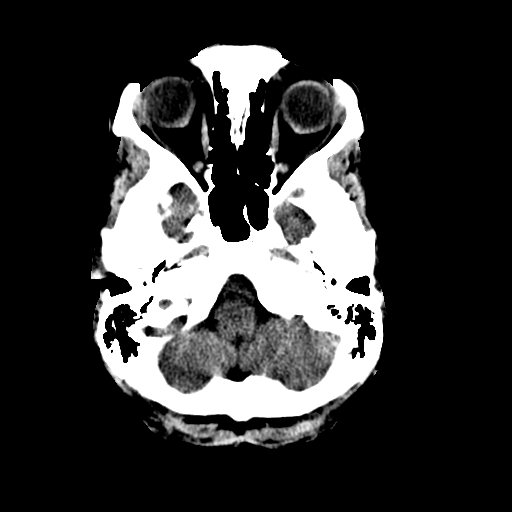
[im 9/36  brain]
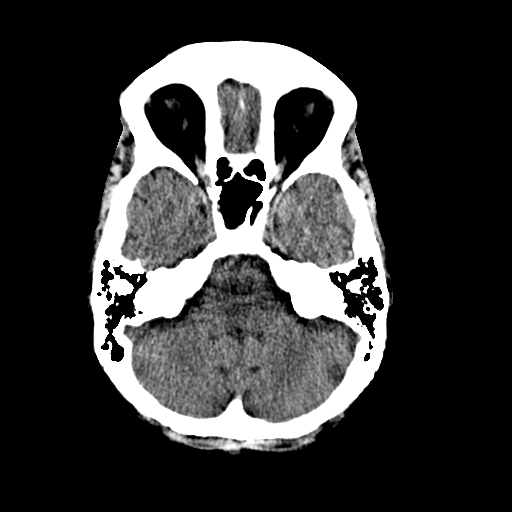
[im 10/36  brain]
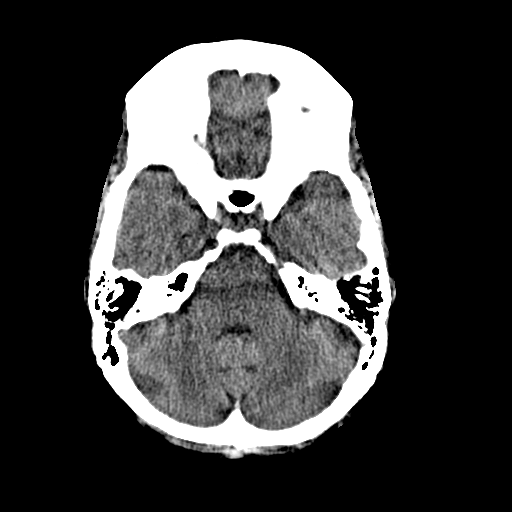
[im 10/36  bone]
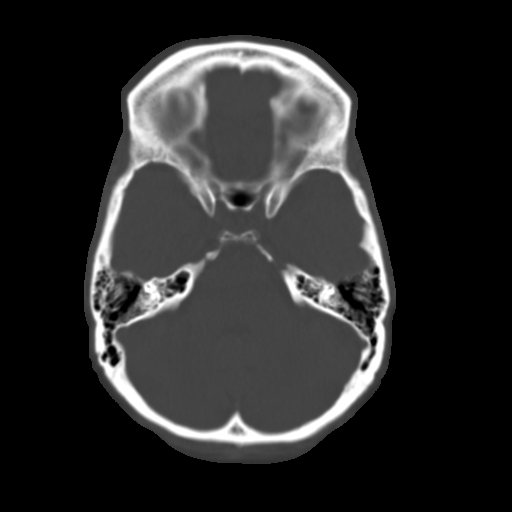
[im 13/36  brain]
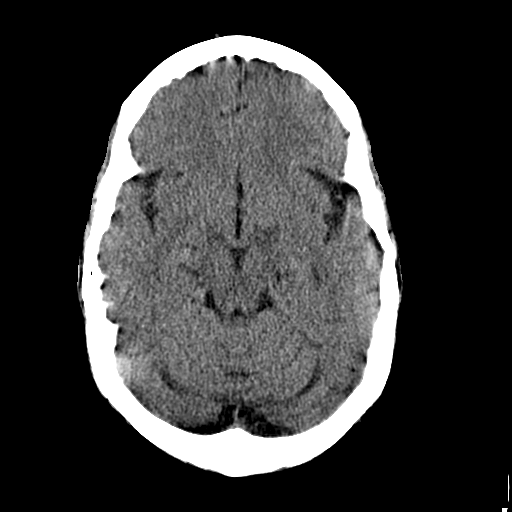
[im 15/36  brain]
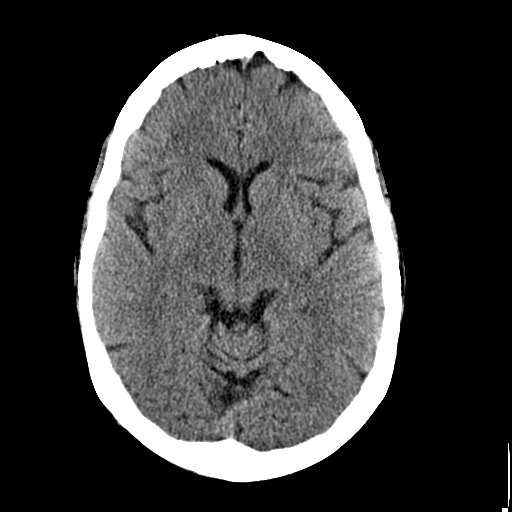
[im 17/36  brain]
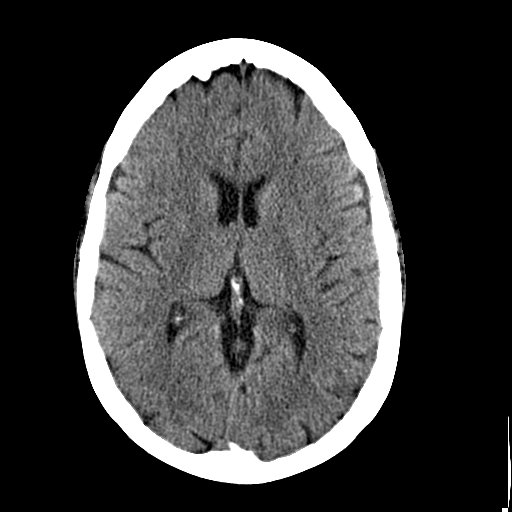
[im 19/36  brain]
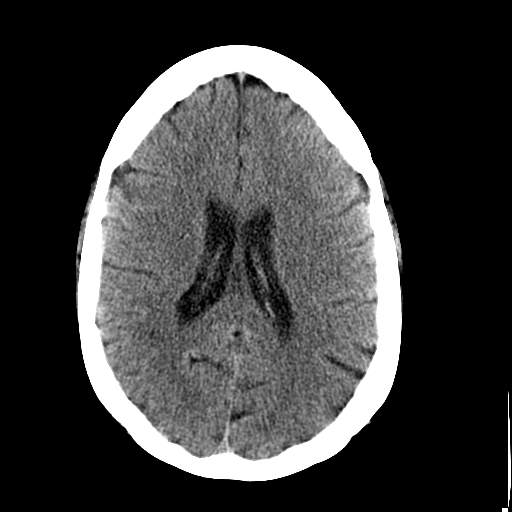
[im 19/36  bone]
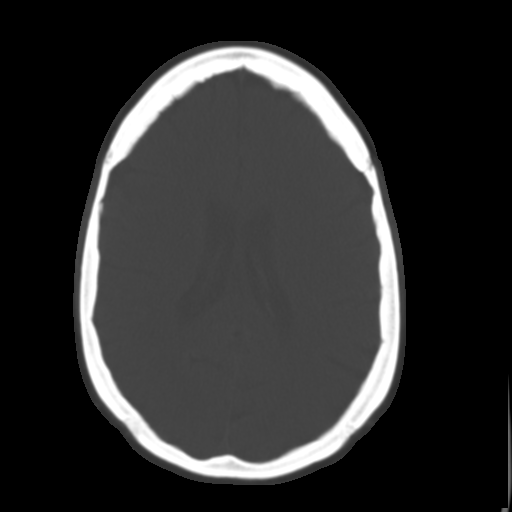
[im 21/36  brain]
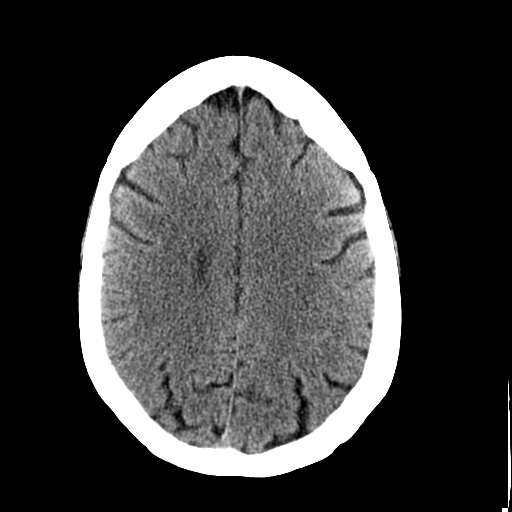
[im 23/36  brain]
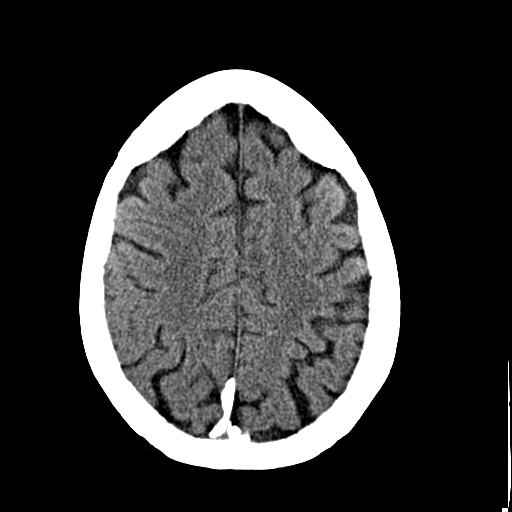
[im 26/36  brain]
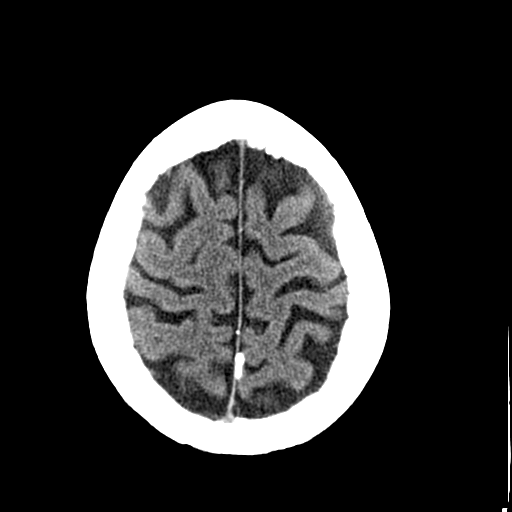
[im 27/36  brain]
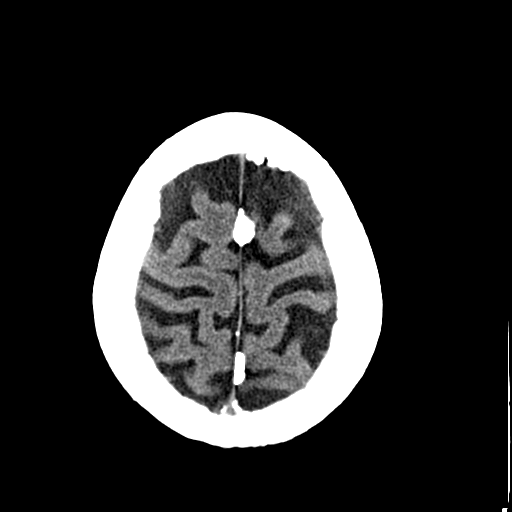
[im 27/36  bone]
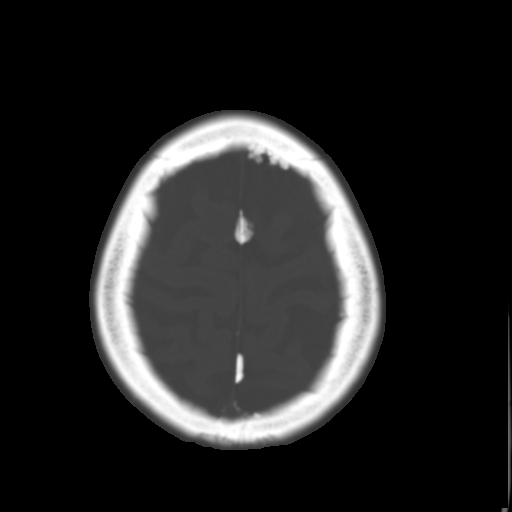
[im 29/36  brain]
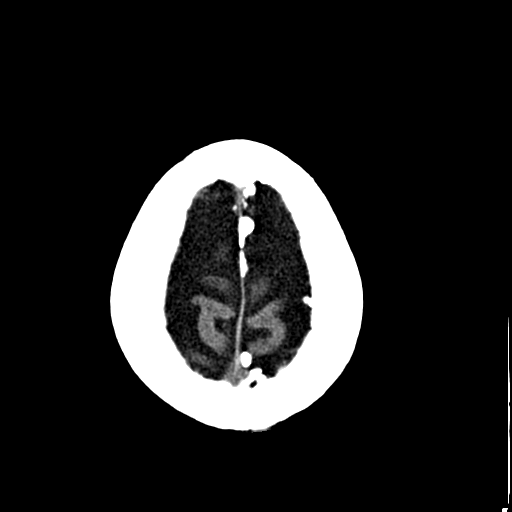
[im 32/36  brain]
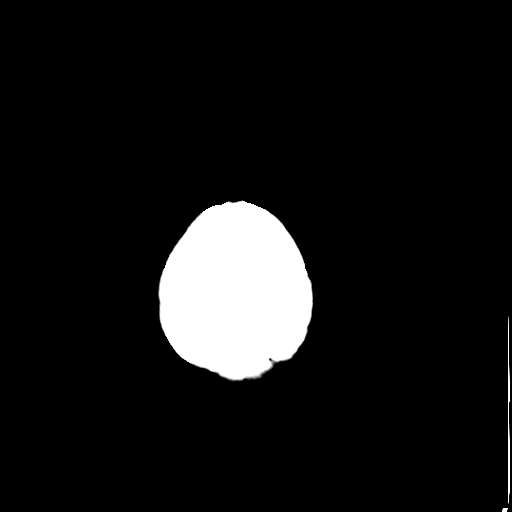
[im 34/36  brain]
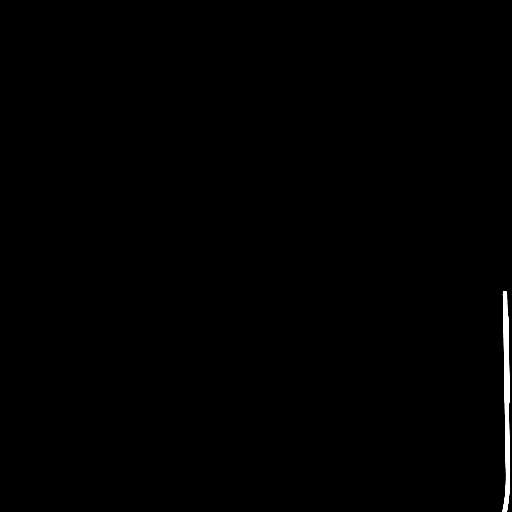

[16 of 30 positions shown; findings below may reference images not displayed]

FINDINGS: The brain has a normal appearance without evidence of atrophy,
infarction, mass lesion, hemorrhage, hydrocephalus or extra-axial
collection. The calvarium is unremarkable. The paranasal sinuses,
middle ears and mastoids are clear.
IMPRESSION: Normal head CT

## 2019-02-09 ENCOUNTER — Encounter: Primary: Family Medicine

## 2019-02-16 ENCOUNTER — Encounter: Attending: Internal Medicine | Primary: Family Medicine

## 2019-02-27 ENCOUNTER — Telehealth: Admit: 2019-02-27 | Payer: PRIVATE HEALTH INSURANCE | Attending: Internal Medicine | Primary: Family Medicine

## 2019-02-27 NOTE — Progress Notes (Signed)
This encounter was created in error - please disregard.
